# Patient Record
Sex: Female | Born: 1945
Health system: Southern US, Community
[De-identification: ages and names within clinical notes are randomized; demographics above are authoritative.]

## PROBLEM LIST (undated history)

## (undated) DIAGNOSIS — D229 Melanocytic nevi, unspecified: Secondary | ICD-10-CM

## (undated) DIAGNOSIS — C569 Malignant neoplasm of unspecified ovary: Secondary | ICD-10-CM

## (undated) DIAGNOSIS — C4492 Squamous cell carcinoma of skin, unspecified: Secondary | ICD-10-CM

## (undated) DIAGNOSIS — G589 Mononeuropathy, unspecified: Secondary | ICD-10-CM

## (undated) DIAGNOSIS — C4491 Basal cell carcinoma of skin, unspecified: Secondary | ICD-10-CM

## (undated) DIAGNOSIS — I509 Heart failure, unspecified: Secondary | ICD-10-CM

## (undated) DIAGNOSIS — R0600 Dyspnea, unspecified: Secondary | ICD-10-CM

## (undated) DIAGNOSIS — D040 Carcinoma in situ of skin of lip: Secondary | ICD-10-CM

## (undated) DIAGNOSIS — M8580 Other specified disorders of bone density and structure, unspecified site: Secondary | ICD-10-CM

## (undated) DIAGNOSIS — I2510 Atherosclerotic heart disease of native coronary artery without angina pectoris: Secondary | ICD-10-CM

## (undated) HISTORY — PX: OOPHORECTOMY: SHX86

## (undated) HISTORY — DX: Mononeuropathy, unspecified: G58.9

## (undated) HISTORY — DX: Malignant neoplasm of unspecified ovary: C56.9

## (undated) HISTORY — DX: Other specified disorders of bone density and structure, unspecified site: M85.80

## (undated) HISTORY — PX: OTHER SURGICAL HISTORY: SHX169

## (undated) HISTORY — DX: Squamous cell carcinoma of skin, unspecified: C44.92

## (undated) SURGERY — BRONCHOSCOPY, WITH FLUOROSCOPY
Anesthesia: Moderate Sedation

---
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
## 1988-07-23 DIAGNOSIS — C569 Malignant neoplasm of unspecified ovary: Secondary | ICD-10-CM

## 1988-07-23 HISTORY — PX: ABDOMINAL HYSTERECTOMY: SHX81

## 1988-07-23 HISTORY — DX: Malignant neoplasm of unspecified ovary: C56.9

## 1996-05-05 DIAGNOSIS — C4492 Squamous cell carcinoma of skin, unspecified: Secondary | ICD-10-CM

## 1996-05-05 DIAGNOSIS — C4491 Basal cell carcinoma of skin, unspecified: Secondary | ICD-10-CM

## 1996-05-05 HISTORY — DX: Basal cell carcinoma of skin, unspecified: C44.91

## 1996-05-05 HISTORY — DX: Squamous cell carcinoma of skin, unspecified: C44.92

## 1997-01-05 DIAGNOSIS — D229 Melanocytic nevi, unspecified: Secondary | ICD-10-CM

## 1997-01-05 HISTORY — DX: Melanocytic nevi, unspecified: D22.9

## 1999-09-11 ENCOUNTER — Other Ambulatory Visit: Admission: RE | Admit: 1999-09-11 | Discharge: 1999-09-11 | Payer: Self-pay | Admitting: Obstetrics and Gynecology

## 1999-10-12 ENCOUNTER — Encounter: Payer: Self-pay | Admitting: Obstetrics and Gynecology

## 1999-10-12 ENCOUNTER — Encounter: Admission: RE | Admit: 1999-10-12 | Discharge: 1999-10-12 | Payer: Self-pay | Admitting: Obstetrics and Gynecology

## 2000-07-09 ENCOUNTER — Encounter: Admission: RE | Admit: 2000-07-09 | Discharge: 2000-07-09 | Payer: Self-pay

## 2000-09-20 ENCOUNTER — Other Ambulatory Visit: Admission: RE | Admit: 2000-09-20 | Discharge: 2000-09-20 | Payer: Self-pay | Admitting: Obstetrics and Gynecology

## 2000-10-08 ENCOUNTER — Encounter: Admission: RE | Admit: 2000-10-08 | Discharge: 2000-10-08 | Payer: Self-pay | Admitting: Obstetrics and Gynecology

## 2000-10-08 ENCOUNTER — Encounter: Payer: Self-pay | Admitting: Obstetrics and Gynecology

## 2000-10-14 ENCOUNTER — Encounter: Admission: RE | Admit: 2000-10-14 | Discharge: 2000-10-14 | Payer: Self-pay | Admitting: Obstetrics and Gynecology

## 2000-10-14 ENCOUNTER — Encounter: Payer: Self-pay | Admitting: Obstetrics and Gynecology

## 2001-09-22 ENCOUNTER — Other Ambulatory Visit: Admission: RE | Admit: 2001-09-22 | Discharge: 2001-09-22 | Payer: Self-pay | Admitting: Obstetrics and Gynecology

## 2001-10-15 ENCOUNTER — Encounter: Payer: Self-pay | Admitting: Obstetrics and Gynecology

## 2001-10-15 ENCOUNTER — Encounter: Admission: RE | Admit: 2001-10-15 | Discharge: 2001-10-15 | Payer: Self-pay | Admitting: Obstetrics and Gynecology

## 2001-10-17 ENCOUNTER — Encounter: Admission: RE | Admit: 2001-10-17 | Discharge: 2001-10-17 | Payer: Self-pay | Admitting: Obstetrics and Gynecology

## 2001-10-17 ENCOUNTER — Encounter: Payer: Self-pay | Admitting: Obstetrics and Gynecology

## 2002-04-14 ENCOUNTER — Encounter: Admission: RE | Admit: 2002-04-14 | Discharge: 2002-04-14 | Payer: Self-pay | Admitting: Obstetrics and Gynecology

## 2002-04-14 ENCOUNTER — Encounter: Payer: Self-pay | Admitting: Obstetrics and Gynecology

## 2002-09-29 ENCOUNTER — Other Ambulatory Visit: Admission: RE | Admit: 2002-09-29 | Discharge: 2002-09-29 | Payer: Self-pay | Admitting: Obstetrics and Gynecology

## 2002-11-13 ENCOUNTER — Encounter: Payer: Self-pay | Admitting: Obstetrics and Gynecology

## 2002-11-13 ENCOUNTER — Encounter: Admission: RE | Admit: 2002-11-13 | Discharge: 2002-11-13 | Payer: Self-pay | Admitting: Obstetrics and Gynecology

## 2002-11-27 ENCOUNTER — Encounter: Admission: RE | Admit: 2002-11-27 | Discharge: 2002-11-27 | Payer: Self-pay | Admitting: Obstetrics and Gynecology

## 2002-11-27 ENCOUNTER — Encounter (INDEPENDENT_AMBULATORY_CARE_PROVIDER_SITE_OTHER): Payer: Self-pay

## 2002-11-27 ENCOUNTER — Encounter: Payer: Self-pay | Admitting: Obstetrics and Gynecology

## 2002-11-27 HISTORY — PX: BREAST BIOPSY: SHX20

## 2003-08-17 DIAGNOSIS — C4492 Squamous cell carcinoma of skin, unspecified: Secondary | ICD-10-CM

## 2003-08-17 HISTORY — DX: Squamous cell carcinoma of skin, unspecified: C44.92

## 2003-08-20 ENCOUNTER — Ambulatory Visit (HOSPITAL_COMMUNITY): Admission: RE | Admit: 2003-08-20 | Discharge: 2003-08-20 | Payer: Self-pay | Admitting: Gastroenterology

## 2003-09-29 ENCOUNTER — Other Ambulatory Visit: Admission: RE | Admit: 2003-09-29 | Discharge: 2003-09-29 | Payer: Self-pay | Admitting: Obstetrics and Gynecology

## 2003-11-18 ENCOUNTER — Encounter: Admission: RE | Admit: 2003-11-18 | Discharge: 2003-11-18 | Payer: Self-pay | Admitting: Obstetrics and Gynecology

## 2003-11-25 ENCOUNTER — Encounter: Admission: RE | Admit: 2003-11-25 | Discharge: 2003-11-25 | Payer: Self-pay | Admitting: Obstetrics and Gynecology

## 2003-12-06 ENCOUNTER — Encounter (HOSPITAL_COMMUNITY): Admission: RE | Admit: 2003-12-06 | Discharge: 2004-03-05 | Payer: Self-pay | Admitting: Obstetrics and Gynecology

## 2004-03-17 DIAGNOSIS — C4491 Basal cell carcinoma of skin, unspecified: Secondary | ICD-10-CM

## 2004-03-17 HISTORY — DX: Basal cell carcinoma of skin, unspecified: C44.91

## 2004-10-02 ENCOUNTER — Other Ambulatory Visit: Admission: RE | Admit: 2004-10-02 | Discharge: 2004-10-02 | Payer: Self-pay | Admitting: Obstetrics and Gynecology

## 2004-11-24 ENCOUNTER — Encounter: Admission: RE | Admit: 2004-11-24 | Discharge: 2004-11-24 | Payer: Self-pay | Admitting: Obstetrics and Gynecology

## 2005-10-08 ENCOUNTER — Other Ambulatory Visit: Admission: RE | Admit: 2005-10-08 | Discharge: 2005-10-08 | Payer: Self-pay | Admitting: Obstetrics and Gynecology

## 2005-11-08 ENCOUNTER — Other Ambulatory Visit: Admission: RE | Admit: 2005-11-08 | Discharge: 2005-11-08 | Payer: Self-pay | Admitting: Ophthalmology

## 2005-11-26 ENCOUNTER — Encounter: Admission: RE | Admit: 2005-11-26 | Discharge: 2005-11-26 | Payer: Self-pay | Admitting: Obstetrics and Gynecology

## 2005-11-28 ENCOUNTER — Encounter: Admission: RE | Admit: 2005-11-28 | Discharge: 2005-11-28 | Payer: Self-pay | Admitting: Obstetrics and Gynecology

## 2006-01-01 ENCOUNTER — Ambulatory Visit (HOSPITAL_COMMUNITY): Admission: RE | Admit: 2006-01-01 | Discharge: 2006-01-01 | Payer: Self-pay | Admitting: Obstetrics and Gynecology

## 2006-10-17 ENCOUNTER — Other Ambulatory Visit: Admission: RE | Admit: 2006-10-17 | Discharge: 2006-10-17 | Payer: Self-pay | Admitting: Obstetrics and Gynecology

## 2006-11-28 ENCOUNTER — Encounter: Admission: RE | Admit: 2006-11-28 | Discharge: 2006-11-28 | Payer: Self-pay | Admitting: Obstetrics and Gynecology

## 2007-10-21 ENCOUNTER — Other Ambulatory Visit: Admission: RE | Admit: 2007-10-21 | Discharge: 2007-10-21 | Payer: Self-pay | Admitting: Obstetrics and Gynecology

## 2007-12-08 ENCOUNTER — Encounter: Admission: RE | Admit: 2007-12-08 | Discharge: 2007-12-08 | Payer: Self-pay | Admitting: Obstetrics and Gynecology

## 2008-10-26 ENCOUNTER — Other Ambulatory Visit: Admission: RE | Admit: 2008-10-26 | Discharge: 2008-10-26 | Payer: Self-pay | Admitting: Obstetrics and Gynecology

## 2008-10-26 ENCOUNTER — Ambulatory Visit: Payer: Self-pay | Admitting: Obstetrics and Gynecology

## 2008-10-26 ENCOUNTER — Encounter: Payer: Self-pay | Admitting: Obstetrics and Gynecology

## 2008-11-30 ENCOUNTER — Encounter (INDEPENDENT_AMBULATORY_CARE_PROVIDER_SITE_OTHER): Payer: Self-pay | Admitting: Gastroenterology

## 2008-11-30 ENCOUNTER — Ambulatory Visit (HOSPITAL_COMMUNITY): Admission: RE | Admit: 2008-11-30 | Discharge: 2008-11-30 | Payer: Self-pay | Admitting: Gastroenterology

## 2008-12-08 ENCOUNTER — Encounter: Admission: RE | Admit: 2008-12-08 | Discharge: 2008-12-08 | Payer: Self-pay | Admitting: Obstetrics and Gynecology

## 2009-01-07 ENCOUNTER — Ambulatory Visit (HOSPITAL_COMMUNITY): Admission: RE | Admit: 2009-01-07 | Discharge: 2009-01-07 | Payer: Self-pay | Admitting: Obstetrics and Gynecology

## 2009-01-12 ENCOUNTER — Ambulatory Visit (HOSPITAL_COMMUNITY): Admission: RE | Admit: 2009-01-12 | Discharge: 2009-01-12 | Payer: Self-pay | Admitting: Obstetrics and Gynecology

## 2009-01-14 ENCOUNTER — Ambulatory Visit (HOSPITAL_COMMUNITY): Admission: RE | Admit: 2009-01-14 | Discharge: 2009-01-14 | Payer: Self-pay | Admitting: Obstetrics and Gynecology

## 2009-10-31 ENCOUNTER — Ambulatory Visit: Payer: Self-pay | Admitting: Obstetrics and Gynecology

## 2009-10-31 ENCOUNTER — Other Ambulatory Visit: Admission: RE | Admit: 2009-10-31 | Discharge: 2009-10-31 | Payer: Self-pay | Admitting: Obstetrics and Gynecology

## 2009-12-09 ENCOUNTER — Encounter: Admission: RE | Admit: 2009-12-09 | Discharge: 2009-12-09 | Payer: Self-pay | Admitting: Obstetrics and Gynecology

## 2010-07-10 ENCOUNTER — Encounter
Admission: RE | Admit: 2010-07-10 | Discharge: 2010-07-10 | Payer: Self-pay | Source: Home / Self Care | Attending: Internal Medicine | Admitting: Internal Medicine

## 2010-10-24 ENCOUNTER — Other Ambulatory Visit: Payer: Self-pay | Admitting: Obstetrics and Gynecology

## 2010-10-24 DIAGNOSIS — Z1231 Encounter for screening mammogram for malignant neoplasm of breast: Secondary | ICD-10-CM

## 2010-10-30 LAB — CREATININE, SERUM
Creatinine, Ser: 0.84 mg/dL (ref 0.4–1.2)
GFR calc Af Amer: >60 mL/min (ref >60)
GFR calc non Af Amer: >60 mL/min (ref >60)

## 2010-11-02 ENCOUNTER — Encounter (INDEPENDENT_AMBULATORY_CARE_PROVIDER_SITE_OTHER): Payer: 59 | Admitting: Obstetrics and Gynecology

## 2010-11-02 ENCOUNTER — Other Ambulatory Visit: Payer: Self-pay | Admitting: Obstetrics and Gynecology

## 2010-11-02 ENCOUNTER — Other Ambulatory Visit (HOSPITAL_COMMUNITY)
Admission: RE | Admit: 2010-11-02 | Discharge: 2010-11-02 | Disposition: A | Discharge status: Home / Self Care | Payer: 59 | Source: Ambulatory Visit | Attending: Obstetrics and Gynecology | Admitting: Obstetrics and Gynecology

## 2010-11-02 DIAGNOSIS — M8580 Other specified disorders of bone density and structure, unspecified site: Secondary | ICD-10-CM

## 2010-11-02 DIAGNOSIS — Z01419 Encounter for gynecological examination (general) (routine) without abnormal findings: Secondary | ICD-10-CM

## 2010-11-02 DIAGNOSIS — C569 Malignant neoplasm of unspecified ovary: Secondary | ICD-10-CM

## 2010-11-02 DIAGNOSIS — Z124 Encounter for screening for malignant neoplasm of cervix: Secondary | ICD-10-CM | POA: Insufficient documentation

## 2010-12-05 NOTE — Op Note (Signed)
Barbara Schwartz, Barbara Schwartz              ACCOUNT NO.:  [id number]   MEDICAL RECORD NO.:  [id number]          PATIENT TYPE:  AMB   LOCATION:  ENDO                         FACILITY:  [facility]   PHYSICIAN:  Petra Kuba, M.D.    DATE OF BIRTH:  Aug 26, 1945   DATE OF PROCEDURE:  11/30/2008  DATE OF DISCHARGE:                               OPERATIVE REPORT   PROCEDURE:  Colonoscopy with biopsy.   INDICATIONS:  Patient with ovarian cancer due for colonic screening.  Consent was signed after risks, benefits, methods, options thoroughly  discussed multiple times in the past.   MEDICINES USED:  Fentanyl 75 mcg, Versed 7.5 mg.   PROCEDURE:  Rectal inspection is pertinent for small external  hemorrhoids.  Digital exam was negative.  The video pediatric  colonoscope was inserted.  We fairly easily advanced around the colon to  the cecum.  This did require some abdominal pressure but no position  changes.  No abnormality was seen on insertion.  Cecum was identified by  the appendiceal orifice and ileocecal valve.  The scope was slowly  withdrawn.  The prep was adequate.  There was some liquid stool that  required washing and suctioning.  On slow withdrawal through the colon,  a tiny mid ascending and mid descending polyp was seen and was cold  biopsied x2 and put in the same container.  No other abnormalities were  seen as we slowly withdrew back to the rectum.  Once back in the rectum,  anorectal pull-through and retroflexion confirmed some small  hemorrhoids.  Scope was straightened and readvanced a short ways up the  left side of the colon.  Air was suctioned, scope removed.  The patient  tolerated the procedure well.  There was no obvious immediate  complication.   ENDOSCOPIC DIAGNOSES:  1. Internal-external hemorrhoids.  2. Two tiny descending and ascending polyps status post cold biopsy.  3. Otherwise within normal limits to the cecum.   PLANS:  Await pathology.  Followup p.r.n.  Probably  repeat colon  screening in 5 years.           ______________________________  Petra Kuba, M.D.     MEM/MEDQ  D:  11/30/2008  T:  11/30/2008  Job:  [id number]   cc:   Reuel Boom L. Eda Paschal, M.D.  Fax: [phone]   Deirdre Peer. Polite, M.D.

## 2010-12-08 NOTE — Op Note (Signed)
NAME:  Barbara Schwartz, Barbara Schwartz                        ACCOUNT NO.:  [id number]   MEDICAL RECORD NO.:  [id number]                   PATIENT TYPE:  AMB   LOCATION:  ENDO                                 FACILITY:  [facility]   PHYSICIAN:  Petra Kuba, M.D.                 DATE OF BIRTH:  13-May-1946   DATE OF PROCEDURE:  08/20/2003  DATE OF DISCHARGE:                                 OPERATIVE REPORT   PROCEDURE:  Colonoscopy.   INDICATIONS:  A patient with a history of ovarian cancer at a young age, as  well as a family history of both ovarian and breast cancer.  Due for colonic  screening.  Consent was signed after risks, benefits, methods and options  were thoroughly discussed in the office.   MEDICATIONS USED:  1. Demerol 40 mg .  2. Versed 5 mg.   DESCRIPTION OF PROCEDURE:  Rectal inspection is pertinent for small external  hemorrhoids.  Digital examination was negative.  Pediatric video adjustable  colonoscope was inserted and fairly easily advanced around the colon to the  cecum.  This did require some abdominal pressure with no positional changes.  No obvious abnormality was seen on insertion.  The cecum was identified by  the appendiceal orifice and the ileocecal valve.  The scope was inserted a  short way in the terminal ileum, which was normal.  Photo documentation was  obtained.  The scope was slowly withdrawn.   The prep was adequate.  There was some liquid stool that required washing  and suctioning on slow withdrawal through the colon.  No abnormalities were  noted, specifically no polyps, tumors, masses, diverticula or other  abnormalities.  Once back in the rectum, anorectal pullback in retroflexion  confirmed some small hemorrhoids.  The scope was straightened and readvanced  a short way up the left side of the colon.  Air was suctioned and scope  removed.  The patient tolerated the procedure well, there were no obvious  immediate complication.   ENDOSCOPIC DIAGNOSES:  1.  Small internal hemorrhoids.  2. Otherwise within normal limits to the cecum and terminal ileum.   PLAN:  Daily rectals and guaiacs per Dr. Lovell Sheehan.  Happy to see him back  p.r.n.; otherwise repeat colonic screening in five years.  Consideration of  rectal colonoscopy widely available at this junction.                                               Petra Kuba, M.D.    MEM/MEDQ  D:  08/20/2003  T:  08/20/2003  Job:  [id number]   cc:   Lilla Shook, M.D.  [address]  [place]  [place] [address]  Fax: [phone]   Sigmund I. Patsi Sears, M.D.  [address]  [place]  [place] [address]  Fax: [phone]

## 2010-12-09 ENCOUNTER — Inpatient Hospital Stay (INDEPENDENT_AMBULATORY_CARE_PROVIDER_SITE_OTHER)
Admission: RE | Admit: 2010-12-09 | Discharge: 2010-12-09 | Disposition: A | Discharge status: Home / Self Care | Payer: 59 | Source: Ambulatory Visit | Attending: Family Medicine | Admitting: Family Medicine

## 2010-12-09 DIAGNOSIS — S0510XA Contusion of eyeball and orbital tissues, unspecified eye, initial encounter: Secondary | ICD-10-CM

## 2010-12-11 ENCOUNTER — Ambulatory Visit
Admission: RE | Admit: 2010-12-11 | Discharge: 2010-12-11 | Disposition: A | Discharge status: Home / Self Care | Payer: 59 | Source: Ambulatory Visit | Attending: Obstetrics and Gynecology | Admitting: Obstetrics and Gynecology

## 2010-12-11 DIAGNOSIS — Z1231 Encounter for screening mammogram for malignant neoplasm of breast: Secondary | ICD-10-CM

## 2010-12-11 DIAGNOSIS — M8580 Other specified disorders of bone density and structure, unspecified site: Secondary | ICD-10-CM

## 2010-12-15 ENCOUNTER — Other Ambulatory Visit: Payer: Self-pay | Admitting: Obstetrics and Gynecology

## 2010-12-15 DIAGNOSIS — Z803 Family history of malignant neoplasm of breast: Secondary | ICD-10-CM

## 2010-12-20 ENCOUNTER — Ambulatory Visit (HOSPITAL_COMMUNITY)
Admission: RE | Admit: 2010-12-20 | Discharge: 2010-12-20 | Disposition: A | Discharge status: Home / Self Care | Payer: 59 | Source: Ambulatory Visit | Attending: Obstetrics and Gynecology | Admitting: Obstetrics and Gynecology

## 2010-12-20 DIAGNOSIS — Z8543 Personal history of malignant neoplasm of ovary: Secondary | ICD-10-CM | POA: Insufficient documentation

## 2010-12-20 DIAGNOSIS — Z803 Family history of malignant neoplasm of breast: Secondary | ICD-10-CM | POA: Insufficient documentation

## 2010-12-20 LAB — CREATININE, SERUM
Creatinine, Ser: 0.73 mg/dL (ref 0.4–1.2)
GFR calc Af Amer: >60 mL/min (ref >60)
GFR calc non Af Amer: >60 mL/min (ref >60)

## 2010-12-20 MED ORDER — GADOBENATE DIMEGLUMINE 529 MG/ML IV SOLN
12.0000 mL | Freq: Once | INTRAVENOUS | Status: AC | PRN
  Administered 2010-12-20: 12 mL via INTRAVENOUS

## 2011-04-19 ENCOUNTER — Telehealth: Payer: Self-pay | Admitting: *Deleted

## 2011-04-19 MED ORDER — ESTRADIOL ACETATE 0.05 MG/24HR VA RING: 1.0000 | VAGINAL_RING | VAGINAL | Status: DC

## 2011-04-19 NOTE — Telephone Encounter (Signed)
Patient has requested to go back on the lower dose of Femring.  C/O sore breast and painful nipples.  Can we sent new rx for the lower dose?

## 2011-04-19 NOTE — Telephone Encounter (Signed)
Yes we can. The lower doses Femring 0.05 mg, 1 every 90 days.

## 2011-04-19 NOTE — Telephone Encounter (Signed)
rx sent. LM on patient's voice mail.

## 2011-05-01 ENCOUNTER — Telehealth: Payer: Self-pay | Admitting: *Deleted

## 2011-05-01 NOTE — Telephone Encounter (Signed)
Patient lm asking about [place] taking care of some paperwork for her prescription to be processed.  Called patient lm on vm to have her bring paperwork by and give instructions on what needs to be done.

## 2011-05-03 ENCOUNTER — Telehealth: Payer: Self-pay | Admitting: *Deleted

## 2011-05-03 NOTE — Telephone Encounter (Signed)
LM pt dropped something off regarding an appeal for Femring with her insurance. I left message to c/b I have a few questions. KW

## 2011-05-04 NOTE — Telephone Encounter (Signed)
Talked to patient working on Prior Auth now.

## 2011-05-16 ENCOUNTER — Telehealth: Payer: Self-pay | Admitting: *Deleted

## 2011-05-16 NOTE — Telephone Encounter (Signed)
Called patient to let her know that we sent in paperwork to do a prior auth on Femring but the prescription was denied by her insurance company.  Patient said she would come in to talk to Dr. Eda Paschal if she needed to change her meds because of cost.

## 2011-05-21 ENCOUNTER — Telehealth: Payer: Self-pay | Admitting: *Deleted

## 2011-05-21 DIAGNOSIS — N390 Urinary tract infection, site not specified: Secondary | ICD-10-CM

## 2011-05-21 NOTE — Telephone Encounter (Signed)
Lm for patient with details.  Order in pc

## 2011-05-21 NOTE — Telephone Encounter (Signed)
Yes. I would also be willing to see her as well.

## 2011-05-21 NOTE — Telephone Encounter (Signed)
Lm for patient to call

## 2011-05-21 NOTE — Telephone Encounter (Signed)
Patient called c/o painful urination and wanted to know if she would stop by with a u/a?

## 2011-05-22 ENCOUNTER — Other Ambulatory Visit: Payer: Medicare Other | Admitting: *Deleted

## 2011-05-22 ENCOUNTER — Ambulatory Visit (INDEPENDENT_AMBULATORY_CARE_PROVIDER_SITE_OTHER): Payer: Medicare Other | Admitting: Obstetrics and Gynecology

## 2011-05-22 DIAGNOSIS — N390 Urinary tract infection, site not specified: Secondary | ICD-10-CM

## 2011-05-22 MED ORDER — NITROFURANTOIN MONOHYD MACRO 100 MG PO CAPS: 100.0000 mg | ORAL_CAPSULE | Freq: Two times a day (BID) | ORAL | Status: AC

## 2011-05-22 NOTE — Progress Notes (Signed)
Patient came in today with a 10 day history of dysuria and suprapubic pressure. She had a very abnormal urinalysis with too many to count red blood cells and white blood cells. She was treated with Macrobid twice a day with food for 7 days. She was also given a prescription for Pyridium 200 mg tablets number 10. She will return in one week for followup urinalysis.

## 2011-05-25 ENCOUNTER — Telehealth: Payer: Self-pay

## 2011-05-25 NOTE — Telephone Encounter (Signed)
Patient called to say that she had to pay for generic Macrobid out of her pocket as pharm said it required prior auth.  She said ins co said if we would call them they could still auth it and it could go toward her ded. She asked me to do this and I did.  They said we will hear via fax in 24-72 hours regarding approval or not.  I informed patient of this and told her if she had not heard from someone by mid-week next week she could call and check on it.

## 2011-05-28 ENCOUNTER — Telehealth: Payer: Self-pay | Admitting: *Deleted

## 2011-05-28 NOTE — Telephone Encounter (Signed)
Informed patient we received prior auth on Macrobid and that was approved till 06/24/11.  Patient said she was going to inform the pharmacy.

## 2011-05-29 ENCOUNTER — Ambulatory Visit (INDEPENDENT_AMBULATORY_CARE_PROVIDER_SITE_OTHER): Payer: Medicare Other | Admitting: Obstetrics and Gynecology

## 2011-05-29 DIAGNOSIS — N390 Urinary tract infection, site not specified: Secondary | ICD-10-CM

## 2011-10-23 ENCOUNTER — Other Ambulatory Visit: Payer: Self-pay | Admitting: Obstetrics and Gynecology

## 2011-10-23 DIAGNOSIS — Z1231 Encounter for screening mammogram for malignant neoplasm of breast: Secondary | ICD-10-CM

## 2011-10-30 ENCOUNTER — Encounter: Payer: Self-pay | Admitting: Gynecology

## 2011-10-30 DIAGNOSIS — C569 Malignant neoplasm of unspecified ovary: Secondary | ICD-10-CM | POA: Insufficient documentation

## 2011-10-30 DIAGNOSIS — M8580 Other specified disorders of bone density and structure, unspecified site: Secondary | ICD-10-CM | POA: Insufficient documentation

## 2011-11-07 ENCOUNTER — Encounter: Payer: PRIVATE HEALTH INSURANCE | Admitting: Obstetrics and Gynecology

## 2011-11-19 ENCOUNTER — Ambulatory Visit (INDEPENDENT_AMBULATORY_CARE_PROVIDER_SITE_OTHER): Payer: Medicare Other | Admitting: Obstetrics and Gynecology

## 2011-11-19 ENCOUNTER — Other Ambulatory Visit (HOSPITAL_COMMUNITY)
Admission: RE | Admit: 2011-11-19 | Discharge: 2011-11-19 | Disposition: A | Discharge status: Home / Self Care | Payer: Medicare Other | Source: Ambulatory Visit | Attending: Obstetrics and Gynecology | Admitting: Obstetrics and Gynecology

## 2011-11-19 ENCOUNTER — Encounter: Payer: Self-pay | Admitting: Obstetrics and Gynecology

## 2011-11-19 VITALS — BP 120/74 | Ht 65.0 in | Wt 123.0 lb

## 2011-11-19 DIAGNOSIS — C569 Malignant neoplasm of unspecified ovary: Secondary | ICD-10-CM

## 2011-11-19 DIAGNOSIS — N949 Unspecified condition associated with female genital organs and menstrual cycle: Secondary | ICD-10-CM | POA: Diagnosis not present

## 2011-11-19 DIAGNOSIS — R102 Pelvic and perineal pain unspecified side: Secondary | ICD-10-CM

## 2011-11-19 DIAGNOSIS — M8580 Other specified disorders of bone density and structure, unspecified site: Secondary | ICD-10-CM

## 2011-11-19 DIAGNOSIS — N952 Postmenopausal atrophic vaginitis: Secondary | ICD-10-CM | POA: Diagnosis not present

## 2011-11-19 DIAGNOSIS — M899 Disorder of bone, unspecified: Secondary | ICD-10-CM

## 2011-11-19 DIAGNOSIS — Z01419 Encounter for gynecological examination (general) (routine) without abnormal findings: Secondary | ICD-10-CM | POA: Insufficient documentation

## 2011-11-19 DIAGNOSIS — R350 Frequency of micturition: Secondary | ICD-10-CM

## 2011-11-19 MED ORDER — ESTRADIOL ACETATE 0.05 MG/24HR VA RING: 1.0000 | VAGINAL_RING | VAGINAL | Status: DC

## 2011-11-19 NOTE — Progress Notes (Signed)
Patient came to see me today for further followup. She continues to use an Estring for  vaginal dryness with excellent results. She is having no real hot flashes. She is having no vaginal bleeding. She is having lower abdominal discomfort. It is worse in the mornings and gets better as the day goes on. She has generalized aches and pains but no one has ever made it diagnosis of arthritis. She gets a feeling that this is what this is. She thinks it's more noticeable when she moves. She is having no nausea, vomiting, change in bowel habits. She sees Dr. Ewing Schlein and is up-to-date on colonoscopies. She has osteopenia on drug holiday. Her osteopenia is stable. She has had no fractures. She is now 23 years out from definitive surgery and chemotherapy for stage III ovarian cancer. She continues with yearly mammograms.  HEENT: Within normal limits. Kiom Gardner present. Neck: No masses. Supraclavicular lymph nodes: Not enlarged. Breasts: Examined in both sitting and lying position. Symmetrical without skin changes or masses. Abdomen: Soft no masses guarding or rebound. No hernias. Pelvic: External within normal limits. BUS within normal limits. Vaginal examination shows good estrogen effect, no cystocele enterocele or rectocele. Cervix and uterus absent. Adnexa within normal limits. I can reproduce patient's discomfort when I press on her pelvic bones as opposed to normal pelvic exam. Rectovaginal confirmatory. Extremities within normal limits.  Assessment: #1. Ovarian cancer and 2. Osteopenia #3. Pelvic discomfort-arthritis suspected #4. Atrophic vaginitis  Plan: Mammogram. Continue periodic bone densities. CA 125 drawn. Based on that we will probably do pelvic flatplate looking for osteoarthritis of pelvic bones. Continue estrogen vaginal ring.

## 2011-11-20 LAB — URINALYSIS W MICROSCOPIC + REFLEX CULTURE
Bacteria, UA: NONE SEEN
Bilirubin Urine: NEGATIVE
Casts: NONE SEEN
Crystals: NONE SEEN
Glucose, UA: NEGATIVE mg/dL
Hgb urine dipstick: NEGATIVE
Ketones, ur: NEGATIVE mg/dL
Leukocytes, UA: NEGATIVE
Nitrite: NEGATIVE
Protein, ur: NEGATIVE mg/dL
Specific Gravity, Urine: 1.023 (ref 1.005–1.030)
Urobilinogen, UA: 0.2 mg/dL (ref 0.0–1.0)
pH: 6.5 (ref 5.0–8.0)

## 2011-11-20 LAB — CA 125: CA 125: 10.1 U/mL (ref 0.0–30.2)

## 2011-12-03 DIAGNOSIS — D049 Carcinoma in situ of skin, unspecified: Secondary | ICD-10-CM | POA: Diagnosis not present

## 2011-12-03 DIAGNOSIS — L570 Actinic keratosis: Secondary | ICD-10-CM | POA: Diagnosis not present

## 2011-12-03 DIAGNOSIS — Z85828 Personal history of other malignant neoplasm of skin: Secondary | ICD-10-CM | POA: Diagnosis not present

## 2011-12-12 ENCOUNTER — Ambulatory Visit: Payer: PRIVATE HEALTH INSURANCE

## 2011-12-12 ENCOUNTER — Ambulatory Visit
Admission: RE | Admit: 2011-12-12 | Discharge: 2011-12-12 | Disposition: A | Discharge status: Home / Self Care | Payer: Medicare Other | Source: Ambulatory Visit | Attending: Obstetrics and Gynecology | Admitting: Obstetrics and Gynecology

## 2011-12-12 DIAGNOSIS — Z1231 Encounter for screening mammogram for malignant neoplasm of breast: Secondary | ICD-10-CM

## 2011-12-24 ENCOUNTER — Telehealth: Payer: Self-pay | Admitting: *Deleted

## 2011-12-24 NOTE — Telephone Encounter (Signed)
Pt left message on voicemail regarding breast MRI? Left for pt to call.

## 2011-12-25 ENCOUNTER — Telehealth: Payer: Self-pay | Admitting: *Deleted

## 2011-12-25 NOTE — Telephone Encounter (Signed)
Pt would like to know if you want MRI of breast due to high risk breast cancer history? She has done this done in the past, mammogram was done and reported normal for this year. Please advise

## 2011-12-25 NOTE — Telephone Encounter (Signed)
I just saw that she had an MRI last year. Please call Dr. Anselmo Pickler and ask him if she should do it  yearly or every other year.

## 2011-12-25 NOTE — Telephone Encounter (Signed)
I think it is a good idea. Normally people do it every other year. Does she remember when she last visit? If it's been 2 years she should do it. If she's not sure if you look in her paper chart you should find  the date of the last MRI.

## 2011-12-26 NOTE — Telephone Encounter (Signed)
Spoke with Dr. Jean Rosenthal regarding the below and she suggested by have this done yearly. Place order for MRI?

## 2011-12-26 NOTE — Telephone Encounter (Signed)
yes

## 2011-12-26 NOTE — Telephone Encounter (Signed)
MRI will be schedule, will let amy know.

## 2011-12-26 NOTE — Telephone Encounter (Signed)
Left message for doctor to call.

## 2011-12-31 ENCOUNTER — Telehealth: Payer: Self-pay | Admitting: *Deleted

## 2011-12-31 NOTE — Telephone Encounter (Signed)
Spoke with Olegario Messier at [facility].

## 2011-12-31 NOTE — Telephone Encounter (Signed)
Pt called to follow up regarding MRI breast, amy spoke with Olegario Messier and we are waiting for order to be faxed for G to sign.

## 2012-01-01 ENCOUNTER — Other Ambulatory Visit: Payer: Self-pay | Admitting: Obstetrics and Gynecology

## 2012-01-01 DIAGNOSIS — Z803 Family history of malignant neoplasm of breast: Secondary | ICD-10-CM

## 2012-01-10 ENCOUNTER — Ambulatory Visit
Admission: RE | Admit: 2012-01-10 | Discharge: 2012-01-10 | Disposition: A | Discharge status: Home / Self Care | Payer: Medicare Other | Source: Ambulatory Visit | Attending: Obstetrics and Gynecology | Admitting: Obstetrics and Gynecology

## 2012-01-10 DIAGNOSIS — Z803 Family history of malignant neoplasm of breast: Secondary | ICD-10-CM | POA: Diagnosis not present

## 2012-01-10 DIAGNOSIS — Z8543 Personal history of malignant neoplasm of ovary: Secondary | ICD-10-CM | POA: Diagnosis not present

## 2012-01-10 MED ORDER — GADOBENATE DIMEGLUMINE 529 MG/ML IV SOLN
11.0000 mL | Freq: Once | INTRAVENOUS | Status: AC | PRN
  Administered 2012-01-10: 11 mL via INTRAVENOUS

## 2012-07-07 DIAGNOSIS — Z0000 Encounter for general adult medical examination without abnormal findings: Secondary | ICD-10-CM | POA: Diagnosis not present

## 2012-07-07 DIAGNOSIS — M899 Disorder of bone, unspecified: Secondary | ICD-10-CM | POA: Diagnosis not present

## 2012-07-07 DIAGNOSIS — C569 Malignant neoplasm of unspecified ovary: Secondary | ICD-10-CM | POA: Diagnosis not present

## 2012-07-07 DIAGNOSIS — E785 Hyperlipidemia, unspecified: Secondary | ICD-10-CM | POA: Diagnosis not present

## 2012-07-07 DIAGNOSIS — M949 Disorder of cartilage, unspecified: Secondary | ICD-10-CM | POA: Diagnosis not present

## 2012-07-09 DIAGNOSIS — E875 Hyperkalemia: Secondary | ICD-10-CM | POA: Diagnosis not present

## 2012-09-05 DIAGNOSIS — H02059 Trichiasis without entropian unspecified eye, unspecified eyelid: Secondary | ICD-10-CM | POA: Diagnosis not present

## 2012-10-21 ENCOUNTER — Other Ambulatory Visit: Payer: Self-pay

## 2012-10-21 DIAGNOSIS — Z1231 Encounter for screening mammogram for malignant neoplasm of breast: Secondary | ICD-10-CM

## 2012-11-19 ENCOUNTER — Encounter: Payer: Self-pay | Admitting: Gynecology

## 2012-11-19 ENCOUNTER — Ambulatory Visit (INDEPENDENT_AMBULATORY_CARE_PROVIDER_SITE_OTHER): Payer: Medicare Other | Admitting: Gynecology

## 2012-11-19 ENCOUNTER — Other Ambulatory Visit (HOSPITAL_COMMUNITY)
Admission: RE | Admit: 2012-11-19 | Discharge: 2012-11-19 | Disposition: A | Discharge status: Home / Self Care | Payer: Medicare Other | Source: Ambulatory Visit | Attending: Gynecology | Admitting: Gynecology

## 2012-11-19 VITALS — BP 110/74 | Ht 65.0 in | Wt 122.0 lb

## 2012-11-19 DIAGNOSIS — M899 Disorder of bone, unspecified: Secondary | ICD-10-CM | POA: Diagnosis not present

## 2012-11-19 DIAGNOSIS — N952 Postmenopausal atrophic vaginitis: Secondary | ICD-10-CM

## 2012-11-19 DIAGNOSIS — Z79890 Hormone replacement therapy (postmenopausal): Secondary | ICD-10-CM

## 2012-11-19 DIAGNOSIS — Z124 Encounter for screening for malignant neoplasm of cervix: Secondary | ICD-10-CM | POA: Insufficient documentation

## 2012-11-19 DIAGNOSIS — C569 Malignant neoplasm of unspecified ovary: Secondary | ICD-10-CM | POA: Diagnosis not present

## 2012-11-19 DIAGNOSIS — M949 Disorder of cartilage, unspecified: Secondary | ICD-10-CM | POA: Diagnosis not present

## 2012-11-19 DIAGNOSIS — M8580 Other specified disorders of bone density and structure, unspecified site: Secondary | ICD-10-CM

## 2012-11-19 MED ORDER — ESTRADIOL ACETATE 0.05 MG/24HR VA RING: 1.0000 | VAGINAL_RING | VAGINAL | Status: DC

## 2012-11-19 NOTE — Patient Instructions (Signed)
Follow up in one year for annual exam 

## 2012-11-19 NOTE — Progress Notes (Signed)
Barbara Schwartz [birth year] [id number]        67 y.o.  G2P1011 for followup exam.  Several issues noted below. Former patient of Dr. Eda Paschal  Past medical history,surgical history, medications, allergies, family history and social history were all reviewed and documented in the EPIC chart. ROS:  Was performed and pertinent positives and negatives are included in the history.  Exam: Kim assistant Filed Vitals:   11/19/12 1408  BP: 110/74  Height: 5\' 5"  (1.651 m)  Weight: 122 lb (55.339 kg)   General appearance  Normal Skin grossly normal Head/Neck normal with no cervical or supraclavicular adenopathy thyroid normal Lungs  clear Cardiac RR, without RMG Abdominal  soft, nontender, without masses, organomegaly or hernia Breasts  examined lying and sitting without masses, retractions, discharge or axillary adenopathy. Pelvic  Ext/BUS/vagina  normal with atrophic changes  Adnexa  Without masses or tenderness    Anus and perineum  normal   Rectovaginal  normal sphincter tone without palpated masses or tenderness.    Assessment/Plan:  67 y.o. G94P1011 female for followup exam.   1. Ovarian cancer stage III 1990 status post TAH/BSO. Has done well with and NED.  Has been doing annual CA 125's and this was ordered today. 2. Atrophic vaginitis. Using Femring with good results primarily for bladder symptoms. Not having menopausal symptoms such as hot flashes night sweats. Risks of estrogen to include stroke heart attack DVT breast cancer reviewed. Refilled Femring. 3. Osteopenia. DEXA 11/2010 T score -1.9. Patient had been on Neva for 5 years and discontinued then. Recommend repeat bone density in another year or 2. Increase calcium vitamin D. 4. Mammography 11/2011. Coming due for mammography now. Also does MRI given history of premenopausal breast cancer in her mother, maternal grandmother and maternal great aunt and her personal history of ovarian cancer. Has been counseled in the past for  genetic testing and she declines. I reviewed the issues that if she was positive the benefits of prophylactic mastectomy as well as the benefits to her children to be screened. Patient continues to decline. I do think it's reasonable to do MRI given her strong history for a screening and she will arrange for this. SBE monthly review. 5. Pap smear 2013. Pap done today. Does have history of a brain cancer but unsure that this would necessitate continuing Pap smear status post hysterectomy. Options to stop screaming versus continuing reviewed. At this point we'll plan on continuing screening. 6. Colonoscopy 2010. Repeated there recommended interval. 7. Health maintenance. No other blood work done as this is all done through her primary physician's office. Followup one year, sooner as needed.    Dara Lords MD, 2:48 PM 11/19/2012

## 2012-11-20 LAB — URINALYSIS W MICROSCOPIC + REFLEX CULTURE
Bacteria, UA: NONE SEEN
Bilirubin Urine: NEGATIVE
Casts: NONE SEEN
Crystals: NONE SEEN
Glucose, UA: NEGATIVE mg/dL
Hgb urine dipstick: NEGATIVE
Leukocytes, UA: NEGATIVE
Nitrite: NEGATIVE
Protein, ur: NEGATIVE mg/dL
Specific Gravity, Urine: 1.022 (ref 1.005–1.030)
Urobilinogen, UA: 0.2 mg/dL (ref 0.0–1.0)
pH: 5.5 (ref 5.0–8.0)

## 2012-11-20 LAB — CA 125: CA 125: 7.1 U/mL (ref 0.0–30.2)

## 2012-12-03 ENCOUNTER — Other Ambulatory Visit: Payer: Self-pay | Admitting: Dermatology

## 2012-12-03 DIAGNOSIS — D485 Neoplasm of uncertain behavior of skin: Secondary | ICD-10-CM | POA: Diagnosis not present

## 2012-12-03 DIAGNOSIS — D0470 Carcinoma in situ of skin of unspecified lower limb, including hip: Secondary | ICD-10-CM | POA: Diagnosis not present

## 2012-12-03 DIAGNOSIS — C44519 Basal cell carcinoma of skin of other part of trunk: Secondary | ICD-10-CM | POA: Diagnosis not present

## 2012-12-03 DIAGNOSIS — L821 Other seborrheic keratosis: Secondary | ICD-10-CM | POA: Diagnosis not present

## 2012-12-03 DIAGNOSIS — D0430 Carcinoma in situ of skin of unspecified part of face: Secondary | ICD-10-CM | POA: Diagnosis not present

## 2012-12-03 DIAGNOSIS — D0439 Carcinoma in situ of skin of other parts of face: Secondary | ICD-10-CM | POA: Diagnosis not present

## 2012-12-03 DIAGNOSIS — L570 Actinic keratosis: Secondary | ICD-10-CM | POA: Diagnosis not present

## 2012-12-12 ENCOUNTER — Ambulatory Visit
Admission: RE | Admit: 2012-12-12 | Discharge: 2012-12-12 | Disposition: A | Discharge status: Home / Self Care | Payer: Medicare Other | Source: Ambulatory Visit

## 2012-12-12 DIAGNOSIS — Z1231 Encounter for screening mammogram for malignant neoplasm of breast: Secondary | ICD-10-CM | POA: Diagnosis not present

## 2012-12-30 ENCOUNTER — Telehealth: Payer: Self-pay | Admitting: *Deleted

## 2012-12-30 DIAGNOSIS — Z803 Family history of malignant neoplasm of breast: Secondary | ICD-10-CM

## 2012-12-30 DIAGNOSIS — Z8543 Personal history of malignant neoplasm of ovary: Secondary | ICD-10-CM

## 2012-12-30 NOTE — Telephone Encounter (Signed)
Pt called requesting order for mri of breast due to family history of breast cancer, and pt has had ovarian cancer. Order placed. Spoke with Cordelia Pen at [facility] and they will contact pt to scheduled MRI. Pt informed with this a well.

## 2013-01-07 ENCOUNTER — Ambulatory Visit
Admission: RE | Admit: 2013-01-07 | Discharge: 2013-01-07 | Disposition: A | Discharge status: Home / Self Care | Payer: Medicare Other | Source: Ambulatory Visit | Attending: Gynecology | Admitting: Gynecology

## 2013-01-07 DIAGNOSIS — Z8543 Personal history of malignant neoplasm of ovary: Secondary | ICD-10-CM

## 2013-01-07 DIAGNOSIS — Z803 Family history of malignant neoplasm of breast: Secondary | ICD-10-CM

## 2013-01-07 DIAGNOSIS — R928 Other abnormal and inconclusive findings on diagnostic imaging of breast: Secondary | ICD-10-CM | POA: Diagnosis not present

## 2013-01-07 MED ORDER — GADOBENATE DIMEGLUMINE 529 MG/ML IV SOLN
11.0000 mL | Freq: Once | INTRAVENOUS | Status: AC | PRN
  Administered 2013-01-07: 11 mL via INTRAVENOUS

## 2013-01-19 ENCOUNTER — Encounter: Payer: Self-pay | Admitting: Gynecology

## 2013-01-19 ENCOUNTER — Telehealth: Payer: Self-pay | Admitting: *Deleted

## 2013-01-19 NOTE — Telephone Encounter (Signed)
PT CALLED REQUESTING  RECENT RESULTS FROM MRI OF BREAST. PLEASE ADVISE

## 2013-01-19 NOTE — Telephone Encounter (Signed)
Tell patient that the MRI was negative

## 2013-01-19 NOTE — Telephone Encounter (Signed)
PT INFORMED WITH THE BELOW 

## 2013-01-21 DIAGNOSIS — C44519 Basal cell carcinoma of skin of other part of trunk: Secondary | ICD-10-CM | POA: Diagnosis not present

## 2013-01-21 DIAGNOSIS — D0430 Carcinoma in situ of skin of unspecified part of face: Secondary | ICD-10-CM | POA: Diagnosis not present

## 2013-01-21 DIAGNOSIS — D0439 Carcinoma in situ of skin of other parts of face: Secondary | ICD-10-CM | POA: Diagnosis not present

## 2013-01-21 DIAGNOSIS — D0470 Carcinoma in situ of skin of unspecified lower limb, including hip: Secondary | ICD-10-CM | POA: Diagnosis not present

## 2013-02-03 DIAGNOSIS — H903 Sensorineural hearing loss, bilateral: Secondary | ICD-10-CM | POA: Diagnosis not present

## 2013-02-25 ENCOUNTER — Other Ambulatory Visit: Payer: Self-pay

## 2013-05-18 ENCOUNTER — Telehealth: Payer: Self-pay | Admitting: *Deleted

## 2013-05-18 NOTE — Telephone Encounter (Signed)
Several different options. Probably be better discussing at office visit

## 2013-05-18 NOTE — Telephone Encounter (Signed)
Left on voicemail to make OV. 

## 2013-05-18 NOTE — Telephone Encounter (Signed)
Pt femring has increase and is too expensive, she asked what other options does she have? OV? Please advise

## 2013-05-25 DIAGNOSIS — L821 Other seborrheic keratosis: Secondary | ICD-10-CM | POA: Diagnosis not present

## 2013-05-25 DIAGNOSIS — D239 Other benign neoplasm of skin, unspecified: Secondary | ICD-10-CM | POA: Diagnosis not present

## 2013-05-25 DIAGNOSIS — L570 Actinic keratosis: Secondary | ICD-10-CM | POA: Diagnosis not present

## 2013-05-28 ENCOUNTER — Other Ambulatory Visit: Payer: Self-pay

## 2013-06-11 DIAGNOSIS — Z23 Encounter for immunization: Secondary | ICD-10-CM | POA: Diagnosis not present

## 2013-06-16 DIAGNOSIS — L259 Unspecified contact dermatitis, unspecified cause: Secondary | ICD-10-CM | POA: Diagnosis not present

## 2013-06-16 DIAGNOSIS — L089 Local infection of the skin and subcutaneous tissue, unspecified: Secondary | ICD-10-CM | POA: Diagnosis not present

## 2013-07-08 ENCOUNTER — Ambulatory Visit
Admission: RE | Admit: 2013-07-08 | Discharge: 2013-07-08 | Disposition: A | Discharge status: Home / Self Care | Payer: Medicare Other | Source: Ambulatory Visit | Attending: Internal Medicine | Admitting: Internal Medicine

## 2013-07-08 ENCOUNTER — Other Ambulatory Visit: Payer: Self-pay | Admitting: Internal Medicine

## 2013-07-08 DIAGNOSIS — M899 Disorder of bone, unspecified: Secondary | ICD-10-CM | POA: Diagnosis not present

## 2013-07-08 DIAGNOSIS — N949 Unspecified condition associated with female genital organs and menstrual cycle: Secondary | ICD-10-CM

## 2013-07-08 DIAGNOSIS — M545 Low back pain, unspecified: Secondary | ICD-10-CM

## 2013-07-08 DIAGNOSIS — M25559 Pain in unspecified hip: Secondary | ICD-10-CM | POA: Diagnosis not present

## 2013-07-08 DIAGNOSIS — M549 Dorsalgia, unspecified: Secondary | ICD-10-CM | POA: Diagnosis not present

## 2013-07-20 ENCOUNTER — Other Ambulatory Visit: Payer: Self-pay | Admitting: Internal Medicine

## 2013-07-20 DIAGNOSIS — M545 Low back pain, unspecified: Secondary | ICD-10-CM | POA: Diagnosis not present

## 2013-07-20 DIAGNOSIS — N949 Unspecified condition associated with female genital organs and menstrual cycle: Secondary | ICD-10-CM | POA: Diagnosis not present

## 2013-07-20 DIAGNOSIS — M899 Disorder of bone, unspecified: Secondary | ICD-10-CM | POA: Diagnosis not present

## 2013-07-23 DIAGNOSIS — M8580 Other specified disorders of bone density and structure, unspecified site: Secondary | ICD-10-CM

## 2013-07-23 HISTORY — DX: Other specified disorders of bone density and structure, unspecified site: M85.80

## 2013-07-25 ENCOUNTER — Ambulatory Visit
Admission: RE | Admit: 2013-07-25 | Discharge: 2013-07-25 | Disposition: A | Discharge status: Home / Self Care | Payer: Medicare Other | Source: Ambulatory Visit | Attending: Internal Medicine | Admitting: Internal Medicine

## 2013-07-25 DIAGNOSIS — M545 Low back pain, unspecified: Secondary | ICD-10-CM

## 2013-07-25 DIAGNOSIS — M169 Osteoarthritis of hip, unspecified: Secondary | ICD-10-CM | POA: Diagnosis not present

## 2013-07-25 DIAGNOSIS — M76899 Other specified enthesopathies of unspecified lower limb, excluding foot: Secondary | ICD-10-CM | POA: Diagnosis not present

## 2013-07-25 DIAGNOSIS — M47817 Spondylosis without myelopathy or radiculopathy, lumbosacral region: Secondary | ICD-10-CM | POA: Diagnosis not present

## 2013-07-25 DIAGNOSIS — M5137 Other intervertebral disc degeneration, lumbosacral region: Secondary | ICD-10-CM | POA: Diagnosis not present

## 2013-07-25 DIAGNOSIS — M1610 Unilateral primary osteoarthritis, unspecified hip: Secondary | ICD-10-CM | POA: Diagnosis not present

## 2013-07-30 DIAGNOSIS — M899 Disorder of bone, unspecified: Secondary | ICD-10-CM | POA: Diagnosis not present

## 2013-07-30 DIAGNOSIS — D126 Benign neoplasm of colon, unspecified: Secondary | ICD-10-CM | POA: Diagnosis not present

## 2013-07-30 DIAGNOSIS — M549 Dorsalgia, unspecified: Secondary | ICD-10-CM | POA: Diagnosis not present

## 2013-07-30 DIAGNOSIS — E785 Hyperlipidemia, unspecified: Secondary | ICD-10-CM | POA: Diagnosis not present

## 2013-09-07 DIAGNOSIS — M949 Disorder of cartilage, unspecified: Secondary | ICD-10-CM | POA: Diagnosis not present

## 2013-09-07 DIAGNOSIS — M899 Disorder of bone, unspecified: Secondary | ICD-10-CM | POA: Diagnosis not present

## 2013-10-26 DIAGNOSIS — Z09 Encounter for follow-up examination after completed treatment for conditions other than malignant neoplasm: Secondary | ICD-10-CM | POA: Diagnosis not present

## 2013-10-26 DIAGNOSIS — Z86010 Personal history of colonic polyps: Secondary | ICD-10-CM | POA: Diagnosis not present

## 2013-11-03 ENCOUNTER — Other Ambulatory Visit: Payer: Self-pay

## 2013-11-03 DIAGNOSIS — Z1231 Encounter for screening mammogram for malignant neoplasm of breast: Secondary | ICD-10-CM

## 2013-11-18 ENCOUNTER — Encounter: Payer: Self-pay | Admitting: Gynecology

## 2013-11-20 ENCOUNTER — Ambulatory Visit (INDEPENDENT_AMBULATORY_CARE_PROVIDER_SITE_OTHER): Payer: Medicare Other | Admitting: Gynecology

## 2013-11-20 ENCOUNTER — Other Ambulatory Visit (HOSPITAL_COMMUNITY)
Admission: RE | Admit: 2013-11-20 | Discharge: 2013-11-20 | Disposition: A | Discharge status: Home / Self Care | Payer: Medicare Other | Source: Ambulatory Visit | Attending: Gynecology | Admitting: Gynecology

## 2013-11-20 ENCOUNTER — Encounter: Payer: Self-pay | Admitting: Gynecology

## 2013-11-20 VITALS — BP 122/70 | Ht 65.0 in | Wt 121.0 lb

## 2013-11-20 DIAGNOSIS — Z9189 Other specified personal risk factors, not elsewhere classified: Secondary | ICD-10-CM

## 2013-11-20 DIAGNOSIS — Z124 Encounter for screening for malignant neoplasm of cervix: Secondary | ICD-10-CM | POA: Diagnosis not present

## 2013-11-20 DIAGNOSIS — M899 Disorder of bone, unspecified: Secondary | ICD-10-CM

## 2013-11-20 DIAGNOSIS — C569 Malignant neoplasm of unspecified ovary: Secondary | ICD-10-CM

## 2013-11-20 DIAGNOSIS — M949 Disorder of cartilage, unspecified: Secondary | ICD-10-CM

## 2013-11-20 DIAGNOSIS — Z1272 Encounter for screening for malignant neoplasm of vagina: Secondary | ICD-10-CM

## 2013-11-20 DIAGNOSIS — N952 Postmenopausal atrophic vaginitis: Secondary | ICD-10-CM

## 2013-11-20 DIAGNOSIS — M8580 Other specified disorders of bone density and structure, unspecified site: Secondary | ICD-10-CM

## 2013-11-20 MED ORDER — ESTRADIOL 10 MCG VA TABS: 1.0000 | ORAL_TABLET | VAGINAL | Status: DC

## 2013-11-20 NOTE — Progress Notes (Signed)
Barbara Schwartz 1946/05/21 [id number]        68 y.o.  G2P1011 for followup exam.  Several issues noted below.  Past medical history,surgical history, problem list, medications, allergies, family history and social history were all reviewed and documented as reviewed in the EPIC chart.  ROS:  12 system ROS performed with pertinent positives and negatives included in the history, assessment and plan.  Included Systems: General, HEENT, Neck, Cardiovascular, Pulmonary, Gastrointestinal, Genitourinary, Musculoskeletal, Dermatologic, Endocrine, Hematological, Neurologic, Psychiatric Additional significant findings : None   Exam: Kim assistant Filed Vitals:   11/20/13 1401  BP: 122/70  Height: 5\' 5"  (1.651 m)  Weight: 121 lb (54.885 kg)   General appearance:  Normal affect, orientation and appearance. Skin: Grossly normal HEENT: Without gross lesions.  No cervical or supraclavicular adenopathy. Thyroid normal.  Lungs:  Clear without wheezing, rales or rhonchi Cardiac: RR, without RMG Abdominal:  Soft, nontender, without masses, guarding, rebound, organomegaly or hernia Breasts:  Examined lying and sitting without masses, retractions, discharge or axillary adenopathy. Pelvic:  Ext/BUS/vagina with generalized atrophic changes. Pap of cuff done  Adnexa  Without masses or tenderness    Anus and perineum  Normal   Rectovaginal  Normal sphincter tone without palpated masses or tenderness.    Assessment/Plan:  68 y.o. G76P1011 female for followup exam.   1. Postmenopausal/atrophic genital changes. Patient continues with membrane for bladder/vaginal support. We have discussed the risks to include thrombosis such as stroke heart attack DVT and breast cancer. She's complaining of the cost of the membrane and I suggested a trial of Vagifem 10 mcg twice weekly. Patient wants to go ahead and try this I prescribed this x1 year. Patient will followup if she has any issues. 2. Ovarian cancer, stage III  1990 status post TAH/BSO. Exam NED does annual CA 125's and this was ordered today. 3. Osteopenia. DEXA 2012 with T score -1.9. Patient had repeat DEXA January 2015 at her other physician's office. She was told that it was okay. She had been on Boniva for 5 years and is on a drug-free holiday now. I asked her to get a copy sent to me. Increased calcium vitamin D reviewed. 4. Pap smear 2014. Pap smear done again today given her history of ovarian cancer. 5. Mammography 11/2012. Has mammogram scheduled this month. Is also doing MRIs due to her strong family history of premenopausal breast cancer in her mother, maternal grandmother and maternal great-aunt in her personal history of ovarian cancer. She had been counseled for genetic testing and declined. I reviewed this with her as well as Dr. Cherylann Banas in the past and she is not interested. She will call when she is due for her MRI for [place] to schedule it. SBE monthly reviewed. 6. Colonoscopy 2015. Repeat at their recommended interval. 7. Health maintenance. No routine blood work done as this is all done through her primary physician's office. Followup in one year, sooner if issues with Vagifem.   Note: This document was prepared with digital dictation and possible smart phrase technology. Any transcriptional errors that result from this process are unintentional.   [name], 2:37 PM 11/20/2013

## 2013-11-20 NOTE — Patient Instructions (Signed)
Try the Vagifem intravaginal tablets that we discussed twice weekly. Call me in followup if you any questions or issues. Call when due for your MRI. Followup in one year for annual exam.

## 2013-11-20 NOTE — Addendum Note (Signed)
Addended by: Nelva Nay on: 11/20/2013 02:49 PM   Modules accepted: Orders

## 2013-11-21 LAB — URINALYSIS W MICROSCOPIC + REFLEX CULTURE
Bacteria, UA: NONE SEEN
Bilirubin Urine: NEGATIVE
Casts: NONE SEEN
Crystals: NONE SEEN
Glucose, UA: NEGATIVE mg/dL
Hgb urine dipstick: NEGATIVE
Leukocytes, UA: NEGATIVE
Nitrite: NEGATIVE
Protein, ur: NEGATIVE mg/dL
Specific Gravity, Urine: 1.023 (ref 1.005–1.030)
Urobilinogen, UA: 0.2 mg/dL (ref 0.0–1.0)
pH: 6 (ref 5.0–8.0)

## 2013-11-21 LAB — CA 125: CA 125: 9.2 U/mL (ref 0.0–30.2)

## 2013-11-23 DIAGNOSIS — L570 Actinic keratosis: Secondary | ICD-10-CM | POA: Diagnosis not present

## 2013-11-23 DIAGNOSIS — L299 Pruritus, unspecified: Secondary | ICD-10-CM | POA: Diagnosis not present

## 2013-11-25 ENCOUNTER — Telehealth: Payer: Self-pay | Admitting: *Deleted

## 2013-11-25 MED ORDER — ESTRADIOL ACETATE 0.05 MG/24HR VA RING: 1.0000 | VAGINAL_RING | VAGINAL | Status: DC

## 2013-11-25 NOTE — Telephone Encounter (Signed)
Pt was seen on 11/20/13 rx for vagifem 10 mcg is too expensive and would like to have the femring Rx again. Please advise

## 2013-11-25 NOTE — Telephone Encounter (Signed)
rx sent, pt informed.  

## 2013-11-25 NOTE — Telephone Encounter (Signed)
Okay to prescribe Femring x1 year

## 2013-12-10 ENCOUNTER — Encounter: Payer: Self-pay | Admitting: Gynecology

## 2013-12-15 ENCOUNTER — Ambulatory Visit
Admission: RE | Admit: 2013-12-15 | Discharge: 2013-12-15 | Disposition: A | Discharge status: Home / Self Care | Payer: Medicare Other | Source: Ambulatory Visit

## 2013-12-15 DIAGNOSIS — Z1231 Encounter for screening mammogram for malignant neoplasm of breast: Secondary | ICD-10-CM

## 2013-12-22 ENCOUNTER — Telehealth: Payer: Self-pay | Admitting: *Deleted

## 2013-12-22 DIAGNOSIS — Z803 Family history of malignant neoplasm of breast: Secondary | ICD-10-CM

## 2013-12-22 DIAGNOSIS — Z8543 Personal history of malignant neoplasm of ovary: Secondary | ICD-10-CM

## 2013-12-22 NOTE — Telephone Encounter (Signed)
Pt has had mammogram screening now will need yearly MRI of breast, per note on 11/20/13 okay to place order.

## 2013-12-23 NOTE — Telephone Encounter (Signed)
Spoke with Judeen Hammans at [facility] and they will contact pt to schedule. I called pt to let her know that [facility] will call her to schedule. Order placed.

## 2013-12-23 NOTE — Telephone Encounter (Signed)
Appointment 12/31/13 @ 1:45pm

## 2013-12-31 ENCOUNTER — Ambulatory Visit
Admission: RE | Admit: 2013-12-31 | Discharge: 2013-12-31 | Disposition: A | Discharge status: Home / Self Care | Payer: Medicare Other | Source: Ambulatory Visit | Attending: Gynecology | Admitting: Gynecology

## 2013-12-31 DIAGNOSIS — R928 Other abnormal and inconclusive findings on diagnostic imaging of breast: Secondary | ICD-10-CM | POA: Diagnosis not present

## 2013-12-31 DIAGNOSIS — Z803 Family history of malignant neoplasm of breast: Secondary | ICD-10-CM

## 2013-12-31 DIAGNOSIS — Z8543 Personal history of malignant neoplasm of ovary: Secondary | ICD-10-CM

## 2013-12-31 MED ORDER — GADOBENATE DIMEGLUMINE 529 MG/ML IV SOLN
5.0000 mL | Freq: Once | INTRAVENOUS | Status: AC | PRN
  Administered 2013-12-31: 5 mL via INTRAVENOUS

## 2014-04-15 DIAGNOSIS — Z23 Encounter for immunization: Secondary | ICD-10-CM | POA: Diagnosis not present

## 2014-05-24 ENCOUNTER — Encounter: Payer: Self-pay | Admitting: Gynecology

## 2014-06-29 ENCOUNTER — Other Ambulatory Visit: Payer: Self-pay | Admitting: Dermatology

## 2014-06-29 DIAGNOSIS — L570 Actinic keratosis: Secondary | ICD-10-CM | POA: Diagnosis not present

## 2014-06-29 DIAGNOSIS — D0439 Carcinoma in situ of skin of other parts of face: Secondary | ICD-10-CM | POA: Diagnosis not present

## 2014-06-29 DIAGNOSIS — D0430 Carcinoma in situ of skin of unspecified part of face: Secondary | ICD-10-CM | POA: Diagnosis not present

## 2014-07-09 DIAGNOSIS — Z23 Encounter for immunization: Secondary | ICD-10-CM | POA: Diagnosis not present

## 2014-07-09 DIAGNOSIS — C569 Malignant neoplasm of unspecified ovary: Secondary | ICD-10-CM | POA: Diagnosis not present

## 2014-07-09 DIAGNOSIS — Z0000 Encounter for general adult medical examination without abnormal findings: Secondary | ICD-10-CM | POA: Diagnosis not present

## 2014-07-09 DIAGNOSIS — H9190 Unspecified hearing loss, unspecified ear: Secondary | ICD-10-CM | POA: Diagnosis not present

## 2014-07-09 DIAGNOSIS — E785 Hyperlipidemia, unspecified: Secondary | ICD-10-CM | POA: Diagnosis not present

## 2014-07-09 DIAGNOSIS — Z1389 Encounter for screening for other disorder: Secondary | ICD-10-CM | POA: Diagnosis not present

## 2014-08-19 DIAGNOSIS — D0439 Carcinoma in situ of skin of other parts of face: Secondary | ICD-10-CM | POA: Diagnosis not present

## 2014-08-19 DIAGNOSIS — C44319 Basal cell carcinoma of skin of other parts of face: Secondary | ICD-10-CM | POA: Diagnosis not present

## 2014-08-19 DIAGNOSIS — D485 Neoplasm of uncertain behavior of skin: Secondary | ICD-10-CM | POA: Diagnosis not present

## 2014-10-04 DIAGNOSIS — L905 Scar conditions and fibrosis of skin: Secondary | ICD-10-CM | POA: Diagnosis not present

## 2014-10-04 DIAGNOSIS — C4441 Basal cell carcinoma of skin of scalp and neck: Secondary | ICD-10-CM | POA: Diagnosis not present

## 2014-10-06 DIAGNOSIS — H2513 Age-related nuclear cataract, bilateral: Secondary | ICD-10-CM | POA: Diagnosis not present

## 2014-11-04 ENCOUNTER — Other Ambulatory Visit: Payer: Self-pay

## 2014-11-04 DIAGNOSIS — Z1231 Encounter for screening mammogram for malignant neoplasm of breast: Secondary | ICD-10-CM

## 2014-11-22 ENCOUNTER — Encounter: Payer: Self-pay | Admitting: Gynecology

## 2014-11-22 ENCOUNTER — Other Ambulatory Visit (HOSPITAL_COMMUNITY)
Admission: RE | Admit: 2014-11-22 | Discharge: 2014-11-22 | Disposition: A | Discharge status: Home / Self Care | Payer: Medicare Other | Source: Ambulatory Visit | Attending: Gynecology | Admitting: Gynecology

## 2014-11-22 ENCOUNTER — Ambulatory Visit (INDEPENDENT_AMBULATORY_CARE_PROVIDER_SITE_OTHER): Payer: Medicare Other | Admitting: Gynecology

## 2014-11-22 VITALS — BP 120/78 | Ht 65.0 in | Wt 118.0 lb

## 2014-11-22 DIAGNOSIS — M8580 Other specified disorders of bone density and structure, unspecified site: Secondary | ICD-10-CM | POA: Diagnosis not present

## 2014-11-22 DIAGNOSIS — Z01419 Encounter for gynecological examination (general) (routine) without abnormal findings: Secondary | ICD-10-CM

## 2014-11-22 DIAGNOSIS — Z1272 Encounter for screening for malignant neoplasm of vagina: Secondary | ICD-10-CM | POA: Diagnosis not present

## 2014-11-22 DIAGNOSIS — N952 Postmenopausal atrophic vaginitis: Secondary | ICD-10-CM | POA: Diagnosis not present

## 2014-11-22 DIAGNOSIS — C569 Malignant neoplasm of unspecified ovary: Secondary | ICD-10-CM | POA: Diagnosis not present

## 2014-11-22 DIAGNOSIS — Z124 Encounter for screening for malignant neoplasm of cervix: Secondary | ICD-10-CM | POA: Insufficient documentation

## 2014-11-22 MED ORDER — ESTRADIOL 2 MG VA RING: 2.0000 mg | VAGINAL_RING | VAGINAL | Status: DC

## 2014-11-22 NOTE — Patient Instructions (Signed)
You may obtain a copy of any labs that were done today by logging onto MyChart as outlined in the instructions provided with your AVS (after visit summary). The office will not call with normal lab results but certainly if there are any significant abnormalities then we will contact you.   Health Maintenance, Female A healthy lifestyle and preventative care can promote health and wellness.  Maintain regular health, dental, and eye exams.  Eat a healthy diet. Foods like vegetables, fruits, whole grains, low-fat dairy products, and lean protein foods contain the nutrients you need without too many calories. Decrease your intake of foods high in solid fats, added sugars, and salt. Get information about a proper diet from your caregiver, if necessary.  Regular physical exercise is one of the most important things you can do for your health. Most adults should get at least 150 minutes of moderate-intensity exercise (any activity that increases your heart rate and causes you to sweat) each week. In addition, most adults need muscle-strengthening exercises on 2 or more days a week.   Maintain a healthy weight. The body mass index (BMI) is a screening tool to identify possible weight problems. It provides an estimate of body fat based on height and weight. Your caregiver can help determine your BMI, and can help you achieve or maintain a healthy weight. For adults 20 years and older:  A BMI below 18.5 is considered underweight.  A BMI of 18.5 to 24.9 is normal.  A BMI of 25 to 29.9 is considered overweight.  A BMI of 30 and above is considered obese.  Maintain normal blood lipids and cholesterol by exercising and minimizing your intake of saturated fat. Eat a balanced diet with plenty of fruits and vegetables. Blood tests for lipids and cholesterol should begin at age 73 and be repeated every 5 years. If your lipid or cholesterol levels are high, you are over 50, or you are a high risk for heart  disease, you may need your cholesterol levels checked more frequently.Ongoing high lipid and cholesterol levels should be treated with medicines if diet and exercise are not effective.  If you smoke, find out from your caregiver how to quit. If you do not use tobacco, do not start.  Lung cancer screening is recommended for adults aged 28 80 years who are at high risk for developing lung cancer because of a history of smoking. Yearly low-dose computed tomography (CT) is recommended for people who have at least a 30-pack-year history of smoking and are a current smoker or have quit within the past 15 years. A pack year of smoking is smoking an average of 1 pack of cigarettes a day for 1 year (for example: 1 pack a day for 30 years or 2 packs a day for 15 years). Yearly screening should continue until the smoker has stopped smoking for at least 15 years. Yearly screening should also be stopped for people who develop a health problem that would prevent them from having lung cancer treatment.  If you are pregnant, do not drink alcohol. If you are breastfeeding, be very cautious about drinking alcohol. If you are not pregnant and choose to drink alcohol, do not exceed 1 drink per day. One drink is considered to be 12 ounces (355 mL) of beer, 5 ounces (148 mL) of wine, or 1.5 ounces (44 mL) of liquor.  Avoid use of street drugs. Do not share needles with anyone. Ask for help if you need support or instructions about stopping  the use of drugs.  High blood pressure causes heart disease and increases the risk of stroke. Blood pressure should be checked at least every 1 to 2 years. Ongoing high blood pressure should be treated with medicines, if weight loss and exercise are not effective.  If you are 59 to 71 years old, ask your caregiver if you should take aspirin to prevent strokes.  Diabetes screening involves taking a blood sample to check your fasting blood sugar level. This should be done once every 3  years, after age 91, if you are within normal weight and without risk factors for diabetes. Testing should be considered at a younger age or be carried out more frequently if you are overweight and have at least 1 risk factor for diabetes.  Breast cancer screening is essential preventative care for women. You should practice "breast self-awareness." This means understanding the normal appearance and feel of your breasts and may include breast self-examination. Any changes detected, no matter how small, should be reported to a caregiver. Women in their 66s and 30s should have a clinical breast exam (CBE) by a caregiver as part of a regular health exam every 1 to 3 years. After age 101, women should have a CBE every year. Starting at age 100, women should consider having a mammogram (breast X-ray) every year. Women who have a family history of breast cancer should talk to their caregiver about genetic screening. Women at a high risk of breast cancer should talk to their caregiver about having an MRI and a mammogram every year.  Breast cancer gene (BRCA)-related cancer risk assessment is recommended for women who have family members with BRCA-related cancers. BRCA-related cancers include breast, ovarian, tubal, and peritoneal cancers. Having family members with these cancers may be associated with an increased risk for harmful changes (mutations) in the breast cancer genes BRCA1 and BRCA2. Results of the assessment will determine the need for genetic counseling and BRCA1 and BRCA2 testing.  The Pap test is a screening test for cervical cancer. Women should have a Pap test starting at age 57. Between ages 25 and 35, Pap tests should be repeated every 2 years. Beginning at age 37, you should have a Pap test every 3 years as long as the past 3 Pap tests have been normal. If you had a hysterectomy for a problem that was not cancer or a condition that could lead to cancer, then you no longer need Pap tests. If you are  between ages 50 and 76, and you have had normal Pap tests going back 10 years, you no longer need Pap tests. If you have had past treatment for cervical cancer or a condition that could lead to cancer, you need Pap tests and screening for cancer for at least 20 years after your treatment. If Pap tests have been discontinued, risk factors (such as a new sexual partner) need to be reassessed to determine if screening should be resumed. Some women have medical problems that increase the chance of getting cervical cancer. In these cases, your caregiver may recommend more frequent screening and Pap tests.  The human papillomavirus (HPV) test is an additional test that may be used for cervical cancer screening. The HPV test looks for the virus that can cause the cell changes on the cervix. The cells collected during the Pap test can be tested for HPV. The HPV test could be used to screen women aged 44 years and older, and should be used in women of any age  who have unclear Pap test results. After the age of 28, women should have HPV testing at the same frequency as a Pap test.  Colorectal cancer can be detected and often prevented. Most routine colorectal cancer screening begins at the age of 57 and continues through age 30. However, your caregiver may recommend screening at an earlier age if you have risk factors for colon cancer. On a yearly basis, your caregiver may provide home test kits to check for hidden blood in the stool. Use of a small camera at the end of a tube, to directly examine the colon (sigmoidoscopy or colonoscopy), can detect the earliest forms of colorectal cancer. Talk to your caregiver about this at age 78, when routine screening begins. Direct examination of the colon should be repeated every 5 to 10 years through age 28, unless early forms of pre-cancerous polyps or small growths are found.  Hepatitis C blood testing is recommended for all people born from [birth year] through 1967 and any  individual with known risks for hepatitis C.  Practice safe sex. Use condoms and avoid high-risk sexual practices to reduce the spread of sexually transmitted infections (STIs). Sexually active women aged 14 and younger should be checked for Chlamydia, which is a common sexually transmitted infection. Older women with new or multiple partners should also be tested for Chlamydia. Testing for other STIs is recommended if you are sexually active and at increased risk.  Osteoporosis is a disease in which the bones lose minerals and strength with aging. This can result in serious bone fractures. The risk of osteoporosis can be identified using a bone density scan. Women ages 2 and over and women at risk for fractures or osteoporosis should discuss screening with their caregivers. Ask your caregiver whether you should be taking a calcium supplement or vitamin D to reduce the rate of osteoporosis.  Menopause can be associated with physical symptoms and risks. Hormone replacement therapy is available to decrease symptoms and risks. You should talk to your caregiver about whether hormone replacement therapy is right for you.  Use sunscreen. Apply sunscreen liberally and repeatedly throughout the day. You should seek shade when your shadow is shorter than you. Protect yourself by wearing long sleeves, pants, a wide-brimmed hat, and sunglasses year round, whenever you are outdoors.  Notify your caregiver of new moles or changes in moles, especially if there is a change in shape or color. Also notify your caregiver if a mole is larger than the size of a pencil eraser.  Stay current with your immunizations. Document Released: 01/22/2011 Document Revised: 11/03/2012 Document Reviewed: 01/22/2011 [facility] Patient Information 2014 [place].

## 2014-11-22 NOTE — Progress Notes (Signed)
Barbara Schwartz 1945/09/14 [id number]        68 y.o.  G2P1011 for breast and pelvic exam.  Several issues noted below.  Past medical history,surgical history, problem list, medications, allergies, family history and social history were all reviewed and documented as reviewed in the EPIC chart.  ROS:  Performed with pertinent positives and negatives included in the history, assessment and plan.   Additional significant findings :  none   Exam: Kim Counsellor Vitals:   11/22/14 1123  BP: 120/78  Height: 5\' 5"  (1.651 m)  Weight: 118 lb (53.524 kg)   General appearance:  Normal affect, orientation and appearance. Skin: Grossly normal HEENT: Without gross lesions.  No cervical or supraclavicular adenopathy. Thyroid normal.  Lungs:  Clear without wheezing, rales or rhonchi Cardiac: RR, without RMG Abdominal:  Soft, nontender, without masses, guarding, rebound, organomegaly or hernia Breasts:  Examined lying and sitting without masses, retractions, discharge or axillary adenopathy. Pelvic:  Ext/BUS/vagina with atrophic changes. Pap smear of cuff  Adnexa  Without masses or tenderness    Anus and perineum  Normal   Rectovaginal  Normal sphincter tone without palpated masses or tenderness.    Assessment/Plan:  69 y.o. G90P1011 female for breast and pelvic exam.   1. Postmenopausal/atrophic genital changes.  Patient using Femring 0.05 for atrophic vaginal changes. Also frequent urinary tract infections which seem to have resolved since using this. We talked about trying Vagifem last year but it was also very expensive and she decided to continue with the Femring. On review of her chart it appears that she had previously been on Estring and then was switched to [facility] which she called ask for a refill. I reviewed the difference between the 2 as far as absorption. She is not having issues with hot flashes or night sweats and is using a primarily for vaginal support. She does want to  continue this. The issues of absorption with risks of stroke heart attack DVT breast cancer discussed. We'll go ahead with Estring and she'll call if she has any issues with this. 2. Ovarian cancer, stage III 1990 status post TAH/BSO. Exam NED. Check CA-125 today. 3. Osteopenia.  DEXA 2015 T score -1.4. Stable from prior DEXA. Had used Boniva 2006 through 2011. They calculated a FRAX recognizing she had been on Boniva which was 14%/2.5%. Plan repeat DEXA at two-year interval. Increase calcium and vitamin D reviewed. 4. Mammography scheduled this coming month. Also does MRIs due to strong family history of premenopausal breast cancer in mother, maternal grandmother and maternal great aunt. She also has her personal history of breast cancer. She's been offered and discussed genetic testing multiple times and declines. SBE monthly reviewed. 5. Colonoscopy 2015. Repeat at their recommended interval. 6. Pap smear 2015. Pap smear done today. 7. Health maintenance. No routine blood work done as patient reports this done at her primary physician's office. Follow up 1 year, sooner as needed.     [name], 11:58 AM 11/22/2014

## 2014-11-22 NOTE — Addendum Note (Signed)
Addended by: Nelva Nay on: 11/22/2014 12:18 PM   Modules accepted: Orders

## 2014-11-23 LAB — URINALYSIS W MICROSCOPIC + REFLEX CULTURE

## 2014-11-23 LAB — CA 125: CA 125: 11 U/mL (ref <35)

## 2014-11-24 LAB — CYTOLOGY - PAP

## 2014-12-21 ENCOUNTER — Ambulatory Visit
Admission: RE | Admit: 2014-12-21 | Discharge: 2014-12-21 | Disposition: A | Discharge status: Home / Self Care | Payer: Medicare Other | Source: Ambulatory Visit

## 2014-12-21 DIAGNOSIS — Z1231 Encounter for screening mammogram for malignant neoplasm of breast: Secondary | ICD-10-CM | POA: Diagnosis not present

## 2014-12-31 ENCOUNTER — Telehealth: Payer: Self-pay | Admitting: *Deleted

## 2014-12-31 ENCOUNTER — Telehealth: Payer: Self-pay

## 2014-12-31 DIAGNOSIS — Z803 Family history of malignant neoplasm of breast: Secondary | ICD-10-CM

## 2014-12-31 NOTE — Telephone Encounter (Signed)
Patient was not home. Husband informed to let patient know that I got her call and Dr. Loetta Rough was fine with ordering. We will be back in touch with her hopefully next week regarding scheduling.

## 2014-12-31 NOTE — Telephone Encounter (Signed)
Okay to schedule MRI due to strong family history of premenopausal breast cancer

## 2014-12-31 NOTE — Telephone Encounter (Signed)
Asked if you would order breast MRI for her. She said she had her regular screening 12/21/14 and you should get report. She said the breast MRI has been a regular part of her care for the last 3 years.

## 2014-12-31 NOTE — Telephone Encounter (Signed)
-----   Message from Ramond Craver, [place] sent at 12/31/2014  9:46 AM EDT ----- Regarding: mri Per Dr. Loetta Rough  "Okay to schedule MRI due to strong family history of premenopausal breast cancer"

## 2014-12-31 NOTE — Telephone Encounter (Signed)
Order placed at [facility] will have pt call to schedule as they need to speak with her regarding this.

## 2015-01-03 DIAGNOSIS — R05 Cough: Secondary | ICD-10-CM | POA: Diagnosis not present

## 2015-01-03 NOTE — Telephone Encounter (Signed)
Appointment 01/13/15 @ 4:45pm at [facility].

## 2015-01-04 ENCOUNTER — Other Ambulatory Visit: Payer: Self-pay | Admitting: Dermatology

## 2015-01-04 DIAGNOSIS — L570 Actinic keratosis: Secondary | ICD-10-CM | POA: Diagnosis not present

## 2015-01-04 DIAGNOSIS — D0460 Carcinoma in situ of skin of unspecified upper limb, including shoulder: Secondary | ICD-10-CM | POA: Diagnosis not present

## 2015-01-04 DIAGNOSIS — D0462 Carcinoma in situ of skin of left upper limb, including shoulder: Secondary | ICD-10-CM | POA: Diagnosis not present

## 2015-01-13 ENCOUNTER — Ambulatory Visit
Admission: RE | Admit: 2015-01-13 | Discharge: 2015-01-13 | Disposition: A | Discharge status: Home / Self Care | Payer: Medicare Other | Source: Ambulatory Visit | Attending: Gynecology | Admitting: Gynecology

## 2015-01-13 DIAGNOSIS — Z803 Family history of malignant neoplasm of breast: Secondary | ICD-10-CM | POA: Diagnosis not present

## 2015-01-13 DIAGNOSIS — Z8543 Personal history of malignant neoplasm of ovary: Secondary | ICD-10-CM | POA: Diagnosis not present

## 2015-01-13 MED ORDER — GADOBENATE DIMEGLUMINE 529 MG/ML IV SOLN
11.0000 mL | Freq: Once | INTRAVENOUS | Status: AC | PRN
  Administered 2015-01-13: 11 mL via INTRAVENOUS

## 2015-01-14 ENCOUNTER — Telehealth: Payer: Self-pay | Admitting: *Deleted

## 2015-01-14 NOTE — Telephone Encounter (Signed)
[facility] called and would like you to review MRI breast and see radiologist recommendation. Please advise

## 2015-01-17 ENCOUNTER — Other Ambulatory Visit: Payer: Self-pay

## 2015-01-18 NOTE — Telephone Encounter (Signed)
I left a message on the phone yesterday for her to call me. Call her tomorrow and get me out of a room and I'll talk to her.

## 2015-01-18 NOTE — Telephone Encounter (Signed)
Dr.Fontaine I just wanted to make sure you saw the recommendation from the radiologist before I close this encounter.

## 2015-01-19 NOTE — Telephone Encounter (Signed)
I left another message for pt to call.

## 2015-01-20 ENCOUNTER — Telehealth: Payer: Self-pay | Admitting: Gynecology

## 2015-01-20 NOTE — Telephone Encounter (Signed)
Dr.Fontaine spoke with patient.

## 2015-01-20 NOTE — Telephone Encounter (Signed)
Had left messages for patient over the last several days for her to call me in reference to her MRI report. She returns my call today with her husband on speaker phone as they are out of town but received a message. I reviewed with her that the MRI is normal as far as the breasts are concerned but there is a consolidation is questionable adenopathy in the right lung for which they recommended a CT scan. They actually had contacted Dr. Felipa Eth who is their primary in reference to a chronic cough she is having and he is in the process of arranging a CT scan already. He asked them to have the MRI report sent to his office and we will go ahead and do that and that he is can follow up as far as the CT scan and any further referrals or treatment is needed.

## 2015-01-21 ENCOUNTER — Other Ambulatory Visit: Payer: Self-pay | Admitting: Internal Medicine

## 2015-01-21 DIAGNOSIS — R918 Other nonspecific abnormal finding of lung field: Secondary | ICD-10-CM

## 2015-01-25 DIAGNOSIS — IMO0002 Reserved for concepts with insufficient information to code with codable children: Secondary | ICD-10-CM | POA: Insufficient documentation

## 2015-01-25 DIAGNOSIS — R918 Other nonspecific abnormal finding of lung field: Secondary | ICD-10-CM | POA: Diagnosis not present

## 2015-01-26 ENCOUNTER — Other Ambulatory Visit: Payer: Medicare Other

## 2015-01-26 ENCOUNTER — Ambulatory Visit
Admission: RE | Admit: 2015-01-26 | Discharge: 2015-01-26 | Disposition: A | Discharge status: Home / Self Care | Payer: Medicare Other | Source: Ambulatory Visit | Attending: Internal Medicine | Admitting: Internal Medicine

## 2015-01-26 DIAGNOSIS — R918 Other nonspecific abnormal finding of lung field: Secondary | ICD-10-CM | POA: Diagnosis not present

## 2015-01-26 MED ORDER — IOPAMIDOL (ISOVUE-300) INJECTION 61%
75.0000 mL | Freq: Once | INTRAVENOUS | Status: AC | PRN
  Administered 2015-01-26: 75 mL via INTRAVENOUS

## 2015-01-27 ENCOUNTER — Other Ambulatory Visit: Payer: Medicare Other

## 2015-01-28 ENCOUNTER — Encounter: Payer: Self-pay | Admitting: Pulmonary Disease

## 2015-01-28 ENCOUNTER — Ambulatory Visit (INDEPENDENT_AMBULATORY_CARE_PROVIDER_SITE_OTHER): Payer: Medicare Other | Admitting: Pulmonary Disease

## 2015-01-28 VITALS — BP 124/66 | HR 73 | Ht 65.0 in | Wt 118.0 lb

## 2015-01-28 DIAGNOSIS — J479 Bronchiectasis, uncomplicated: Secondary | ICD-10-CM | POA: Insufficient documentation

## 2015-01-28 DIAGNOSIS — R938 Abnormal findings on diagnostic imaging of other specified body structures: Secondary | ICD-10-CM | POA: Diagnosis not present

## 2015-01-28 DIAGNOSIS — R911 Solitary pulmonary nodule: Secondary | ICD-10-CM | POA: Diagnosis not present

## 2015-01-28 DIAGNOSIS — R059 Cough, unspecified: Secondary | ICD-10-CM | POA: Insufficient documentation

## 2015-01-28 DIAGNOSIS — R9389 Abnormal findings on diagnostic imaging of other specified body structures: Secondary | ICD-10-CM

## 2015-01-28 DIAGNOSIS — R05 Cough: Secondary | ICD-10-CM

## 2015-01-28 NOTE — Addendum Note (Signed)
Addended by: Len Blalock on: 01/28/2015 10:22 AM   Modules accepted: Orders

## 2015-01-28 NOTE — Progress Notes (Signed)
Subjective:    Patient ID: Barbara Schwartz, female    DOB: 1946-04-19, 69 y.o.   MRN: [id number]  HPI Chief Complaint  Patient presents with  . Advice Only    Referred by Dr. Delfina Redwood for chronic lung infection.  pt c/o fatigue, cough X1 month.     Barbara Schwartz is really frustrated form a chronic cough.  The cough stared around [place] day weekend.  It is assocated with a tickle in her throat.  Dry cough.  She had some headache and thought that it was allergy related.  She took Flonase and Claritin which didn't help.  She was seen by Dr. Delfina Redwood and was prescribed a cough syrup.  She left town around this time and came back with worsening symptoms.  By this point she had fatigue, cough.   Dyspnea is worseing, fatigue iws worsening.  No fever or chills at this point. However early on in the illness she had night sweats which were drenching. Since the early onset of the symptoms she says her cough has changed somewhat and is now more of a dry hacking cough. The cough is definitely worse with talking. Initially the cough was worse on lying flat but that is not the case anymore. She is now able to rest at night without cough. She denies heartburn, change in the character of the cough around meals. She does not have sinus symptoms. She smoked as a teenager for about 3 years. She has lost 4 pounds in the last year which she attributes to increased exercise. She is an avid exerciser and participates in tennis, spin class, and regular exercise. For the last several weeks she has been unable to participate in this activity due to worsening fatigue and shortness of breath.  Past Medical History  Diagnosis Date  . Osteopenia 2015    T score -1. 4 FRAX 14%/2.5% noting prior Boniva use 2006 through 2011  . Ovarian cancer 1990    Stage 3  . Squamous cell skin cancer      Family History  Problem Relation Age of Onset  . Breast cancer Mother     38's  . Ovarian cancer Mother   . Heart disease Father   .  Breast cancer Maternal Grandmother     Age 55's     History   Social History  . Marital Status: Married    Spouse Name: N/A  . Number of Children: N/A  . Years of Education: N/A   Occupational History  . Not on file.   Social History Main Topics  . Smoking status: Former Smoker -- 0.20 packs/day for 8 years    Types: Cigarettes    Quit date: 07/24/1967  . Smokeless tobacco: Never Used  . Alcohol Use: 1.8 oz/week    3 Standard drinks or equivalent per week  . Drug Use: No  . Sexual Activity: Yes    Birth Control/ Protection: Surgical     Comment: 1st intercourse 69 yo-Fewer than 5 partners   Other Topics Concern  . Not on file   Social History Narrative     No Known Allergies   Outpatient Prescriptions Prior to Visit  Medication Sig Dispense Refill  . aspirin 81 MG tablet Take 81 mg by mouth daily.    . Calcium Carbonate-Vitamin D (CALCIUM + D PO) Take by mouth.    . estradiol (ESTRING) 2 MG vaginal ring Place 2 mg vaginally every 3 (three) months. follow package directions 1 each 4  .  Estradiol Acetate (FEMRING) 0.05 MG/24HR RING Place 1 each vaginally every 3 (three) months. (Patient not taking: Reported on 01/28/2015) 0.9 each 4   No facility-administered medications prior to visit.       Review of Systems  Constitutional: Positive for fatigue. Negative for fever and unexpected weight change.  HENT: Negative for congestion, dental problem, ear pain, nosebleeds, postnasal drip, rhinorrhea, sinus pressure, sneezing, sore throat and trouble swallowing.   Eyes: Negative for redness and itching.  Respiratory: Positive for cough. Negative for chest tightness, shortness of breath and wheezing.   Cardiovascular: Negative for palpitations and leg swelling.  Gastrointestinal: Negative for nausea and vomiting.  Genitourinary: Negative for dysuria.  Musculoskeletal: Negative for joint swelling.  Skin: Negative for rash.  Neurological: Negative for headaches.    Hematological: Does not bruise/bleed easily.  Psychiatric/Behavioral: Negative for dysphoric mood. The patient is not nervous/anxious.        Objective:   Physical Exam Filed Vitals:   01/28/15 0930  BP: 124/66  Pulse: 73  Height: 5\' 5"  (1.651 m)  Weight: 118 lb (53.524 kg)  SpO2: 98%   RA  Gen: well appearing, no acute distress HENT: NCAT, OP clear, neck supple without masses Eyes: PERRL, EOMi Lymph: no cervical lymphadenopathy PULM: CTA B CV: RRR, no mgr, no JVD GI: BS+, soft, nontender, no hsm Derm: no rash or skin breakdown MSK: normal bulk and tone Neuro: A&Ox4, CN II-XII intact, strength 5/5 in all 4 extremities Psyche: normal mood and affect  CT images from this week personally reviewed, see below.       Assessment & Plan:  Abnormal CT scan, chest I have personally reviewed the images from the CT scan of her chest from this week which showed patchy consolidation and bilateral distribution in the bases of her lungs. Considering the fact that she is a nonsmoker, and that these symptoms came on abruptly several weeks ago and were associated with night sweats I feel strongly that this is likely an infectious or inflammatory process. The most likely scenario would be that she now has community-acquired pneumonia though an atypical infection is also possible. There is a remote possibility that this could be malignancy and so we need to follow this closely. If she does not begin to have resolution of symptoms and chest CT abnormalities then she will need to have a biopsy.  Plan: Take moxifloxacin for 10 days as prescribed by primary care physician Repeat chest x-ray in 2 weeks and follow-up with [place] in clinic, if the chest x-ray is worse than we need to make arrangements for a biopsy sooner rather than later If the chest x-ray in 2 weeks is unchanged in her symptoms have improved then we will have her get a CT chest 6 weeks from now If she is still having symptoms 2 weeks  from now I would like for her to have a prednisone taper for 2 weeks Follow-up in 6 weeks with a repeat CT chest  Cough Her cough is undoubtedly related to the process in her lungs as I have described above. However, she also has laryngeal irritation contributing to the severity of the cough.  Plan: Voice rest was encouraged Use the Tussionex prescribed by primary care physician    Current outpatient prescriptions:  .  aspirin 81 MG tablet, Take 81 mg by mouth daily., Disp: , Rfl:  .  Calcium Carbonate-Vitamin D (CALCIUM + D PO), Take by mouth., Disp: , Rfl:  .  estradiol (ESTRING) 2 MG vaginal  ring, Place 2 mg vaginally every 3 (three) months. follow package directions, Disp: 1 each, Rfl: 4

## 2015-01-28 NOTE — Assessment & Plan Note (Signed)
I have personally reviewed the images from the CT scan of her chest from this week which showed patchy consolidation and bilateral distribution in the bases of her lungs. Considering the fact that she is a nonsmoker, and that these symptoms came on abruptly several weeks ago and were associated with night sweats I feel strongly that this is likely an infectious or inflammatory process. The most likely scenario would be that she now has community-acquired pneumonia though an atypical infection is also possible. There is a remote possibility that this could be malignancy and so we need to follow this closely. If she does not begin to have resolution of symptoms and chest CT abnormalities then she will need to have a biopsy.  Plan: Take moxifloxacin for 10 days as prescribed by primary care physician Repeat chest x-ray in 2 weeks and follow-up with [place] in clinic, if the chest x-ray is worse than we need to make arrangements for a biopsy sooner rather than later If the chest x-ray in 2 weeks is unchanged in her symptoms have improved then we will have her get a CT chest 6 weeks from now If she is still having symptoms 2 weeks from now I would like for her to have a prednisone taper for 2 weeks Follow-up in 6 weeks with a repeat CT chest

## 2015-01-28 NOTE — Assessment & Plan Note (Signed)
Her cough is undoubtedly related to the process in her lungs as I have described above. However, she also has laryngeal irritation contributing to the severity of the cough.  Plan: Voice rest was encouraged Use the Tussionex prescribed by primary care physician

## 2015-01-28 NOTE — Patient Instructions (Signed)
Take the Tussionex prescribed by her primary care physician to help suppress the cough Take the antibiotics prescribed by her primary care physician  We will see you back in 2 weeks with a chest x-ray  We will schedule a repeat CT chest for 6 weeks from now and see you at that point  You need to try to suppress your cough to allow your larynx (voice box) to heal.  For three days don't talk, laugh, sing, or clear your throat. Do everything you can to suppress the cough during this time. Use hard candies (sugarless Jolly Ranchers) or non-mint or non-menthol containing cough drops during this time to soothe your throat.  Use a cough suppressant (Delsym or what I have prescribed you) around the clock during this time.  After three days, gradually increase the use of your voice and back off on the cough suppressants.

## 2015-02-03 ENCOUNTER — Other Ambulatory Visit (INDEPENDENT_AMBULATORY_CARE_PROVIDER_SITE_OTHER): Payer: Medicare Other

## 2015-02-03 ENCOUNTER — Telehealth: Payer: Self-pay | Admitting: Pulmonary Disease

## 2015-02-03 DIAGNOSIS — R05 Cough: Secondary | ICD-10-CM

## 2015-02-03 DIAGNOSIS — R059 Cough, unspecified: Secondary | ICD-10-CM

## 2015-02-03 LAB — CBC WITH DIFFERENTIAL/PLATELET
Basophils Absolute: 0 10*3/uL (ref 0.0–0.1)
Basophils Relative: 0.4 % (ref 0.0–3.0)
Eosinophils Absolute: 0.2 10*3/uL (ref 0.0–0.7)
Eosinophils Relative: 1.9 % (ref 0.0–5.0)
HCT: 32.7 % — ABNORMAL LOW (ref 36.0–46.0)
Hemoglobin: 10.7 g/dL — ABNORMAL LOW (ref 12.0–15.0)
Lymphocytes Relative: 19.8 % (ref 12.0–46.0)
Lymphs Abs: 1.8 10*3/uL (ref 0.7–4.0)
MCHC: 32.7 g/dL (ref 30.0–36.0)
MCV: 82.3 fl (ref 78.0–100.0)
Monocytes Absolute: 1 10*3/uL (ref 0.1–1.0)
Monocytes Relative: 10.6 % (ref 3.0–12.0)
Neutro Abs: 6.1 10*3/uL (ref 1.4–7.7)
Neutrophils Relative %: 67.3 % (ref 43.0–77.0)
Platelets: 375 10*3/uL (ref 150.0–400.0)
RBC: 3.97 Mil/uL (ref 3.87–5.11)
RDW: 14 % (ref 11.5–15.5)
WBC: 9.1 10*3/uL (ref 4.0–10.5)

## 2015-02-03 MED ORDER — MOXIFLOXACIN HCL 400 MG PO TABS: 400.0000 mg | ORAL_TABLET | Freq: Every day | ORAL | Status: DC

## 2015-02-03 NOTE — Telephone Encounter (Signed)
Spoke with spouse and is aware of results. Nothing further needed

## 2015-02-03 NOTE — Telephone Encounter (Signed)
Spoke with pt's husband, Dr. Lennie Muckle. States that pt is not any better since seeing BQ. Her PCP started her on an antibiotic and she is almost done with it. Still reports dry coughing and wheezing. Denies SOB, chest tightness or fever. Feels in his medical opinion she needs a CBC done.  VS - please advise in BQ's absence. Thanks.

## 2015-02-03 NOTE — Telephone Encounter (Signed)
CBC    Component Value Date/Time   WBC 9.1 02/03/2015 1535   RBC 3.97 02/03/2015 1535   HGB 10.7* 02/03/2015 1535   HCT 32.7* 02/03/2015 1535   PLT 375.0 02/03/2015 1535   MCV 82.3 02/03/2015 1535   MCHC 32.7 02/03/2015 1535   RDW 14.0 02/03/2015 1535   LYMPHSABS 1.8 02/03/2015 1535   MONOABS 1.0 02/03/2015 1535   EOSABS 0.2 02/03/2015 1535   BASOSABS 0.0 02/03/2015 1535    Please inform her that her CBC shows mild anemia >> hemoglobin 10.7.  White cell count and differential are normal.

## 2015-02-03 NOTE — Telephone Encounter (Signed)
Okay to set up for CBC with differential.  Can extend her moxifloxacin prescription by 5 more days.  Send script for moxifloxacin 400 mg, one pill daily, dispense 5 pills with no refills.

## 2015-02-03 NOTE — Telephone Encounter (Signed)
Called spoke with spouse. Aware of recs. rx sent in and CBC ordered. Nothing further needed

## 2015-02-11 ENCOUNTER — Ambulatory Visit (INDEPENDENT_AMBULATORY_CARE_PROVIDER_SITE_OTHER)
Admission: RE | Admit: 2015-02-11 | Discharge: 2015-02-11 | Disposition: A | Discharge status: Home / Self Care | Payer: Medicare Other | Source: Ambulatory Visit | Attending: Pulmonary Disease | Admitting: Pulmonary Disease

## 2015-02-11 ENCOUNTER — Telehealth: Payer: Self-pay | Admitting: Pulmonary Disease

## 2015-02-11 DIAGNOSIS — R911 Solitary pulmonary nodule: Secondary | ICD-10-CM | POA: Diagnosis not present

## 2015-02-11 DIAGNOSIS — R05 Cough: Secondary | ICD-10-CM | POA: Diagnosis not present

## 2015-02-11 NOTE — Telephone Encounter (Signed)
Please let her know that it was not worse than the image seen on her last CT chest. In fact I thought it looked a little better.  I would like to stick with our original plan of having a repeat CT chest when she comes to see me next.

## 2015-02-11 NOTE — Telephone Encounter (Signed)
Per Dr. Lake Bells, patient does not need more antibiotics, he wants her to see TP next week to determine if she needs anymore antibiotics.  Patient scheduled to see TP on Monday morning. Nothing further needed.

## 2015-02-11 NOTE — Telephone Encounter (Signed)
Patient wants to know if she needs to continue the antibiotic.  She is completely out of her antibiotics and will need refill.  BQ - please advise.

## 2015-02-11 NOTE — Telephone Encounter (Signed)
Pt had CXR done 02/11/15 and is requesting results. Please advise Dr. Lake Bells thanks

## 2015-02-14 ENCOUNTER — Encounter: Payer: Self-pay | Admitting: Adult Health

## 2015-02-14 ENCOUNTER — Ambulatory Visit (INDEPENDENT_AMBULATORY_CARE_PROVIDER_SITE_OTHER): Payer: Medicare Other | Admitting: Adult Health

## 2015-02-14 VITALS — BP 120/70 | HR 52 | Temp 97.5°F | Ht 65.0 in | Wt 120.0 lb

## 2015-02-14 DIAGNOSIS — R938 Abnormal findings on diagnostic imaging of other specified body structures: Secondary | ICD-10-CM

## 2015-02-14 DIAGNOSIS — T50905A Adverse effect of unspecified drugs, medicaments and biological substances, initial encounter: Secondary | ICD-10-CM | POA: Insufficient documentation

## 2015-02-14 DIAGNOSIS — R05 Cough: Secondary | ICD-10-CM

## 2015-02-14 DIAGNOSIS — R059 Cough, unspecified: Secondary | ICD-10-CM

## 2015-02-14 DIAGNOSIS — T887XXA Unspecified adverse effect of drug or medicament, initial encounter: Secondary | ICD-10-CM

## 2015-02-14 DIAGNOSIS — R9389 Abnormal findings on diagnostic imaging of other specified body structures: Secondary | ICD-10-CM

## 2015-02-14 MED ORDER — HYDROCODONE-HOMATROPINE 5-1.5 MG/5ML PO SYRP: 5.0000 mL | ORAL_SOLUTION | Freq: Every evening | ORAL | Status: DC | PRN

## 2015-02-14 NOTE — Progress Notes (Signed)
   Subjective:    Patient ID: Barbara Schwartz, female    DOB: Mar 06, 1946, 69 y.o.   MRN: [id number]  HPI 69 yo female with chronic cough since 11/2014 and lung nodule on CT   02/14/2015 Follow up : Cough and Abnormal CT  Pt returns for 2 weeks follow up .  Seen last office for pulmonary consult for new onset cough that began  After Memorial day this year. Has not been able to stop coughing . Has associated sob as well.  CT chest 01/26/15 showed nodular areas in peripheral lingula, RLL and RML consolidation.  She was started 15 days of Avelox 7/8 which she has finished .  Follow up CXR on 7/22 did hot show any significant change with bilateral pulmonary infiltrates .  She denies discolored mucus, fever, n/v/d, over reflux or drainage.  Used delsym some initially without much help.  Since last office visit she has seen slight improvement in cough but still coughs throughout day , worse with talking . She coughed throughout exam .  Gets winded easily. Is very active with biking , exercise, hiking but since May not able to do activity due to dyspnea.  Is making her self do cycle and hot yoga. Coughs during episodes and gets dizzy.  Good appetite, no weight loss.  Has a planned biking  trip to [place] on 03/08/15 .    Review of Systems Constitutional:   No  weight loss, night sweats,  Fevers, chills, fatigue, or  lassitude.  HEENT:   No headaches,  Difficulty swallowing,  Tooth/dental problems, or  Sore throat,                No sneezing, itching, ear ache, nasal congestion, post nasal drip,   CV:  No chest pain,  Orthopnea, PND, swelling in lower extremities, anasarca, dizziness, palpitations, syncope.   GI  No heartburn, indigestion, abdominal pain, nausea, vomiting, diarrhea, change in bowel habits, loss of appetite, bloody stools.   Resp:   No chest wall deformity  Skin: no rash or lesions.  GU: no dysuria, change in color of urine, no urgency or frequency.  No flank pain, no hematuria    MS:  No joint pain or swelling.  No decreased range of motion.  No back pain.  Psych:  No change in mood or affect. No depression or anxiety.  No memory loss.         Objective:   Physical Exam GEN: A/Ox3; pleasant , NAD, thin elderly female   HEENT:  [place]/AT,  EACs-clear, TMs-wnl, NOSE-clear, THROAT-clear, no lesions, no postnasal drip or exudate noted.   NECK:  Supple w/ fair ROM; no JVD; normal carotid impulses w/o bruits; no thyromegaly or nodules palpated; no lymphadenopathy.  RESP  Coarse BS, w/o, wheezes/ rales/ or rhonchi.Dry cough during exam  GI:   Soft & nt; nml bowel sounds; no organomegaly or masses detected.  Musco: Warm bil, no deformities or joint swelling noted.   Neuro: alert, no focal deficits noted.    Skin: Warm, no lesions or rashes         Assessment & Plan:

## 2015-02-14 NOTE — Progress Notes (Signed)
Case discussed with Tammy Parrett.  Plan bronch tomorrow

## 2015-02-14 NOTE — Patient Instructions (Addendum)
Delsym 2 tsp Twice daily  For cough  Hydromet 1/2 tsp At bedtime  As needed  Severe cough ,  May make you sleepy   Zyrtec 10mg  At bedtime  For drainage  prilosec 20mg  daily before meal for reflux cough  We are setting you up for a Bronchoscopy in am with .Dr. Lake Bells . Activity as tolerated.  Please contact office for sooner follow up if symptoms do not improve or worsen or seek emergency care

## 2015-02-14 NOTE — Assessment & Plan Note (Signed)
Nodularity on CT chest with associated cough  No change with 15 days of Avelox  Case discussed with Dr. Lake Bells and pt will have FOB scheduled for tomorrow.  Pt and husband aware.

## 2015-02-14 NOTE — Assessment & Plan Note (Signed)
Pt w/ adverse reaction to Xopenex with tremulousness, weakness and hyperventilation  HR reg ~74 with return to 64 after rest . Lungs clear with improved cough . No wheezing  No stridor or oral swelling. No rash.  She improved with rest in office and supportive care  Advised to follow up tomorrow as planned  Please contact office for sooner follow up if symptoms do not improve or worsen or seek emergency care

## 2015-02-14 NOTE — Assessment & Plan Note (Addendum)
Chronic cough ? Etiology with underlying nodularity on CT chest  ? Triggers of drainage /reflux -will try preventive regimen  Pt was given xopenex neb to see if would help ?RAD component.  Unfortunately pt cough improved but she became very nervous, anxious, tremulous and tearful.  O2 sats were 100% on RA, O2 2 L placed for comofr , HR regular at 75.  Supportive care . Husband at bedside.  Dr. Halford Chessman  With exam . Dr. Lake Bells aware .  She was lied down on exam table , supportive care provided  Improved over time with resolution of symptoms.  HR rechecked at 64.  Pt was taken home by husband

## 2015-02-15 ENCOUNTER — Telehealth: Payer: Self-pay | Admitting: Pulmonary Disease

## 2015-02-15 ENCOUNTER — Inpatient Hospital Stay (HOSPITAL_COMMUNITY)
Admission: RE | Admit: 2015-02-15 | Discharge: 2015-02-15 | Disposition: A | Discharge status: Home / Self Care | Payer: Medicare Other | Source: Ambulatory Visit

## 2015-02-15 ENCOUNTER — Ambulatory Visit (HOSPITAL_COMMUNITY)
Admission: RE | Admit: 2015-02-15 | Discharge: 2015-02-15 | Disposition: A | Discharge status: Home / Self Care | Payer: Medicare Other | Source: Ambulatory Visit | Attending: Pulmonary Disease | Admitting: Pulmonary Disease

## 2015-02-15 ENCOUNTER — Encounter (HOSPITAL_COMMUNITY)
Admission: RE | Disposition: A | Discharge status: Home / Self Care | Payer: Self-pay | Source: Ambulatory Visit | Attending: Pulmonary Disease

## 2015-02-15 DIAGNOSIS — Z85828 Personal history of other malignant neoplasm of skin: Secondary | ICD-10-CM | POA: Insufficient documentation

## 2015-02-15 DIAGNOSIS — R9389 Abnormal findings on diagnostic imaging of other specified body structures: Secondary | ICD-10-CM

## 2015-02-15 DIAGNOSIS — T17890A Other foreign object in other parts of respiratory tract causing asphyxiation, initial encounter: Secondary | ICD-10-CM | POA: Diagnosis not present

## 2015-02-15 DIAGNOSIS — J189 Pneumonia, unspecified organism: Secondary | ICD-10-CM | POA: Diagnosis not present

## 2015-02-15 DIAGNOSIS — Z8543 Personal history of malignant neoplasm of ovary: Secondary | ICD-10-CM | POA: Diagnosis not present

## 2015-02-15 DIAGNOSIS — R05 Cough: Secondary | ICD-10-CM | POA: Diagnosis not present

## 2015-02-15 DIAGNOSIS — Z87891 Personal history of nicotine dependence: Secondary | ICD-10-CM | POA: Diagnosis not present

## 2015-02-15 DIAGNOSIS — R938 Abnormal findings on diagnostic imaging of other specified body structures: Secondary | ICD-10-CM | POA: Diagnosis not present

## 2015-02-15 DIAGNOSIS — R059 Cough, unspecified: Secondary | ICD-10-CM

## 2015-02-15 HISTORY — PX: VIDEO BRONCHOSCOPY: SHX5072

## 2015-02-15 LAB — BODY FLUID CELL COUNT WITH DIFFERENTIAL
Eos, Fluid: 1 %
Lymphs, Fluid: 19 %
Monocyte-Macrophage-Serous Fluid: 28 % — ABNORMAL LOW (ref 50–90)
Neutrophil Count, Fluid: 52 % — ABNORMAL HIGH (ref 0–25)
Total Nucleated Cell Count, Fluid: 105 cu mm (ref 0–1000)

## 2015-02-15 SURGERY — BRONCHOSCOPY, WITH FLUOROSCOPY
Anesthesia: Moderate Sedation | Laterality: Bilateral

## 2015-02-15 MED ORDER — PHENYLEPHRINE HCL 0.25 % NA SOLN
NASAL | Status: DC | PRN
  Administered 2015-02-15: 2 via NASAL

## 2015-02-15 MED ORDER — LIDOCAINE HCL 2 % EX GEL
CUTANEOUS | Status: DC | PRN
  Administered 2015-02-15: 1

## 2015-02-15 MED ORDER — MIDAZOLAM HCL 10 MG/2ML IJ SOLN
INTRAMUSCULAR | Status: DC | PRN
  Administered 2015-02-15: 2 mg via INTRAVENOUS
  Administered 2015-02-15: 1 mg via INTRAVENOUS

## 2015-02-15 MED ORDER — FENTANYL CITRATE (PF) 100 MCG/2ML IJ SOLN
INTRAMUSCULAR | Status: AC
  Filled 2015-02-15: qty 4

## 2015-02-15 MED ORDER — LIDOCAINE HCL (PF) 1 % IJ SOLN
INTRAMUSCULAR | Status: DC | PRN
  Administered 2015-02-15: 6 mL

## 2015-02-15 MED ORDER — FENTANYL CITRATE (PF) 100 MCG/2ML IJ SOLN
INTRAMUSCULAR | Status: DC | PRN
  Administered 2015-02-15: 25 ug via INTRAVENOUS
  Administered 2015-02-15: 50 ug via INTRAVENOUS

## 2015-02-15 MED ORDER — SODIUM CHLORIDE 0.9 % IV SOLN
INTRAVENOUS | Status: DC
  Administered 2015-02-15: 10:00:00 via INTRAVENOUS

## 2015-02-15 MED ORDER — MIDAZOLAM HCL 5 MG/ML IJ SOLN
INTRAMUSCULAR | Status: AC
  Filled 2015-02-15: qty 2

## 2015-02-15 MED ORDER — LIDOCAINE HCL 2 % EX GEL: 1.0000 "application " | Freq: Once | CUTANEOUS | Status: DC

## 2015-02-15 MED ORDER — PHENYLEPHRINE HCL 0.25 % NA SOLN: 1.0000 | Freq: Four times a day (QID) | NASAL | Status: DC | PRN

## 2015-02-15 MED ORDER — BUTAMBEN-TETRACAINE-BENZOCAINE 2-2-14 % EX AERO: 1.0000 | INHALATION_SPRAY | Freq: Once | CUTANEOUS | Status: DC

## 2015-02-15 NOTE — Discharge Instructions (Signed)
Flexible Bronchoscopy, Care After These instructions give you information on caring for yourself after your procedure. Your doctor may also give you more specific instructions. Call your doctor if you have any problems or questions after your procedure. HOME CARE  Do not eat or drink anything for 2 hours after your procedure. If you try to eat or drink before the medicine wears off, food or drink could go into your lungs. You could also burn yourself.  After 2 hours have passed and when you can cough and gag normally, you may eat soft food and drink liquids slowly.  The day after the test, you may eat your normal diet.  You may do your normal activities.  Keep all doctor visits. GET HELP RIGHT AWAY IF:  You get more and more short of breath.  You get light-headed.  You feel like you are going to pass out (faint).  You have chest pain.  You have new problems that worry you.  You cough up more than a little blood.  You cough up more blood than before. MAKE SURE YOU:  Understand these instructions.  Will watch your condition.  Will get help right away if you are not doing well or get worse. Document Released: 05/06/2009 Document Revised: 07/14/2013 Document Reviewed: 03/13/2013 [facility] Patient Information 2015 [place], [place]. This information is not intended to replace advice given to you by your health care provider. Make sure you discuss any questions you have with your health care provider. Flexible Bronchoscopy, Care After These instructions give you information on caring for yourself after your procedure. Your doctor may also give you more specific instructions. Call your doctor if you have any problems or questions after your procedure. HOME CARE  Do not eat or drink anything for 2 hours after your procedure. If you try to eat or drink before the medicine wears off, food or drink could go into your lungs. You could also burn yourself.  After 2 hours have passed and  when you can cough and gag normally, you may eat soft food and drink liquids slowly.  The day after the test, you may eat your normal diet.  You may do your normal activities.  Keep all doctor visits. GET HELP RIGHT AWAY IF:  You get more and more short of breath.  You get light-headed.  You feel like you are going to pass out (faint).  You have chest pain.  You have new problems that worry you.  You cough up more than a little blood.  You cough up more blood than before. MAKE SURE YOU:  Understand these instructions.  Continue taking the cough syrup prescribed in clinic yesterday  Will watch your condition.  Will get help right away if you are not doing well or get worse. Document Released: 05/06/2009 Document Revised: 07/14/2013 Document Reviewed: 03/13/2013 [facility] Patient Information 2015 [place], [place]. This information is not intended to replace advice given to you by your health care provider. Make sure you discuss any questions you have with your health care provider.  Do not eat or drink anything until after 2:00 pm  On 02/15/2015

## 2015-02-15 NOTE — Progress Notes (Signed)
Video Bronchoscopy performed  Intervention bronchial washing  Patient tolerated well.  Will continue to monitor.

## 2015-02-15 NOTE — Op Note (Signed)
PCCM Bronchoscopy Procedure Note  The patient was informed of the risks (including but not limited to bleeding, infection, respiratory failure, lung injury, tooth/oral injury) and benefits of the procedure and gave consent, see chart.  Indication: Cough, abnormal CXR  Post procedure diagnosis: mild mucus plugging segmental airways lingula, RML, RLL  Location:  Bronchoscopy Suite  Condition pre procedure: Stable  Medications for procedure: Versed 3mg  IV, Fentanyl 10mcg IV  Procedure description: The bronchoscope was introduced through the mouth and passed to the bilateral lungs to the level of the subsegmental bronchi throughout the tracheobronchial tree.  Airway exam revealed normal appearing and functioning larynx. The trachea was normal and the carina was sharp.  The airways of the bilateral tracheobronchial tree were normal in appearance and free of inflammation or airway lesion.  However there was thick grey mucus in the right middle lobe, right lower lobe and the lingula which was partially occluding segmental airways.    Procedures performed:  1) BAL right middle lobe  Specimens sent:  BAL> cytology, cell count, bacterial culture, fungal culture, AFB culture  Condition post procedure: Stable  EBL: none  Complications: None  Roselie Awkward, MD [place] PCCM Pager: [phone] Cell: [phone] After 3pm or if no response, call [phone]

## 2015-02-15 NOTE — Telephone Encounter (Signed)
lmtcb x1 

## 2015-02-15 NOTE — Progress Notes (Signed)
Critical value of bacteria present in BAL received from lab.  Dr. Lake Bells notified.

## 2015-02-15 NOTE — H&P (Signed)
  [facility] PCCM  HPI: Ms. Behan has been suffering with cough and an abnormal CXR for the last several weeks despite antibiotics and cough suppressive therapy.  She has persistent infiltrates on her CXR.  Past Medical History  Diagnosis Date  . Osteopenia 2015    T score -1. 4 FRAX 14%/2.5% noting prior Boniva use 2006 through 2011  . Ovarian cancer 1990    Stage 3  . Squamous cell skin cancer      Family History  Problem Relation Age of Onset  . Breast cancer Mother     50's  . Ovarian cancer Mother   . Heart disease Father   . Breast cancer Maternal Grandmother     Age 14's     History   Social History  . Marital Status: Married    Spouse Name: N/A  . Number of Children: N/A  . Years of Education: N/A   Occupational History  . Not on file.   Social History Main Topics  . Smoking status: Former Smoker -- 0.20 packs/day for 8 years    Types: Cigarettes    Quit date: 07/24/1967  . Smokeless tobacco: Never Used  . Alcohol Use: 1.8 oz/week    3 Standard drinks or equivalent per week  . Drug Use: No  . Sexual Activity: Yes    Birth Control/ Protection: Surgical     Comment: 1st intercourse 69 yo-Fewer than 5 partners   Other Topics Concern  . Not on file   Social History Narrative     Allergies  Allergen Reactions  . Albuterol Palpitations  . Xopenex [Levalbuterol Hcl] Palpitations    Pt with extreme nervousness , shakes and weakness after breathing treatment.      @encmedstart @  Filed Vitals:   02/15/15 1030 02/15/15 1035 02/15/15 1040 02/15/15 1045  BP: 146/56 150/79 136/55 140/57  Pulse: 52 53 51 46  Temp:      Resp: 17 16 13 22   Height:      Weight:      SpO2: 98% 99% 99% 100%   Gen: well appearing HENT: OP clear, TM's clear, neck supple PULM: CTA B, normal percussion CV: RRR, no mgr, trace edema GI: BS+, soft, nontender Derm: no cyanosis or rash Psyche: normal mood and affect   CBC    Component Value Date/Time   WBC 9.1 02/03/2015 1535    RBC 3.97 02/03/2015 1535   HGB 10.7* 02/03/2015 1535   HCT 32.7* 02/03/2015 1535   PLT 375.0 02/03/2015 1535   MCV 82.3 02/03/2015 1535   MCHC 32.7 02/03/2015 1535   RDW 14.0 02/03/2015 1535   LYMPHSABS 1.8 02/03/2015 1535   MONOABS 1.0 02/03/2015 1535   EOSABS 0.2 02/03/2015 1535   BASOSABS 0.0 02/03/2015 1535      CT images reviwed > RML and LLL opacification, scattered nodule  Impression/Plan; Persistent infiltrates on CT chest, not clear if this is atypical infection, organizing pneumonia, or less likely a malignancy. Bronchoscopy today, will take transbronchial biopsies if cough controlled  Roselie Awkward, MD [place] PCCM Pager: [phone] Cell: [phone] After 3pm or if no response, call [phone]

## 2015-02-16 ENCOUNTER — Encounter (HOSPITAL_COMMUNITY): Payer: Self-pay | Admitting: Pulmonary Disease

## 2015-02-16 NOTE — Telephone Encounter (Signed)
Will not call pt back per the pt's husband's request.

## 2015-02-18 ENCOUNTER — Telehealth: Payer: Self-pay

## 2015-02-18 LAB — CULTURE, RESPIRATORY W GRAM STAIN: Special Requests: NORMAL

## 2015-02-18 NOTE — Telephone Encounter (Signed)
-----   Message from Juanito Doom, MD sent at 02/17/2015  5:59 PM EDT ----- A, Please let her know that there were no cancer cells on her bronchoscopy but we will await the results of the culture. Thanks B

## 2015-02-18 NOTE — Telephone Encounter (Signed)
Notes Recorded by Juanito Doom, MD on 02/11/2015 at 5:05 PM A, Please let her know that her CXR was not any worse which was good. We will stick with her plan to get another CT chest around the time of the next visit. Thanks B -------------------  lmtcb X1 to make pt aware of cxr and bronch results.

## 2015-02-21 ENCOUNTER — Ambulatory Visit: Payer: Medicare Other | Admitting: Internal Medicine

## 2015-02-21 NOTE — Telephone Encounter (Signed)
Called and spoke to pt's husband, Dr. Lennie Muckle. Informed him of the results and recs per BQ. Dr. Lennie Muckle had concerns of the amount of radiation exposure the pt has had recently. Dr. Lennie Muckle is requesting BQ's opinion on the amount of exposure.

## 2015-02-21 NOTE — Telephone Encounter (Signed)
pts husband Dr. Lennie Muckle returned call [phone]

## 2015-02-22 NOTE — Telephone Encounter (Signed)
Called and spoke to pt's husband. Informed him of the recs per BQ. Pt's husband stated they are going outside of the country on 8.15.16 and is requesting an appt. Appt made with BQ on 8.10.16. Pt's husband verbalized understanding and denied any further questions or concerns at this time.

## 2015-02-22 NOTE — Telephone Encounter (Signed)
OK, we can hold off on the CT until I see and examine her next

## 2015-02-28 ENCOUNTER — Ambulatory Visit (INDEPENDENT_AMBULATORY_CARE_PROVIDER_SITE_OTHER): Payer: Medicare Other | Admitting: Internal Medicine

## 2015-02-28 VITALS — BP 138/69 | HR 50 | Temp 97.5°F | Ht 65.0 in | Wt 114.5 lb

## 2015-02-28 DIAGNOSIS — R05 Cough: Secondary | ICD-10-CM | POA: Diagnosis not present

## 2015-02-28 DIAGNOSIS — R059 Cough, unspecified: Secondary | ICD-10-CM

## 2015-02-28 NOTE — Progress Notes (Signed)
[facility] for Infectious Disease  Reason for Consult: Chronic cough Referring Physician: Dr. Maudry Mayhew  Patient Active Problem List   Diagnosis Date Noted  . Cough 01/28/2015    Priority: High  . Abnormal CT scan, chest 01/28/2015    Priority: High  . Adverse effects of medication 02/14/2015  . Ovarian cancer   . Osteopenia     Patient's Medications  New Prescriptions   No medications on file  Previous Medications   ASPIRIN 81 MG TABLET    Take 81 mg by mouth daily.   CALCIUM CARBONATE-VITAMIN D (CALCIUM + D PO)    Take by mouth.  Modified Medications   No medications on file  Discontinued Medications   ESTRADIOL (ESTRING) 2 MG VAGINAL RING    Place 2 mg vaginally every 3 (three) months. follow package directions   HYDROCODONE-HOMATROPINE (HYDROMET) 5-1.5 MG/5ML SYRUP    Take 5 mLs by mouth at bedtime as needed for cough.    Recommendations: 1. Continue observation off of antibiotics 2. Cough suppression 3. Activity as tolerated 4. Await final results from bronchoscopy cultures 5. She will call me upon her return from [place] in about one month   Assessment: I do not have any way of knowing for certain what has caused her illness but I suspect that she had some form of pneumonia beginning in late May and now has a slowly improving persistent cough. Much of her illness sounds like acute pertussis with slow recovery. However, I would not be terribly surprised if she grew nontuberculous mycobacteria from her bronchoscopy specimen. At this point, since she is improving, I would simply continue to try to suppress her cough and wait and see if she has a complete recovery while waiting on final culture results. She is in agreement with that plan. She and her husband plan to travel to [place] next week. I plan to do some walking and cycling. She states that she is looking forward to this and feels that she can simply adjust her activity level to how she is feeling.    HPI: Barbara Schwartz is a 69 y.o. female who is referred to me for chronic, persistent cough. She has a remote history of ovarian cancer that was cured over 20 years ago. She quit smoking cigarettes in 1969. In general she has been extremely healthy and very active. She and her husband hike and cycle on a regular basis. In late May over [place] Day weekend she had the sudden onset of a tickle in the back of her throat that quickly became a severe, hacking dry cough. Her cough was never productive. She did not feel like she was having fever and had no chills. However, she did have drenching night sweats for the first 7-10 days of her illness. She was short of breath with activity and could not do her usual cycling and yoga for most of June. Wispy bilateral infiltrates were seen on chest x-ray and a follow-up chest CT scan. She was given an empiric course of moxifloxacin on 01/28/2015. She did not note any improvement while on antibiotics and her chest x-ray did not improve. She underwent bronchoscopy on 02/15/2015. Dr. Roselie Awkward noted "thick gray mucus" in the right middle lobe, right lower lobe and lingula. Cytology did not reveal any malignant cells. Routine Gram, AFB and fungal stains were negative. Routine culture grew only normal flora. AFB and fungal cultures are negative to date.   She has  tried a variety of treatments to help suppress her persistent cough including hydrocodone, Delsym, Prilosec and Zyrtec. The hydrocodone made her excessively sleepy and did not seem to help very much so she stopped taking it. She has not noted any significant relief with the other treatments. Both she and her husband have noted that she has improved slightly over the past month. She still has dry cough but it is a little better. She is still short of breath with vigorous exertion but has been able to return to spin classes and yoga although she does not push herself as much as she normally would. She has had some  diminished appetite and has lost about 4-5 pounds during this illness.  She had her husband have traveled extensively. She is not aware of any exposure to anybody with tuberculosis. She used to work as a Training and development officer at [employer] and always had negative TB skin tests. She does recall being around a young girl with a severe respiratory illness about one week before onset of her illness in May.  Review of Systems: Constitutional: positive for anorexia, fatigue, night sweats and weight loss, negative for chills and fevers Eyes: negative Ears, nose, mouth, throat, and face: negative Respiratory: positive for cough, dyspnea on exertion and pleurisy/chest pain, negative for sputum and wheezing Cardiovascular: negative Gastrointestinal: negative Genitourinary:negative    Past Medical History  Diagnosis Date  . Osteopenia 2015    T score -1. 4 FRAX 14%/2.5% noting prior Boniva use 2006 through 2011  . Ovarian cancer 1990    Stage 3  . Squamous cell skin cancer     History  Substance Use Topics  . Smoking status: Former Smoker -- 0.20 packs/day for 8 years    Types: Cigarettes    Quit date: 07/24/1967  . Smokeless tobacco: Never Used  . Alcohol Use: 1.8 oz/week    3 Standard drinks or equivalent per week    Family History  Problem Relation Age of Onset  . Breast cancer Mother     72's  . Ovarian cancer Mother   . Heart disease Father   . Breast cancer Maternal Grandmother     Age 85's   Allergies  Allergen Reactions  . Albuterol Palpitations  . Xopenex [Levalbuterol Hcl] Palpitations    Pt with extreme nervousness , shakes and weakness after breathing treatment.     OBJECTIVE: Filed Vitals:   02/28/15 1116  BP: 138/69  Pulse: 50  Temp: 97.5 F (36.4 C)  TempSrc: Oral  Height: 5\' 5"  (1.651 m)  Weight: 114 lb 8 oz (51.937 kg)   Body mass index is 19.05 kg/(m^2).   General: She is in no distress other than having frequent coughing spells during the  exam Skin: No rash Lymph nodes: No palpable adenopathy Eyes: Normal external exam Oral: No oropharyngeal lesions Lungs: Clear Cor: Regular S1 and S2 with no murmurs Abdomen: Soft and nontender with no palpable masses Extremities: Normal  Microbiology: No results found for this or any previous visit (from the past 240 hour(s)).  Michel Bickers, MD [facility] [phone] pager   [phone] cell 02/28/2015, 12:20 PM

## 2015-03-02 ENCOUNTER — Ambulatory Visit (INDEPENDENT_AMBULATORY_CARE_PROVIDER_SITE_OTHER): Payer: Medicare Other | Admitting: Pulmonary Disease

## 2015-03-02 ENCOUNTER — Encounter: Payer: Self-pay | Admitting: Pulmonary Disease

## 2015-03-02 VITALS — BP 114/62 | HR 50 | Ht 65.0 in | Wt 116.0 lb

## 2015-03-02 DIAGNOSIS — R05 Cough: Secondary | ICD-10-CM | POA: Diagnosis not present

## 2015-03-02 DIAGNOSIS — R938 Abnormal findings on diagnostic imaging of other specified body structures: Secondary | ICD-10-CM | POA: Diagnosis not present

## 2015-03-02 DIAGNOSIS — R9389 Abnormal findings on diagnostic imaging of other specified body structures: Secondary | ICD-10-CM

## 2015-03-02 DIAGNOSIS — R059 Cough, unspecified: Secondary | ICD-10-CM

## 2015-03-02 MED ORDER — TRAMADOL HCL 50 MG PO TABS: 50.0000 mg | ORAL_TABLET | Freq: Four times a day (QID) | ORAL | Status: DC | PRN

## 2015-03-02 NOTE — Progress Notes (Signed)
Subjective:    Patient ID: Barbara Schwartz, female    DOB: Mar 06, 1946, 69 y.o.   MRN: [id number]   Synopsis: This is a lady who smoked very briefly in college and has a distant history of ovarian cancer who came to our clinic in 2016 for evaluation of cough with an abnormal chest x-ray. The chest CT demonstrated consolidative findings in a patchy distribution primarily in the right middle lobe as well as some in the lingula. A bronchoscopy was performed in July 2016 which was unremarkable.  HPI Chief Complaint  Patient presents with  . Follow-up    post bronch.  pt having no complaints at this time.     Barbara Schwartz feels like she is doing well. She has a raspy throat and a dry cough worse with talking. She has completed all the medications we gave her the last time. She has no longer taken the hycodan.   No weight loss, no fever, no chills, appetite.   Still has some dyspnea, some fatigue.  Has started spin class back, not tennis yet.  Doing hot yoga.   Past Medical History  Diagnosis Date  . Osteopenia 2015    T score -1. 4 FRAX 14%/2.5% noting prior Boniva use 2006 through 2011  . Ovarian cancer 1990    Stage 3  . Squamous cell skin cancer       Review of Systems     Objective:   Physical Exam Filed Vitals:   03/02/15 1049  BP: 114/62  Pulse: 50  Height: 5\' 5"  (1.651 m)  Weight: 116 lb (52.617 kg)  SpO2: 100%   RA  Gen: well appearing HENT: OP clear, TM's clear, neck supple PULM: CTA B, normal percussion CV: RRR, no mgr, trace edema GI: BS+, soft, nontender Derm: no cyanosis or rash Psyche: normal mood and affect        Assessment & Plan:  Cough In general she is feeling much better. I believe that this started with pneumonia syndrome and it is finally starting to get better. At this point she still has a lingering cough which is due to laryngeal irritation. She has taken and acid therapy and that did not make a difference. She does not have sinus  symptoms.  Plan: Tramadol as needed for cough Voice rest to allow inflamed larynx to heal   Abnormal CT scan, chest She had pneumonia as seen on her CT chest. A bronchoscopy this far has been unrevealing. The most likely scenario here is that this was a bacterial pneumonia which was recognized somewhat late. Because she has been slowly feeling better with less dyspnea and slowly increasing energy and improving cough I believe that this is most likely bacterial pneumonia. However, should she have recurrence of symptoms and that would make [place] consider some of the more rare causes of her current pneumonia including organizing pneumonia. I do not think she has had an atypical infection but we will wait on the cultures from the bronchoscopy.  Plan: Plan on repeat CT chest in late October, though I have advised her to let me know should she have worsening dyspnea, mucus production, fevers or chest pain. If she has any other symptoms we will see her back sooner.     Current outpatient prescriptions:  .  aspirin 81 MG tablet, Take 81 mg by mouth daily., Disp: , Rfl:  .  Calcium Carbonate-Vitamin D (CALCIUM + D PO), Take by mouth., Disp: , Rfl:  .  estradiol (ESTRING) 2  MG vaginal ring, Place 2 mg vaginally every 3 (three) months. follow package directions, Disp: , Rfl:  .  traMADol (ULTRAM) 50 MG tablet, Take 1 tablet (50 mg total) by mouth every 6 (six) hours as needed (cough)., Disp: 60 tablet, Rfl: 1

## 2015-03-02 NOTE — Assessment & Plan Note (Signed)
She had pneumonia as seen on her CT chest. A bronchoscopy this far has been unrevealing. The most likely scenario here is that this was a bacterial pneumonia which was recognized somewhat late. Because she has been slowly feeling better with less dyspnea and slowly increasing energy and improving cough I believe that this is most likely bacterial pneumonia. However, should she have recurrence of symptoms and that would make [place] consider some of the more rare causes of her current pneumonia including organizing pneumonia. I do not think she has had an atypical infection but we will wait on the cultures from the bronchoscopy.  Plan: Plan on repeat CT chest in late October, though I have advised her to let me know should she have worsening dyspnea, mucus production, fevers or chest pain. If she has any other symptoms we will see her back sooner.

## 2015-03-02 NOTE — Patient Instructions (Signed)
Take the tramadol as needed for cough  You need to try to suppress your cough to allow your larynx (voice box) to heal.  For three days don't talk, laugh, sing, or clear your throat. Do everything you can to suppress the cough during this time. Use hard candies (sugarless Jolly Ranchers) or non-mint or non-menthol containing cough drops during this time to soothe your throat.  Use a cough suppressant (Delsym or what I have prescribed you) around the clock during this time.  After three days, gradually increase the use of your voice and back off on the cough suppressants.  We will schedule a CT scan for October and see you after that

## 2015-03-02 NOTE — Assessment & Plan Note (Signed)
In general she is feeling much better. I believe that this started with pneumonia syndrome and it is finally starting to get better. At this point she still has a lingering cough which is due to laryngeal irritation. She has taken and acid therapy and that did not make a difference. She does not have sinus symptoms.  Plan: Tramadol as needed for cough Voice rest to allow inflamed larynx to heal

## 2015-03-03 ENCOUNTER — Inpatient Hospital Stay: Admission: RE | Admit: 2015-03-03 | Payer: Medicare Other | Source: Ambulatory Visit

## 2015-03-14 LAB — FUNGUS CULTURE W SMEAR
Fungal Smear: NONE SEEN
Special Requests: NORMAL

## 2015-03-29 ENCOUNTER — Ambulatory Visit: Payer: Medicare Other | Admitting: Internal Medicine

## 2015-03-30 LAB — AFB CULTURE WITH SMEAR (NOT AT ARMC)
Acid Fast Smear: NONE SEEN
Special Requests: NORMAL

## 2015-04-16 DIAGNOSIS — Z23 Encounter for immunization: Secondary | ICD-10-CM | POA: Diagnosis not present

## 2015-04-26 ENCOUNTER — Inpatient Hospital Stay: Admission: RE | Admit: 2015-04-26 | Payer: Medicare Other | Source: Ambulatory Visit

## 2015-04-27 ENCOUNTER — Ambulatory Visit (INDEPENDENT_AMBULATORY_CARE_PROVIDER_SITE_OTHER)
Admission: RE | Admit: 2015-04-27 | Discharge: 2015-04-27 | Disposition: A | Discharge status: Home / Self Care | Payer: Medicare Other | Source: Ambulatory Visit | Attending: Pulmonary Disease | Admitting: Pulmonary Disease

## 2015-04-27 DIAGNOSIS — R911 Solitary pulmonary nodule: Secondary | ICD-10-CM

## 2015-04-27 DIAGNOSIS — R0602 Shortness of breath: Secondary | ICD-10-CM | POA: Diagnosis not present

## 2015-04-27 DIAGNOSIS — R9389 Abnormal findings on diagnostic imaging of other specified body structures: Secondary | ICD-10-CM

## 2015-05-02 ENCOUNTER — Ambulatory Visit (INDEPENDENT_AMBULATORY_CARE_PROVIDER_SITE_OTHER): Payer: Medicare Other | Admitting: Pulmonary Disease

## 2015-05-02 ENCOUNTER — Encounter: Payer: Self-pay | Admitting: Pulmonary Disease

## 2015-05-02 VITALS — BP 110/62 | HR 54 | Ht 65.0 in | Wt 114.0 lb

## 2015-05-02 DIAGNOSIS — J479 Bronchiectasis, uncomplicated: Secondary | ICD-10-CM | POA: Diagnosis not present

## 2015-05-02 DIAGNOSIS — R911 Solitary pulmonary nodule: Secondary | ICD-10-CM

## 2015-05-02 NOTE — Patient Instructions (Signed)
If you get a cold with a respiratory infection (mucus production) been you should be treated with antibiotics, I recommend a Z-Pak Feel free to call us for this prescription if necessary We will arrange a CT scan of your chest one year from now and see you after that

## 2015-05-02 NOTE — Progress Notes (Signed)
   Subjective:    Patient ID: Barbara Schwartz, female    DOB: 01/06/1946, 69 y.o.   MRN: [id number]   Synopsis: This is a lady who smoked very briefly in college and has a distant history of ovarian cancer who came to our clinic in 2016 for evaluation of cough with an abnormal chest x-ray. The chest CT demonstrated consolidative findings in a patchy distribution primarily in the right middle lobe as well as some in the lingula. A bronchoscopy was performed in July 2016 which was unremarkable.  HPI Chief Complaint  Patient presents with  . Follow-up    review CT chest.  pt has some concerns regarding CT.     She has been doing well. She has a very mild cough which is much better than before. She does not produce mucus. She still has some shortness of breath which is a bit frustrating for her but in general she is better than when I saw her earlier in the year. Her husband notes that her weight has been up and down and she has lost about 4 pounds in the last month. She also still has some hoarseness.  Past Medical History  Diagnosis Date  . Osteopenia 2015    T score -1. 4 FRAX 14%/2.5% noting prior Boniva use 2006 through 2011  . Ovarian cancer ([place]) 1990    Stage 3  . Squamous cell skin cancer       Review of Systems     Objective:   Physical Exam Filed Vitals:   05/02/15 1335  BP: 110/62  Pulse: 54  Height: 5\' 5"  (1.651 m)  Weight: 114 lb (51.71 kg)  SpO2: 100%   RA  Gen: well appearing HENT: OP clear, TM's clear, neck supple PULM: CTA B, normal percussion CV: RRR, no mgr, trace edema GI: BS+, soft, nontender Derm: no cyanosis or rash Psyche: normal mood and affect        Assessment & Plan:  Bronchiectasis without acute exacerbation ([place]) I have personally reviewed the images from her CT chest from last week. Fortunately the pneumonia seen earlier in the year has cleared up but now there is fairly clear-cut but mild bronchiectasis in the lingula and the right  middle lobe. I explained to her that the significance of this is not clear and it is likely related to a prior respiratory infection. The findings are mild at best. Moving forward, this just means that should she have a respiratory infection with mucus production she should be treated with antibiotics sooner rather than later. I do not feel that she needs further workup at this time.  Fortunately, all cultures from her bronchoscopy earlier this year were negative.  Plan: Check with primary care physician to ensure all immunizations are up-to-date Return to clinic in 1 year     Current outpatient prescriptions:  .  aspirin 81 MG tablet, Take 81 mg by mouth daily., Disp: , Rfl:  .  Calcium Carbonate-Vitamin D (CALCIUM + D PO), Take by mouth., Disp: , Rfl:  .  estradiol (ESTRING) 2 MG vaginal ring, Place 2 mg vaginally every 3 (three) months. follow package directions, Disp: , Rfl:

## 2015-05-02 NOTE — Assessment & Plan Note (Signed)
I have personally reviewed the images from her CT chest from last week. Fortunately the pneumonia seen earlier in the year has cleared up but now there is fairly clear-cut but mild bronchiectasis in the lingula and the right middle lobe. I explained to her that the significance of this is not clear and it is likely related to a prior respiratory infection. The findings are mild at best. Moving forward, this just means that should she have a respiratory infection with mucus production she should be treated with antibiotics sooner rather than later. I do not feel that she needs further workup at this time.  Fortunately, all cultures from her bronchoscopy earlier this year were negative.  Plan: Check with primary care physician to ensure all immunizations are up-to-date Return to clinic in 1 year

## 2015-05-26 ENCOUNTER — Telehealth: Payer: Self-pay | Admitting: Pulmonary Disease

## 2015-05-26 NOTE — Telephone Encounter (Signed)
Spoke w/ spouse. He reports they are taking a trip to [place] ([place] and just needs form signed by BQ approving for her to go. He is dropping this off this afternoon. Will will await form

## 2015-05-27 NOTE — Telephone Encounter (Signed)
I have no forms at this time for this patient.  Thanks!

## 2015-05-27 NOTE — Telephone Encounter (Signed)
Dr. Richarda Osmond stated that he brought the forms in yesterday and gave them to the front desk at 4:30pm.   Spoke with Vallarie Mare and she said that he gave her a self addressed stamped envelope to mail the form back to him, she said that Dr. Lake Bells signed the paper, she made a copy for the chart to be scanned and the original was mailed to patient in [place] provided.   Dr. Lennie Muckle stated that he wanted to pick up the form today.   I went to the mailroom and the envelope was still there, so I took it from the [facility] and put in folder for Dr. Lennie Muckle to pick up today. Dr. Lennie Muckle notified that form has been left at front desk for him to pick up.   He was very grateful.  Nothing further needed. Closing encounter

## 2015-05-27 NOTE — Telephone Encounter (Signed)
Did not see form in BQ look at. Barbara Schwartz did you receive this yesterday?

## 2015-06-18 DIAGNOSIS — S20219A Contusion of unspecified front wall of thorax, initial encounter: Secondary | ICD-10-CM | POA: Diagnosis not present

## 2015-06-18 DIAGNOSIS — W1830XA Fall on same level, unspecified, initial encounter: Secondary | ICD-10-CM | POA: Diagnosis not present

## 2015-08-03 DIAGNOSIS — C569 Malignant neoplasm of unspecified ovary: Secondary | ICD-10-CM | POA: Diagnosis not present

## 2015-08-03 DIAGNOSIS — E785 Hyperlipidemia, unspecified: Secondary | ICD-10-CM | POA: Diagnosis not present

## 2015-08-03 DIAGNOSIS — Z1389 Encounter for screening for other disorder: Secondary | ICD-10-CM | POA: Diagnosis not present

## 2015-08-03 DIAGNOSIS — M859 Disorder of bone density and structure, unspecified: Secondary | ICD-10-CM | POA: Diagnosis not present

## 2015-08-03 DIAGNOSIS — J479 Bronchiectasis, uncomplicated: Secondary | ICD-10-CM | POA: Diagnosis not present

## 2015-08-03 DIAGNOSIS — Z0000 Encounter for general adult medical examination without abnormal findings: Secondary | ICD-10-CM | POA: Diagnosis not present

## 2015-08-04 DIAGNOSIS — Z0000 Encounter for general adult medical examination without abnormal findings: Secondary | ICD-10-CM | POA: Diagnosis not present

## 2015-08-04 DIAGNOSIS — R6889 Other general symptoms and signs: Secondary | ICD-10-CM | POA: Diagnosis not present

## 2015-08-04 DIAGNOSIS — E785 Hyperlipidemia, unspecified: Secondary | ICD-10-CM | POA: Diagnosis not present

## 2015-08-26 ENCOUNTER — Other Ambulatory Visit: Payer: Self-pay | Admitting: Dermatology

## 2015-08-26 DIAGNOSIS — D040 Carcinoma in situ of skin of lip: Secondary | ICD-10-CM

## 2015-08-26 DIAGNOSIS — D0472 Carcinoma in situ of skin of left lower limb, including hip: Secondary | ICD-10-CM | POA: Diagnosis not present

## 2015-08-26 HISTORY — DX: Carcinoma in situ of skin of lip: D04.0

## 2015-11-01 DIAGNOSIS — H2513 Age-related nuclear cataract, bilateral: Secondary | ICD-10-CM | POA: Diagnosis not present

## 2015-11-03 ENCOUNTER — Other Ambulatory Visit: Payer: Self-pay | Admitting: Dermatology

## 2015-11-03 DIAGNOSIS — D0472 Carcinoma in situ of skin of left lower limb, including hip: Secondary | ICD-10-CM | POA: Diagnosis not present

## 2015-11-03 DIAGNOSIS — L570 Actinic keratosis: Secondary | ICD-10-CM | POA: Diagnosis not present

## 2015-11-03 DIAGNOSIS — D485 Neoplasm of uncertain behavior of skin: Secondary | ICD-10-CM | POA: Diagnosis not present

## 2015-11-14 ENCOUNTER — Other Ambulatory Visit: Payer: Self-pay

## 2015-11-14 DIAGNOSIS — Z1231 Encounter for screening mammogram for malignant neoplasm of breast: Secondary | ICD-10-CM

## 2015-11-24 DIAGNOSIS — L570 Actinic keratosis: Secondary | ICD-10-CM | POA: Diagnosis not present

## 2015-11-24 DIAGNOSIS — L089 Local infection of the skin and subcutaneous tissue, unspecified: Secondary | ICD-10-CM | POA: Diagnosis not present

## 2015-11-29 ENCOUNTER — Ambulatory Visit (INDEPENDENT_AMBULATORY_CARE_PROVIDER_SITE_OTHER): Payer: Medicare Other | Admitting: Gynecology

## 2015-11-29 ENCOUNTER — Encounter: Payer: Self-pay | Admitting: Gynecology

## 2015-11-29 VITALS — BP 118/74 | Ht 64.5 in | Wt 115.0 lb

## 2015-11-29 DIAGNOSIS — N952 Postmenopausal atrophic vaginitis: Secondary | ICD-10-CM

## 2015-11-29 DIAGNOSIS — Z01419 Encounter for gynecological examination (general) (routine) without abnormal findings: Secondary | ICD-10-CM | POA: Diagnosis not present

## 2015-11-29 DIAGNOSIS — M8580 Other specified disorders of bone density and structure, unspecified site: Secondary | ICD-10-CM

## 2015-11-29 DIAGNOSIS — C569 Malignant neoplasm of unspecified ovary: Secondary | ICD-10-CM | POA: Diagnosis not present

## 2015-11-29 DIAGNOSIS — Z1272 Encounter for screening for malignant neoplasm of vagina: Secondary | ICD-10-CM

## 2015-11-29 MED ORDER — ESTRADIOL 2 MG VA RING: 2.0000 mg | VAGINAL_RING | VAGINAL | Status: DC

## 2015-11-29 NOTE — Patient Instructions (Signed)

## 2015-11-29 NOTE — Progress Notes (Signed)
    Barbara Schwartz 1946/01/19 [id number]        69 y.o.  G2P1011  for breast and pelvic exam. Several issues noted below.  Past medical history,surgical history, problem list, medications, allergies, family history and social history were all reviewed and documented as reviewed in the EPIC chart.  ROS:  Performed with pertinent positives and negatives included in the history, assessment and plan.   Additional significant findings :  none   Exam: Caryn Bee assistant Filed Vitals:   11/29/15 1359  BP: 118/74  Height: 5' 4.5" (1.638 m)  Weight: 115 lb (52.164 kg)   General appearance:  Normal affect, orientation and appearance. Skin: Grossly normal HEENT: Without gross lesions.  No cervical or supraclavicular adenopathy. Thyroid normal.  Lungs:  Clear without wheezing, rales or rhonchi Cardiac: RR, without RMG Abdominal:  Soft, nontender, without masses, guarding, rebound, organomegaly or hernia Breasts:  Examined lying and sitting without masses, retractions, discharge or axillary adenopathy. Pelvic:  Ext/BUS/vagina with atrophic changes  Adnexa without masses or tenderness    Anus and perineum normal   Rectovaginal normal sphincter tone without palpated masses or tenderness.    Assessment/Plan:  70 y.o. G22P1011 female for breast and pelvic exam.   1. History of stage III ovarian cancer 1990 status post TAH/BSO. Exam an NED. Check CA-125 today. Follow up in one year for reexamination. 2. Postmenopausal/atrophic genital changes. Patient on Estring 2 mg doing well once to continue. Refill 1 year provided. 3. Osteopenia. DEXA 2015 T score -1.4 stable from prior DEXA. History of Boniva 2006 through 2011. Discussed repeating now or waiting another year and patient prefers just to wait. Increased calcium vitamin D reviewed. 4. Mammography scheduled next week. Also does annual MRI due to history of premenopausal breast cancer in mother, maternal grandmother and maternal great aunt.  She also has a personal history of ovarian cancer. Has always declined genetic testing which has been offered on multiple occasions.  Of note review of her encounter last year it said personal history of breast cancer but that was error and meant to be ovarian cancer. 5. Colonoscopy 2015. Repeat at their recommended interval. 6. Pap smear 2015. Pap smear done today. 7. Health maintenance. No routine lab work done as patient does this at her primary physician's office. Follow up 1 year, sooner as needed.   [name], 2:35 PM 11/29/2015

## 2015-11-29 NOTE — Addendum Note (Signed)
Addended by: Nelva Nay on: 11/29/2015 02:47 PM   Modules accepted: Orders

## 2015-11-30 DIAGNOSIS — H903 Sensorineural hearing loss, bilateral: Secondary | ICD-10-CM | POA: Diagnosis not present

## 2015-11-30 LAB — CA 125: CA 125: 11 U/mL (ref <35)

## 2015-12-01 LAB — PAP IG W/ RFLX HPV ASCU

## 2015-12-20 DIAGNOSIS — Z029 Encounter for administrative examinations, unspecified: Secondary | ICD-10-CM | POA: Diagnosis not present

## 2015-12-23 ENCOUNTER — Ambulatory Visit
Admission: RE | Admit: 2015-12-23 | Discharge: 2015-12-23 | Disposition: A | Discharge status: Home / Self Care | Payer: Medicare Other | Source: Ambulatory Visit

## 2015-12-23 DIAGNOSIS — Z1231 Encounter for screening mammogram for malignant neoplasm of breast: Secondary | ICD-10-CM

## 2016-01-09 ENCOUNTER — Telehealth: Payer: Self-pay | Admitting: *Deleted

## 2016-01-09 DIAGNOSIS — Z8543 Personal history of malignant neoplasm of ovary: Secondary | ICD-10-CM

## 2016-01-09 DIAGNOSIS — Z803 Family history of malignant neoplasm of breast: Secondary | ICD-10-CM

## 2016-01-09 NOTE — Telephone Encounter (Signed)
Pt called to request order for annual MRI of breast, per note on 11/29/15 "Also does annual MRI due to history of premenopausal breast cancer in mother, maternal grandmother and maternal great aunt. She also has a personal history of ovarian cancer" per this note, order placed. I will have pt call [facility] to schedule as they prefer to speak with patient to get history.   I gave pt the # to call, will wait to see when pt is schedule to document scheduled MRI.

## 2016-01-13 ENCOUNTER — Ambulatory Visit
Admission: RE | Admit: 2016-01-13 | Discharge: 2016-01-13 | Disposition: A | Discharge status: Home / Self Care | Payer: Medicare Other | Source: Ambulatory Visit | Attending: Gynecology | Admitting: Gynecology

## 2016-01-13 DIAGNOSIS — Z803 Family history of malignant neoplasm of breast: Secondary | ICD-10-CM

## 2016-01-13 DIAGNOSIS — Z8543 Personal history of malignant neoplasm of ovary: Secondary | ICD-10-CM

## 2016-01-13 MED ORDER — GADOBENATE DIMEGLUMINE 529 MG/ML IV SOLN
10.0000 mL | Freq: Once | INTRAVENOUS | Status: AC | PRN
  Administered 2016-01-13: 10 mL via INTRAVENOUS

## 2016-01-13 NOTE — Telephone Encounter (Signed)
Appointment on 01/13/16 @ 3:55pm at [facility].

## 2016-03-09 ENCOUNTER — Telehealth: Payer: Self-pay | Admitting: Pulmonary Disease

## 2016-03-09 DIAGNOSIS — R911 Solitary pulmonary nodule: Secondary | ICD-10-CM

## 2016-03-09 NOTE — Telephone Encounter (Signed)
lmtcb X1 for Barbara Schwartz at [facility]

## 2016-03-12 NOTE — Telephone Encounter (Signed)
Attempted to call [facility]. No answer, no option to leave a message. Will try back.

## 2016-03-12 NOTE — Telephone Encounter (Signed)
Per 05/02/15 OV:    Return in about 1 year (around 05/01/2016).  If you get a cold with a respiratory infection (mucus production) been you should be treated with antibiotics, I recommend a Z-Pak Feel free to call us for this prescription if necessary We will arrange a CT scan of your chest one year from now and see you after that  --  Spoke with Lattie Haw from [facility]. They are needing an updated order for pt CT to be done. I have placed this order. Nothing further needed

## 2016-03-13 ENCOUNTER — Inpatient Hospital Stay: Admission: RE | Admit: 2016-03-13 | Payer: Medicare Other | Source: Ambulatory Visit

## 2016-04-10 ENCOUNTER — Ambulatory Visit (INDEPENDENT_AMBULATORY_CARE_PROVIDER_SITE_OTHER)
Admission: RE | Admit: 2016-04-10 | Discharge: 2016-04-10 | Disposition: A | Discharge status: Home / Self Care | Payer: Medicare Other | Source: Ambulatory Visit | Attending: Pulmonary Disease | Admitting: Pulmonary Disease

## 2016-04-10 DIAGNOSIS — R911 Solitary pulmonary nodule: Secondary | ICD-10-CM

## 2016-04-17 ENCOUNTER — Telehealth: Payer: Self-pay | Admitting: Pulmonary Disease

## 2016-04-17 NOTE — Progress Notes (Signed)
LMTCB

## 2016-04-17 NOTE — Telephone Encounter (Signed)
Juanito Doom, MD  Len Blalock, CMA        A,  Please let her know this was OK, no further imaging needed.  Thanks  B    Patient aware of results. Nothing further needed.

## 2016-05-01 ENCOUNTER — Encounter: Payer: Self-pay | Admitting: Pulmonary Disease

## 2016-05-01 ENCOUNTER — Ambulatory Visit (INDEPENDENT_AMBULATORY_CARE_PROVIDER_SITE_OTHER): Payer: Medicare Other | Admitting: Pulmonary Disease

## 2016-05-01 ENCOUNTER — Other Ambulatory Visit (INDEPENDENT_AMBULATORY_CARE_PROVIDER_SITE_OTHER): Payer: Medicare Other

## 2016-05-01 VITALS — BP 124/66 | HR 51 | Ht 65.0 in | Wt 113.2 lb

## 2016-05-01 DIAGNOSIS — R911 Solitary pulmonary nodule: Secondary | ICD-10-CM

## 2016-05-01 DIAGNOSIS — R079 Chest pain, unspecified: Secondary | ICD-10-CM

## 2016-05-01 DIAGNOSIS — R0600 Dyspnea, unspecified: Secondary | ICD-10-CM | POA: Diagnosis not present

## 2016-05-01 DIAGNOSIS — Z23 Encounter for immunization: Secondary | ICD-10-CM | POA: Diagnosis not present

## 2016-05-01 LAB — CBC WITH DIFFERENTIAL/PLATELET
Basophils Absolute: 0 10*3/uL (ref 0.0–0.1)
Basophils Relative: 0.3 % (ref 0.0–3.0)
Eosinophils Absolute: 0.1 10*3/uL (ref 0.0–0.7)
Eosinophils Relative: 0.7 % (ref 0.0–5.0)
HCT: 36 % (ref 36.0–46.0)
Hemoglobin: 11.9 g/dL — ABNORMAL LOW (ref 12.0–15.0)
Lymphocytes Relative: 25.4 % (ref 12.0–46.0)
Lymphs Abs: 2.1 10*3/uL (ref 0.7–4.0)
MCHC: 33 g/dL (ref 30.0–36.0)
MCV: 83.2 fl (ref 78.0–100.0)
Monocytes Absolute: 0.7 10*3/uL (ref 0.1–1.0)
Monocytes Relative: 8.5 % (ref 3.0–12.0)
Neutro Abs: 5.4 10*3/uL (ref 1.4–7.7)
Neutrophils Relative %: 65.1 % (ref 43.0–77.0)
Platelets: 227 10*3/uL (ref 150.0–400.0)
RBC: 4.32 Mil/uL (ref 3.87–5.11)
RDW: 14 % (ref 11.5–15.5)
WBC: 8.3 10*3/uL (ref 4.0–10.5)

## 2016-05-01 NOTE — Assessment & Plan Note (Signed)
She has minimal disease seen on her CT scan which is unlikely to be in excellent condition for her dyspnea but does put her at increased risk for respiratory infections.  Plan: Hand hygiene encouraged Flu shot

## 2016-05-01 NOTE — Assessment & Plan Note (Signed)
Stable on imaging, no further imaging needed.

## 2016-05-01 NOTE — Progress Notes (Signed)
Subjective:    Patient ID: Barbara Schwartz, female    DOB: 12/10/1945, 70 y.o.   MRN: [id number]   Synopsis: This is a lady who smoked very briefly in college and has a distant history of ovarian cancer who came to our clinic in 2016 for evaluation of cough with an abnormal chest x-ray. The chest CT demonstrated consolidative findings in a patchy distribution primarily in the right middle lobe as well as some in the lingula. A bronchoscopy was performed in July 2016 which was unremarkable.  HPI Chief Complaint  Patient presents with  . Follow-up    review CT chest.  Pt c/o worsening SOB, some chest tightness with exertion.     Arla has been experiencing more shortness of breath in the last year.  Sometimes it occurs when she is active.  She will feel tightness in her chest then some dyspnea after climbing stairs, playing tennis.  Her numbers are down in terms of distance on the bike with exertion.  She will sometimes feel a pain in her chest when exerting herself which she describes as a tightness.  Her father had cardiac disease with heart failure.  The pain doesn't radiate.   Past Medical History:  Diagnosis Date  . Osteopenia 2015   T score -1. 4 FRAX 14%/2.5% noting prior Boniva use 2006 through 2011  . Ovarian cancer ([place]) 1990   Stage 3  . Squamous cell skin cancer       Review of Systems  Constitutional: Positive for fatigue. Negative for chills and fever.  HENT: Negative for postnasal drip, rhinorrhea and sinus pressure.   Respiratory: Positive for shortness of breath. Negative for cough and wheezing.   Cardiovascular: Positive for chest pain. Negative for palpitations and leg swelling.       Objective:   Physical Exam Vitals:   05/01/16 1328  BP: 124/66  Pulse: (!) 51  SpO2: 98%  Weight: 113 lb 3.2 oz (51.3 kg)  Height: 5\' 5"  (1.651 m)   RA  Gen: well appearing HENT: OP clear, TM's clear, neck supple PULM: CTA B, normal percussion CV: RRR, no mgr, trace  edema GI: BS+, soft, nontender Derm: no cyanosis or rash Psyche: normal mood and affect   October 2017 CT chest images personally reviewed showing bronchiectasis in the right middle lobe and lingula, stable left lower lobe nodule 3 mm  Hemoglobin from July 2017 was 10.7 g/dL     Assessment & Plan:  Bronchiectasis without acute exacerbation ([facility]) She has minimal disease seen on her CT scan which is unlikely to be in excellent condition for her dyspnea but does put her at increased risk for respiratory infections.  Plan: Hand hygiene encouraged Flu shot  Solitary pulmonary nodule Stable on imaging, no further imaging needed.  Dyspnea She has been expressing dyspnea in the last year which is limiting her exercise routine to some degree. I explained to her that the differential diagnosis is broad and includes cardiac disease, pulmonary disease, hematologic disease among others. In her particular case I do fear the possibility of anemia as she has some pallor on exam and history of anemia. Further, she has a significant cardiac history and she has been expressing some chest pains with think that needs to be evaluated as well.  Plan: Pulmonary function testing Exercise stress test CBC Follow-up 2-3 weeks after these results    Current Outpatient Prescriptions:  .  aspirin 81 MG tablet, Take 81 mg by mouth daily., Disp: ,  Rfl:  .  Calcium Carbonate-Vitamin D (CALCIUM + D PO), Take by mouth., Disp: , Rfl:  .  estradiol (ESTRING) 2 MG vaginal ring, Place 2 mg vaginally every 3 (three) months. follow package directions, Disp: 1 each, Rfl: 4

## 2016-05-01 NOTE — Patient Instructions (Signed)
We will arrange for a stress test, pulmonary function test and then see you back in couple weeks We will call you with the results of the bloodwork.

## 2016-05-01 NOTE — Assessment & Plan Note (Signed)
She has been expressing dyspnea in the last year which is limiting her exercise routine to some degree. I explained to her that the differential diagnosis is broad and includes cardiac disease, pulmonary disease, hematologic disease among others. In her particular case I do fear the possibility of anemia as she has some pallor on exam and history of anemia. Further, she has a significant cardiac history and she has been expressing some chest pains with think that needs to be evaluated as well.  Plan: Pulmonary function testing Exercise stress test CBC Follow-up 2-3 weeks after these results

## 2016-05-03 ENCOUNTER — Telehealth (HOSPITAL_COMMUNITY): Payer: Self-pay | Admitting: *Deleted

## 2016-05-03 ENCOUNTER — Telehealth: Payer: Self-pay | Admitting: Pulmonary Disease

## 2016-05-03 NOTE — Telephone Encounter (Signed)
A, Please let the patient know this showed mild anemia, but actually better than a year ago. Thanks, B  Called and spoke with pts husband and he stated that the pt is aware of these results from [place]. Nothing further is needed.

## 2016-05-03 NOTE — Telephone Encounter (Signed)
Patient given detailed instructions per Myocardial Perfusion Study Information Sheet for the test on 05/09/16. Patient notified to arrive 15 minutes early and that it is imperative to arrive on time for appointment to keep from having the test rescheduled.  If you need to cancel or reschedule your appointment, please call the office within 24 hours of your appointment. Failure to do so may result in a cancellation of your appointment, and a $50 no show fee. Patient verbalized understanding. Kirstie Peri

## 2016-05-09 ENCOUNTER — Ambulatory Visit (HOSPITAL_COMMUNITY): Payer: Medicare Other | Attending: Cardiology

## 2016-05-09 DIAGNOSIS — R0602 Shortness of breath: Secondary | ICD-10-CM | POA: Insufficient documentation

## 2016-05-09 DIAGNOSIS — Z8249 Family history of ischemic heart disease and other diseases of the circulatory system: Secondary | ICD-10-CM | POA: Insufficient documentation

## 2016-05-09 DIAGNOSIS — R079 Chest pain, unspecified: Secondary | ICD-10-CM | POA: Insufficient documentation

## 2016-05-09 LAB — MYOCARDIAL PERFUSION IMAGING
Estimated workload: 12.5 METS
Exercise duration (min): 10 min
Exercise duration (sec): 30 s
LV dias vol: 68 mL (ref 46–106)
LV sys vol: 18 mL
MPHR: 148 {beats}/min
Peak HR: 144 {beats}/min
Percent HR: 97 %
RATE: 0.25
RPE: 19
Rest HR: 50 {beats}/min
SDS: 2
SRS: 5
SSS: 7
TID: 0.96

## 2016-05-09 MED ORDER — TECHNETIUM TC 99M TETROFOSMIN IV KIT
32.9000 | PACK | Freq: Once | INTRAVENOUS | Status: AC | PRN
  Administered 2016-05-09: 32.9 via INTRAVENOUS
  Filled 2016-05-09: qty 33

## 2016-05-09 MED ORDER — TECHNETIUM TC 99M TETROFOSMIN IV KIT
10.4000 | PACK | Freq: Once | INTRAVENOUS | Status: AC | PRN
  Administered 2016-05-09: 10.4 via INTRAVENOUS
  Filled 2016-05-09: qty 11

## 2016-05-10 ENCOUNTER — Telehealth: Payer: Self-pay | Admitting: Pulmonary Disease

## 2016-05-10 NOTE — Telephone Encounter (Signed)
Notes Recorded by Juanito Doom, MD on 05/09/2016 at 11:58 PM EDT A, Overall this test looked normal but her EKG did change slightly with exercise. I would like for her to see cardiology to discuss this further. Thanks B  Pt aware of results and BQ recommendations.  Pt voiced understanding and states she is scheduled for PFT on 05-15-16 and would like to wait on the cardiology referral until after her PFT. Nothing further needed.

## 2016-05-15 ENCOUNTER — Encounter: Payer: Self-pay | Admitting: Adult Health

## 2016-05-15 ENCOUNTER — Ambulatory Visit (INDEPENDENT_AMBULATORY_CARE_PROVIDER_SITE_OTHER): Payer: Medicare Other | Admitting: Adult Health

## 2016-05-15 ENCOUNTER — Ambulatory Visit (HOSPITAL_COMMUNITY)
Admission: RE | Admit: 2016-05-15 | Discharge: 2016-05-15 | Disposition: A | Discharge status: Home / Self Care | Payer: Medicare Other | Source: Ambulatory Visit | Attending: Pulmonary Disease | Admitting: Pulmonary Disease

## 2016-05-15 VITALS — BP 100/60 | HR 60 | Temp 97.7°F | Ht 65.0 in | Wt 117.2 lb

## 2016-05-15 DIAGNOSIS — R942 Abnormal results of pulmonary function studies: Secondary | ICD-10-CM | POA: Insufficient documentation

## 2016-05-15 DIAGNOSIS — R0600 Dyspnea, unspecified: Secondary | ICD-10-CM | POA: Insufficient documentation

## 2016-05-15 DIAGNOSIS — J479 Bronchiectasis, uncomplicated: Secondary | ICD-10-CM | POA: Diagnosis not present

## 2016-05-15 LAB — PULMONARY FUNCTION TEST
DL/VA % pred: 71 %
DL/VA: 3.5 ml/min/mmHg/L
DLCO unc % pred: 62 %
DLCO unc: 16.02 ml/min/mmHg
FEF 25-75 Pre: 3 L/sec
FEF2575-%Pred-Pre: 161 %
FEV1-%Pred-Pre: 116 %
FEV1-Pre: 2.67 L
FEV1FVC-%Pred-Pre: 110 %
FEV6-%Pred-Pre: 110 %
FEV6-Pre: 3.22 L
FEV6FVC-%Pred-Pre: 104 %
FVC-%Pred-Pre: 105 %
FVC-Pre: 3.22 L
Pre FEV1/FVC ratio: 83 %
Pre FEV6/FVC Ratio: 100 %
RV % pred: 135 %
RV: 3.08 L
TLC % pred: 116 %
TLC: 6.06 L

## 2016-05-15 NOTE — Progress Notes (Signed)
Subjective:    Patient ID: Barbara Schwartz, female    DOB: 12/03/1945, 70 y.o.   MRN: [id number]  HPI 70 yo with remote smoking (college) history seen for pulmonary consult 2016 for dyspnea and abnormal CT chest .  Hx of Ovarian cancer   TEST  FOB 01/2015 neg cytology  October 2017 CT chest images personally reviewed showing bronchiectasis in the right middle lobe and lingula, stable left lower lobe nodule 3 mm  05/15/2016 Follow up : Dyspnea  Pt returns for a 2 week follow up for dyspnea. She had experienced more DOE over last year.  She is very active with tennis and exercise. Over last year she gets chest tightness and dyspnea when she exercises. She says she pushes thru this and symptom improve or resolve .  She was set up for a PFT today that showed normal lung function with FEV1 116%, ratio 83 , FVC 105% , decreased DLCO 62% . O2 sats on arrival to office today 100%.  CT chest done on 04/11/16 showed stable 3 mm LLL, RML and lingular scarring /bronchiectasis .  Stress test ok except for ekg change . EF 73%, ST segment depression in III, II  And aVF leads, returned to baseline after 5-9 min or recovery . Felt to be a low risk study. W/ normal perfusion images.  CBC showed mild anemia w/ hbg at 11.9 , improved from 1 yr ago.  She denies palpitations, orthopnea, edema , syncope, jaw pain, or n/v/d.  Pt has family hx of CAD with CHF .    Past Medical History:  Diagnosis Date  . Osteopenia 2015   T score -1. 4 FRAX 14%/2.5% noting prior Boniva use 2006 through 2011  . Ovarian cancer ([place]) 1990   Stage 3  . Squamous cell skin cancer    Current Outpatient Prescriptions on File Prior to Visit  Medication Sig Dispense Refill  . aspirin 81 MG tablet Take 81 mg by mouth daily.    . Calcium Carbonate-Vitamin D (CALCIUM + D PO) Take by mouth.    . estradiol (ESTRING) 2 MG vaginal ring Place 2 mg vaginally every 3 (three) months. follow package directions 1 each 4   No current  facility-administered medications on file prior to visit.      Review of Systems Constitutional:   No  weight loss, night sweats,  Fevers, chills, fatigue, or  lassitude.  HEENT:   No headaches,  Difficulty swallowing,  Tooth/dental problems, or  Sore throat,                No sneezing, itching, ear ache, nasal congestion, post nasal drip,   CV:  No chest pain,  Orthopnea, PND, swelling in lower extremities, anasarca, dizziness, palpitations, syncope.   GI  No heartburn, indigestion, abdominal pain, nausea, vomiting, diarrhea, change in bowel habits, loss of appetite, bloody stools.   Resp:    No excess mucus, no productive cough,  No non-productive cough,  No coughing up of blood.  No change in color of mucus.  No wheezing.  No chest wall deformity  Skin: no rash or lesions.  GU: no dysuria, change in color of urine, no urgency or frequency.  No flank pain, no hematuria   MS:  No joint pain or swelling.  No decreased range of motion.  No back pain.  Psych:  No change in mood or affect. No depression or anxiety.  No memory loss.  Objective:   Physical Exam Vitals:   05/15/16 1539  BP: 100/60  Pulse: (!) 48  Temp: 97.7 F (36.5 C)  TempSrc: Oral  SpO2: 100%  Weight: 117 lb 3.2 oz (53.2 kg)  Height: 5\' 5"  (1.651 m)   GEN: A/Ox3; pleasant , NAD, well nourished    HEENT:  [place]/AT,  EACs-clear, TMs-wnl, NOSE-clear, THROAT-clear, no lesions, no postnasal drip or exudate noted.   NECK:  Supple w/ fair ROM; no JVD; normal carotid impulses w/o bruits; no thyromegaly or nodules palpated; no lymphadenopathy.    RESP  Clear  P & A; w/o, wheezes/ rales/ or rhonchi. no accessory muscle use, no dullness to percussion  CARD:  RRR, no m/r/g  , no peripheral edema, pulses intact, no cyanosis or clubbing.  GI:   Soft & nt; nml bowel sounds; no organomegaly or masses detected.   Musco: Warm bil, no deformities or joint swelling noted.   Neuro: alert, no focal deficits noted.     Skin: Warm, no lesions or rashes  Barbara Inabinet NP-C  [facility]  05/15/2016        Assessment & Plan:

## 2016-05-15 NOTE — Patient Instructions (Signed)
Refer to cardiology .  Continue on current regimen  Follow up with Dr. Lake Bells in 1 year and As needed

## 2016-05-17 NOTE — Progress Notes (Signed)
Chart reviewed, agree with this plan of care. 

## 2016-05-17 NOTE — Assessment & Plan Note (Addendum)
Appears stable without flare . CT chest is stable  PFT shows preserved lung function with no restriction/obstruction .  There is a Diffusing defect . O2 sats at 100% on RA .  Cont to follow

## 2016-05-17 NOTE — Assessment & Plan Note (Signed)
DOE with exrecise ? Etiology  Workup has been unrevealing with stable CT chest , labs, PFT except for diffusing defect  Stress myoview felt low risk except for EKG changes. Would like for her to see cardiology for evaluation  Advised if symptoms persist or worsen and card eval is neg will need further evaluation    Plan  Patient Instructions  Refer to cardiology .  Continue on current regimen  Follow up with Dr. Lake Bells in 1 year and As needed

## 2016-06-08 ENCOUNTER — Encounter: Payer: Self-pay | Admitting: Cardiology

## 2016-06-11 ENCOUNTER — Ambulatory Visit (INDEPENDENT_AMBULATORY_CARE_PROVIDER_SITE_OTHER): Payer: Medicare Other | Admitting: Cardiology

## 2016-06-11 ENCOUNTER — Encounter: Payer: Self-pay | Admitting: Cardiology

## 2016-06-11 VITALS — BP 128/64 | HR 47 | Ht 65.0 in | Wt 114.8 lb

## 2016-06-11 DIAGNOSIS — I2510 Atherosclerotic heart disease of native coronary artery without angina pectoris: Secondary | ICD-10-CM | POA: Diagnosis not present

## 2016-06-11 DIAGNOSIS — R0609 Other forms of dyspnea: Secondary | ICD-10-CM | POA: Diagnosis not present

## 2016-06-11 DIAGNOSIS — R0600 Dyspnea, unspecified: Secondary | ICD-10-CM

## 2016-06-11 DIAGNOSIS — I2584 Coronary atherosclerosis due to calcified coronary lesion: Secondary | ICD-10-CM

## 2016-06-11 DIAGNOSIS — I700 Atherosclerosis of aorta: Secondary | ICD-10-CM

## 2016-06-11 NOTE — Progress Notes (Signed)
Cardiology Office Note    Date:  06/11/2016   ID:  Barbara Schwartz, DOB 1946-05-17, MRN [id number]  PCP:  Kelton Pillar, MD  Cardiologist:   Candee Furbish, MD     History of Present Illness:  Barbara Schwartz is a 70 y.o. female here for evaluation of dyspnea on exertion. She has been seen by pulmonary medicine, Leatha Gilding, NP. Workup has been unrevealing with stable CT of the chest, labs, PFTs except for diffusion defect. Stress Myoview was felt to be low risk. She did have low level ST segment depression, horizontal to upsloping that quickly resolved. Likely false positive in the setting of normal perfusion. She describes shortness of breath, occasional chest tightness sensation with exercise, heavy walking, playing tennis. She has not had any syncope, orthopnea, PND. Her ejection fraction was normal on stress test. She does not describe any significant lower extremity edema other than minor left ankle edema, has injured this in the past.  She had prior mild bronchiectasis in the right middle lobe.      Past Medical History:  Diagnosis Date  . Osteopenia 2015   T score -1. 4 FRAX 14%/2.5% noting prior Boniva use 2006 through 2011  . Ovarian cancer ([place]) 1990   Stage 3  . Squamous cell skin cancer     Past Surgical History:  Procedure Laterality Date  . ABDOMINAL HYSTERECTOMY  1990   TAH BSO  . Bowel obstruction    . OOPHORECTOMY     BSO  . Squamous cell and Basal cell excision    . VIDEO BRONCHOSCOPY Bilateral 02/15/2015   Procedure: VIDEO BRONCHOSCOPY WITH FLUORO;  Surgeon: Juanito Doom, MD;  Location: [place];  Service: Cardiopulmonary;  Laterality: Bilateral;    Current Medications: Outpatient Medications Prior to Visit  Medication Sig Dispense Refill  . aspirin 81 MG tablet Take 81 mg by mouth daily.    . Calcium Carbonate-Vitamin D (CALCIUM + D PO) Take 1 tablet by mouth daily.     Marland Kitchen estradiol (ESTRING) 2 MG vaginal ring Place 2 mg vaginally every  3 (three) months. follow package directions 1 each 4   No facility-administered medications prior to visit.      Allergies:   Albuterol and Xopenex [levalbuterol hcl]   Social History   Social History  . Marital status: Married    Spouse name: N/A  . Number of children: N/A  . Years of education: N/A   Social History Main Topics  . Smoking status: Former Smoker    Packs/day: 0.20    Years: 8.00    Types: Cigarettes    Quit date: 07/24/1967  . Smokeless tobacco: Never Used  . Alcohol use 1.8 oz/week    3 Standard drinks or equivalent per week  . Drug use: No  . Sexual activity: Yes    Birth control/ protection: Surgical     Comment: 1st intercourse 70 yo-Fewer than 5 partners   Other Topics Concern  . None   Social History Narrative  . None     Family History:  The patient's family history includes Breast cancer in her maternal grandmother and mother; Heart disease in her father; Ovarian cancer in her mother.   ROS:   Please see the history of present illness.    ROS All other systems reviewed and are negative.   PHYSICAL EXAM:   VS:  BP 128/64 (BP Location: Left Arm, Cuff Size: Normal)   Pulse (!) 47   Ht 5\' 5"  (  1.651 m)   Wt 114 lb 12.8 oz (52.1 kg)   LMP  (LMP Unknown)   BMI 19.10 kg/m    GEN: Thin, in no acute distress  HEENT: normal  Neck: no JVD, carotid bruits, or masses Cardiac: RRR; no murmurs, rubs, or gallops,no edema  Respiratory:  clear to auscultation bilaterally, normal work of breathing GI: soft, nontender, nondistended, + BS MS: no deformity or atrophy  Skin: warm and dry, no rash Neuro:  Alert and Oriented x 3, Strength and sensation are intact Psych: euthymic mood, full affect  Wt Readings from Last 3 Encounters:  06/11/16 114 lb 12.8 oz (52.1 kg)  05/15/16 117 lb 3.2 oz (53.2 kg)  05/09/16 113 lb (51.3 kg)      Studies/Labs Reviewed:   EKG:  EKG is ordered today.  The ekg ordered today demonstrates 06/11/16-sinus bradycardia rate  47, vertical axis, poor R-wave progression, no other obvious abnormalities.  Recent Labs: 05/01/2016: Hemoglobin 11.9; Platelets 227.0   Lipid Panel No results found for: CHOL, TRIG, HDL, CHOLHDL, VLDL, LDLCALC, LDLDIRECT  Additional studies/ records that were reviewed today include:   Nuclear stress test 05/09/16  Nuclear stress EF: 73%.  ST segment depression was noted during stress in the III, II and aVF leads, and returning to baseline after 5-9 minutes of recovery.  This is a low risk study.  The left ventricular ejection fraction is hyperdynamic (>65%).   Likely false positive ECG in recovery Normal perfusion images EF 73%  Personally reviewed ECG, horizontal to upsloping ST depression noted.  CT scan from 04/10/16 shows ostial RCA calcification as well as proximal LAD calcification, focal. Aortic atherosclerosis noted as well.  ASSESSMENT:    1. Dyspnea on exertion   2. Aortic atherosclerosis ([place])   3. Coronary artery calcification      PLAN:  In order of problems listed above:  Dyspnea  - Reassuring overall nuclear stress test with normal perfusion images.  - ST segment depression noted during exercise portion. Agree that this is likely false positive.  - CT scan did demonstrate focal calcification at the ostial RCA as well as proximal LAD distribution.There were no perfusion defects in this distribution on stress test. Reassuring.  - Reduced DLCO noted on PFTs. Addressed by pulmonary.  - For now, continue with prevention efforts. If her symptoms worsen or become more worrisome we can always pursue cardiac catheterization. She understands this. She wishes to call us as needed at this point. This seems reasonable.  Coronary artery calcification/aortic atherosclerosis   - consider statin therapy for plaque stabilization.  - She is on low-dose aspirin.    Medication Adjustments/Labs and Tests Ordered: Current medicines are reviewed at length with the patient  today.  Concerns regarding medicines are outlined above.  Medication changes, Labs and Tests ordered today are listed in the Patient Instructions below. Patient Instructions  Medication Instructions:  The current medical regimen is effective;  continue present plan and medications.  Follow-Up: Follow up as needed with Dr Marlou Porch.  If you need a refill on your cardiac medications before your next appointment, please call your pharmacy.  Thank you for choosing [facility]!!         Signed, Candee Furbish, MD  06/11/2016 10:44 AM    [facility] [place], [place], [place]  [address] Phone: [phone]; Fax: [phone]

## 2016-06-11 NOTE — Patient Instructions (Signed)
Medication Instructions:  The current medical regimen is effective;  continue present plan and medications.  Follow-Up: Follow up as needed with Dr Skains.  If you need a refill on your cardiac medications before your next appointment, please call your pharmacy.  Thank you for choosing [facility]!!     

## 2016-06-20 DIAGNOSIS — D049 Carcinoma in situ of skin, unspecified: Secondary | ICD-10-CM | POA: Diagnosis not present

## 2016-06-20 DIAGNOSIS — L570 Actinic keratosis: Secondary | ICD-10-CM | POA: Diagnosis not present

## 2016-07-21 IMAGING — CR DG CHEST 2V
2 series · 2 of 2 positions shown · non-contrast
Comparison: CT 01/26/2015.

CLINICAL DATA: Pulmonary nodule.  Cough.

EXAM:
CHEST  2 VIEW

[view not recorded (1 of 2)]
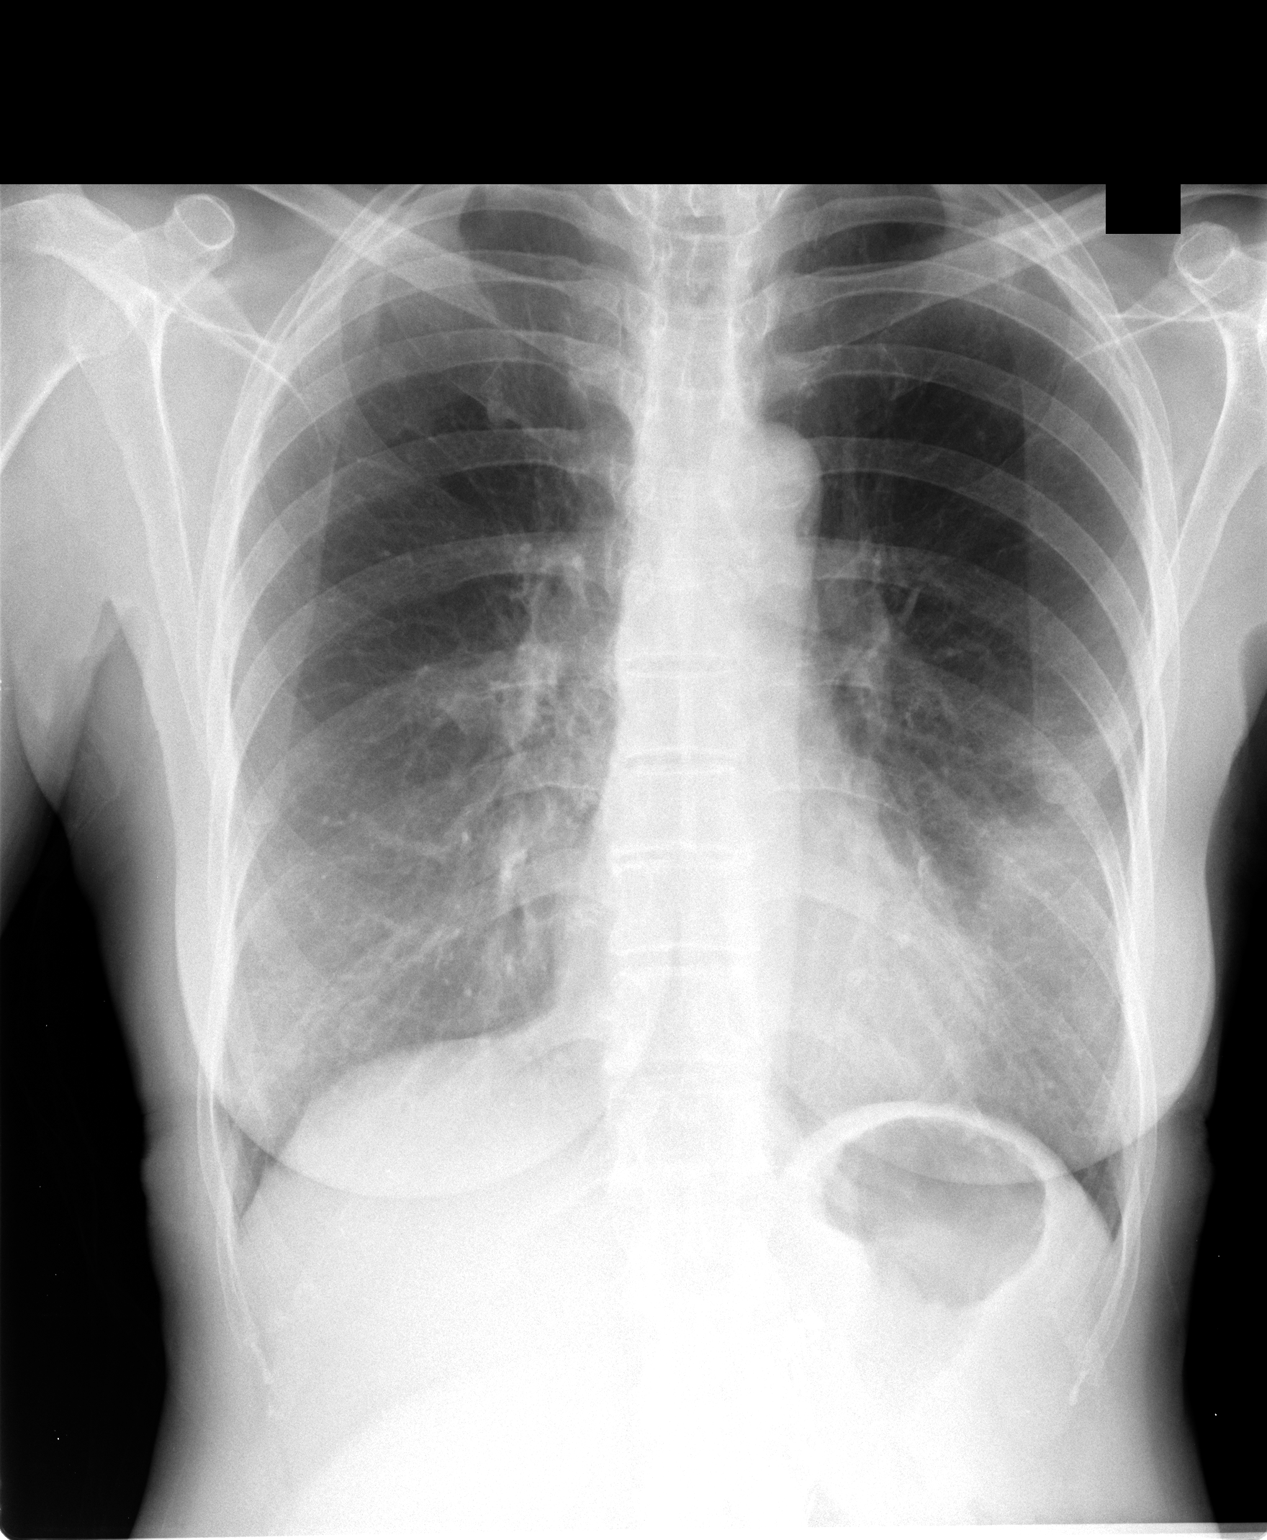

[view not recorded (2 of 2)]
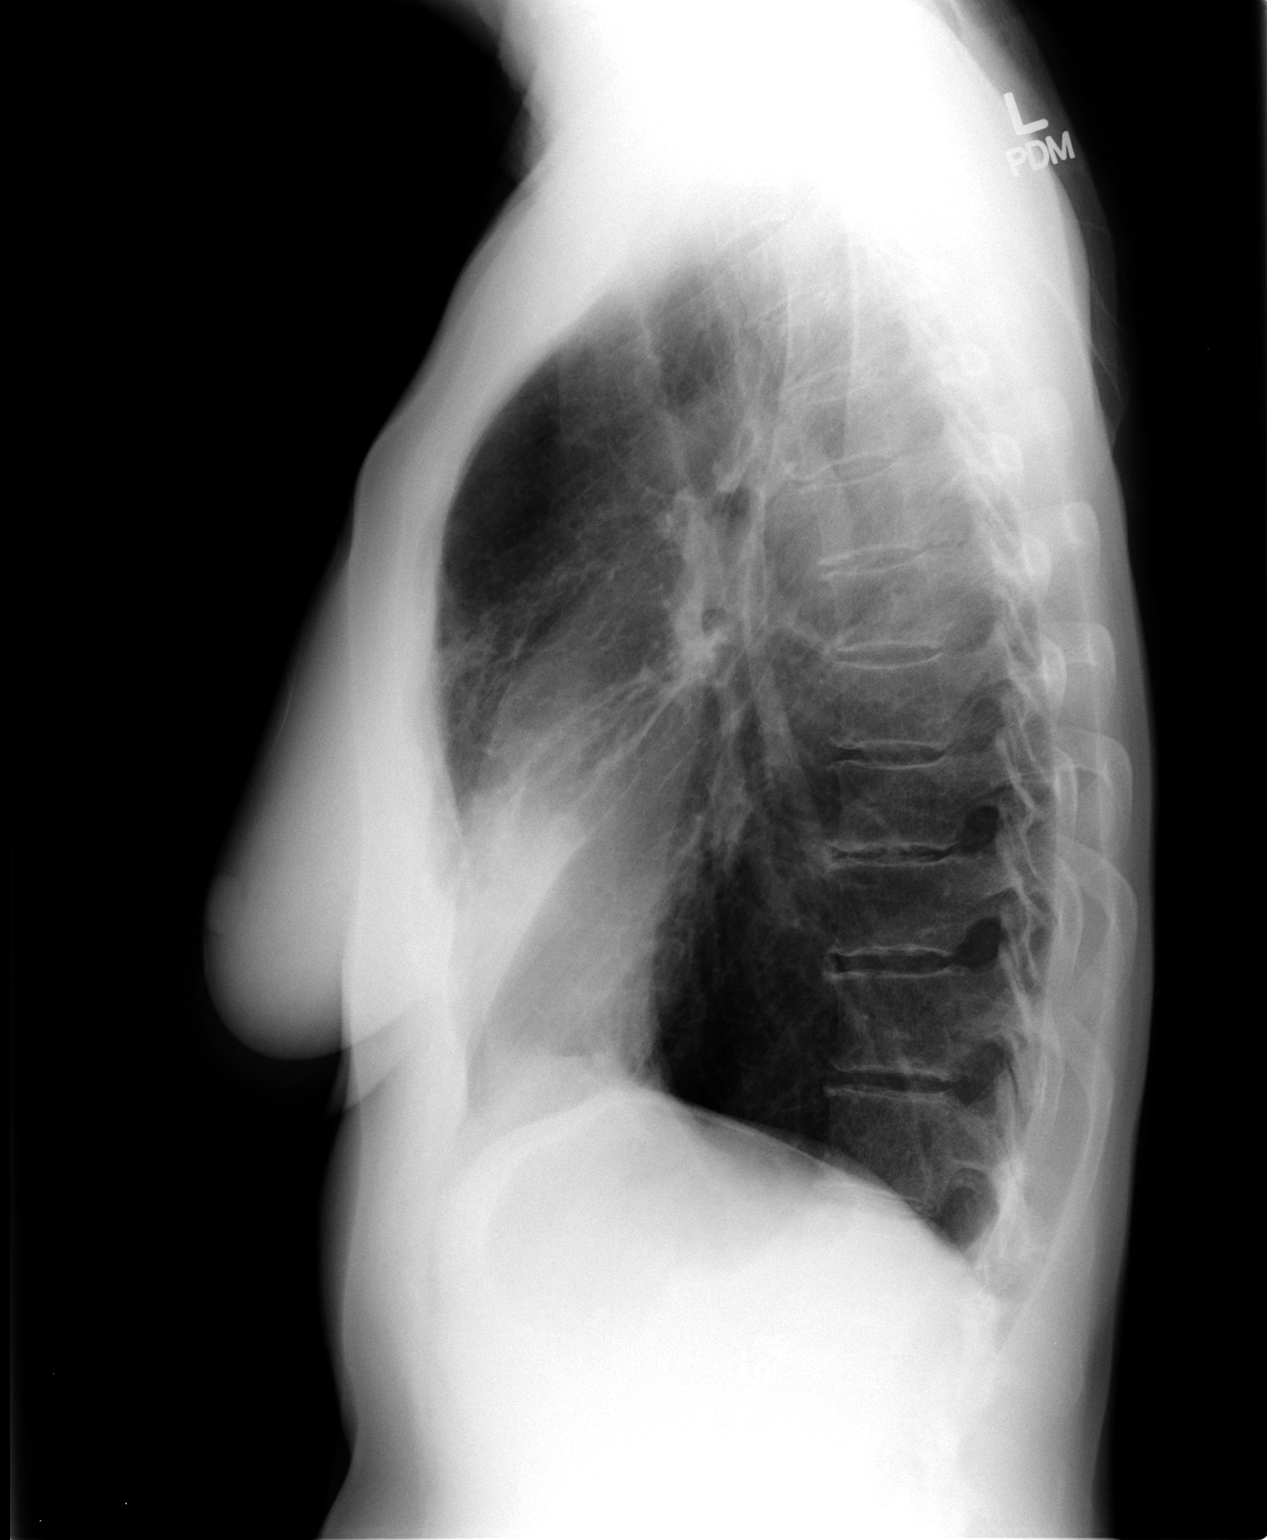

[2 of 2 positions shown; findings below may reference images not displayed]

FINDINGS: Mediastinum and hilar structures normal. Persistent bilateral
pulmonary infiltrates are again noted. Pneumonia could present in
this fashion. Atypical infection cannot be excluded. No pleural
effusion or pneumothorax. Heart size normal. No acute bony
abnormality.
IMPRESSION: Patchy bilateral pulmonary infiltrates are again noted as noted on
prior CT of 01/26/2015. Continued follow-up exams to demonstrate
complete clearing is suggested.

## 2016-11-07 ENCOUNTER — Other Ambulatory Visit: Payer: Self-pay | Admitting: Gynecology

## 2016-11-07 DIAGNOSIS — Z1231 Encounter for screening mammogram for malignant neoplasm of breast: Secondary | ICD-10-CM

## 2016-11-29 ENCOUNTER — Ambulatory Visit (INDEPENDENT_AMBULATORY_CARE_PROVIDER_SITE_OTHER): Payer: Medicare Other | Admitting: Gynecology

## 2016-11-29 ENCOUNTER — Encounter: Payer: Self-pay | Admitting: Gynecology

## 2016-11-29 VITALS — BP 112/64 | Ht 64.5 in | Wt 114.0 lb

## 2016-11-29 DIAGNOSIS — Z01411 Encounter for gynecological examination (general) (routine) with abnormal findings: Secondary | ICD-10-CM

## 2016-11-29 DIAGNOSIS — N952 Postmenopausal atrophic vaginitis: Secondary | ICD-10-CM

## 2016-11-29 DIAGNOSIS — M8580 Other specified disorders of bone density and structure, unspecified site: Secondary | ICD-10-CM

## 2016-11-29 DIAGNOSIS — C569 Malignant neoplasm of unspecified ovary: Secondary | ICD-10-CM | POA: Diagnosis not present

## 2016-11-29 MED ORDER — ESTRADIOL 2 MG VA RING: 2.0000 mg | VAGINAL_RING | VAGINAL | 4 refills | Status: DC

## 2016-11-29 NOTE — Patient Instructions (Signed)
Follow-up for the bone density as scheduled. 

## 2016-11-29 NOTE — Progress Notes (Signed)
    CALEA HRIBAR [birth year] [id number]        71 y.o.  G2P1011 for breast and pelvic exam  Past medical history,surgical history, problem list, medications, allergies, family history and social history were all reviewed and documented as reviewed in the EPIC chart.  ROS:  Performed with pertinent positives and negatives included in the history, assessment and plan.   Additional significant findings :  None   Exam: Caryn Bee assistant Vitals:   11/29/16 1354  BP: 112/64  Weight: 114 lb (51.7 kg)  Height: 5' 4.5" (1.638 m)   Body mass index is 19.27 kg/m.  General appearance:  Normal affect, orientation and appearance. Skin: Grossly normal HEENT: Without gross lesions.  No cervical or supraclavicular adenopathy. Thyroid normal.  Lungs:  Clear without wheezing, rales or rhonchi Cardiac: RR, without RMG Abdominal:  Soft, nontender, without masses, guarding, rebound, organomegaly or hernia Breasts:  Examined lying and sitting without masses, retractions, discharge or axillary adenopathy. Pelvic:  Ext, BUS, Vagina: With atrophic changes  Adnexa: Without masses or tenderness    Anus and perineum: Normal   Rectovaginal: Normal sphincter tone without palpated masses or tenderness.    Assessment/Plan:  71 y.o. G52P1011 female for breast and pelvic exam.   1. Postmenopausal/atrophic genital changes. Uses Estring 0.05 for atrophic vaginal changes. Does well and finds that it helps prevent UTIs. Refill 1 year provided. 2. Ovarian cancer, stage III status post TAH/BSO 1990. Exam NED. Check CA-125 today. 3. Osteopenia. DEXA 2015 T score -1.4 stable from prior DEXA. Had been on Boniva 2006 through 2011. Schedule DEXA now. 4. Mammography coming due in June and patient will schedule. SBE monthly reviewed. 5. Colonoscopy 2015. Repeat at their recommended interval. 6. Pap smear 2017. No Pap smear done today. 7. Health maintenance. No routine blood work done as patient does this  elsewhere. Follow up for bone density otherwise follow up in one year for annual exam   [name], 2:23 PM 11/29/2016

## 2016-11-30 LAB — CA 125: CA 125: 9 U/mL (ref <35)

## 2016-12-04 ENCOUNTER — Ambulatory Visit (INDEPENDENT_AMBULATORY_CARE_PROVIDER_SITE_OTHER): Payer: Medicare Other

## 2016-12-04 ENCOUNTER — Other Ambulatory Visit: Payer: Self-pay | Admitting: Dermatology

## 2016-12-04 ENCOUNTER — Other Ambulatory Visit: Payer: Self-pay | Admitting: Gynecology

## 2016-12-04 DIAGNOSIS — Z780 Asymptomatic menopausal state: Secondary | ICD-10-CM

## 2016-12-04 DIAGNOSIS — M8580 Other specified disorders of bone density and structure, unspecified site: Secondary | ICD-10-CM

## 2016-12-04 DIAGNOSIS — M8589 Other specified disorders of bone density and structure, multiple sites: Secondary | ICD-10-CM

## 2016-12-04 DIAGNOSIS — L570 Actinic keratosis: Secondary | ICD-10-CM | POA: Diagnosis not present

## 2016-12-04 DIAGNOSIS — D492 Neoplasm of unspecified behavior of bone, soft tissue, and skin: Secondary | ICD-10-CM | POA: Diagnosis not present

## 2016-12-05 ENCOUNTER — Telehealth: Payer: Self-pay | Admitting: Gynecology

## 2016-12-05 ENCOUNTER — Encounter: Payer: Self-pay | Admitting: Gynecology

## 2016-12-05 NOTE — Telephone Encounter (Signed)
Tell patient most recent bone density shows some osteopenia. I do not think there is significant change from the prior study. Given that she was treated with Boniva in the past my recommendation would be to monitor and repeat the bone density 2 years

## 2016-12-05 NOTE — Telephone Encounter (Signed)
Pt informed

## 2016-12-10 DIAGNOSIS — Z1389 Encounter for screening for other disorder: Secondary | ICD-10-CM | POA: Diagnosis not present

## 2016-12-10 DIAGNOSIS — Z0000 Encounter for general adult medical examination without abnormal findings: Secondary | ICD-10-CM | POA: Diagnosis not present

## 2016-12-10 DIAGNOSIS — E785 Hyperlipidemia, unspecified: Secondary | ICD-10-CM | POA: Diagnosis not present

## 2016-12-10 DIAGNOSIS — R296 Repeated falls: Secondary | ICD-10-CM | POA: Diagnosis not present

## 2016-12-10 DIAGNOSIS — E559 Vitamin D deficiency, unspecified: Secondary | ICD-10-CM | POA: Diagnosis not present

## 2016-12-10 DIAGNOSIS — Z23 Encounter for immunization: Secondary | ICD-10-CM | POA: Diagnosis not present

## 2016-12-14 ENCOUNTER — Ambulatory Visit: Payer: Medicare Other | Attending: Internal Medicine | Admitting: Physical Therapy

## 2016-12-14 ENCOUNTER — Encounter: Payer: Self-pay | Admitting: Physical Therapy

## 2016-12-14 DIAGNOSIS — M6281 Muscle weakness (generalized): Secondary | ICD-10-CM | POA: Insufficient documentation

## 2016-12-14 DIAGNOSIS — R296 Repeated falls: Secondary | ICD-10-CM | POA: Diagnosis not present

## 2016-12-14 DIAGNOSIS — R278 Other lack of coordination: Secondary | ICD-10-CM | POA: Insufficient documentation

## 2016-12-14 NOTE — Therapy (Addendum)
[facility] [address] [place], [place], [address] Phone: [phone]   Fax:  [phone]  Physical Therapy Evaluation  Patient Details  Name: Barbara Schwartz MRN: [id number] Date of Birth: 1946/07/10 Referring Provider: Emi Belfast  Encounter Date: 12/14/2016      PT End of Session - 12/14/16 0911    Visit Number 1   Number of Visits 10   Date for PT Re-Evaluation 02/08/17   Authorization Type gcodes at 10th visit   PT Start Time 0825   PT Stop Time 0905   PT Time Calculation (min) 40 min   Activity Tolerance Patient tolerated treatment well   Behavior During Therapy [facility] for tasks assessed/performed      Past Medical History:  Diagnosis Date  . Osteopenia 11/2016   T score -1.8 FRAX 16%/5.8% noting Boniva use 2000 12/23/2009  . Ovarian cancer ([place]) 1990   Stage 3  . Squamous cell skin cancer     Past Surgical History:  Procedure Laterality Date  . ABDOMINAL HYSTERECTOMY  1990   TAH BSO  . Bowel obstruction    . OOPHORECTOMY     BSO  . Squamous cell and Basal cell excision    . VIDEO BRONCHOSCOPY Bilateral 02/15/2015   Procedure: VIDEO BRONCHOSCOPY WITH FLUORO;  Surgeon: Juanito Doom, MD;  Location: [place];  Service: Cardiopulmonary;  Laterality: Bilateral;    There were no vitals filed for this visit.       Subjective Assessment - 12/14/16 0828    Subjective Have been falling more frequently when doing things like tennis, yoga, and biking.  I do hot yoga 1x/week, play tennis 2-3x week.  Not biking but taking spin class.  I have neuropathy in my feet from chemo therapy.  Falling and tripping over dog happens sometimes too.  I have always been clumsy my whole life.  I don't think my balance is bad, I just fall when I'm doing things   Limitations Other (comment)  exercise   Patient Stated Goals Keep doing the activities I'm doing, learn some things I can do at home   Currently  in Pain? No/denies            [facility] PT Assessment - 12/14/16 0001      Assessment   Medical Diagnosis R29.6 frequent falls   Referring Provider SCHOENHOFF, [facility]   Onset Date/Surgical Date --  not sure, I've always fallen   Prior Therapy no     Precautions   Precautions None     Restrictions   Weight Bearing Restrictions No     Balance Screen   Has the patient fallen in the past 6 months Yes   How many times? 4   Has the patient had a decrease in activity level because of a fear of falling?  No   Is the patient reluctant to leave their home because of a fear of falling?  No     Home Ecologist residence   Living Arrangements Spouse/significant other     Prior Function   Level of Independence Independent     Cognition   Overall Cognitive Status Within Functional Limits for tasks assessed     Observation/Other Assessments   Observations passes standar balance tests, but challenged by decreased base of support and higher level balance activities     Posture/Postural Control   Posture/Postural Control Postural limitations   Postural Limitations Forward head;Rounded Shoulders  genu varus  ROM / Strength   AROM / PROM / Strength Strength     Strength   Overall Strength Deficits   Overall Strength Comments left LE 4/5; right LE 5/5     Standardized Balance Assessment   Standardized Balance Assessment Berg Balance Test;Dynamic Gait Index;Five Times Sit to Stand   Five times sit to stand comments  11     Berg Balance Test   Sit to Stand Able to stand without using hands and stabilize independently   Standing Unsupported Able to stand safely 2 minutes   Sitting with Back Unsupported but Feet Supported on Floor or Stool Able to sit safely and securely 2 minutes   Stand to Sit Sits safely with minimal use of hands   Transfers Able to transfer safely, minor use of hands   Standing Unsupported with Eyes Closed Able to stand 10  seconds safely   Standing Ubsupported with Feet Together Able to place feet together independently and stand 1 minute safely   From Standing, Reach Forward with Outstretched Arm Can reach confidently >25 cm (10")   From Standing Position, Pick up Object from Floor Able to pick up shoe safely and easily   From Standing Position, Turn to Look Behind Over each Shoulder Looks behind from both sides and weight shifts well   Turn 360 Degrees Able to turn 360 degrees safely in 4 seconds or less   Standing Unsupported, Alternately Place Feet on Step/Stool Able to stand independently and safely and complete 8 steps in 20 seconds   Standing Unsupported, One Foot in Front Able to place foot tandem independently and hold 30 seconds   Standing on One Leg Able to lift leg independently and hold > 10 seconds   Total Score 56     Dynamic Gait Index   Level Surface Normal   Change in Gait Speed Normal   Gait with Horizontal Head Turns Normal   Gait with Vertical Head Turns Normal   Gait and Pivot Turn Normal   Step Over Obstacle Normal   Step Around Obstacles Normal   Steps Normal   Total Score 24                   [facility] Adult PT Treatment/Exercise - 12/14/16 0001      Neuro Re-ed    Neuro Re-ed Details  tandem standing and walking                PT Education - 12/14/16 0912    Education provided Yes   Education Details tandem stand and tandem walking   Person(s) Educated Patient   Methods Explanation;Demonstration;Verbal cues;Handout   Comprehension Verbalized understanding;Returned demonstration          PT Short Term Goals - 12/14/16 0933      PT SHORT TERM GOAL #1   Title independent with initial HEP   Time 4   Period Weeks   Status New     PT SHORT TERM GOAL #2   Title left hip abduction 5/5 MMT for improved stability   Time 4   Period Weeks   Status New           PT Long Term Goals - 12/14/16 8469      PT LONG TERM GOAL #1   Title independent  with advanced HEP and able to safely progress balance exercises at home   Time 8   Period Weeks   Status New     PT LONG TERM GOAL #2  Title Pt reports feeling 50% more stability during exercise classes and playing tennis   Time 8   Period Weeks   Status New     PT LONG TERM GOAL #3   Title Pt able to perform single leg stand with perturbations (opposite LE movements) for at least 10 sec in order to safely return to yoga class.   Time 8   Period Weeks   Status New               Plan - 12/14/16 0165    Clinical Impression Statement Patient presents to clinic due to repeated falls.  Pt is active 71 y/o female who reports falling about 2x/month and is here due to doctor request.  Patient demonstrates difficulty with single leg and narrow base of support.  She has left LE weakness of 4/5.  Pt has difficulty coordinating muscle firing patterns when placed on uneven surfaces and with narrow base of support.  Pt demonstrates posture deficits with increased thoracic kyphosis and rounded shoulders.  Pt demonstrates good balance based on standardized tests, but due to her activity level, she lacks balance needed to safely maintain those activities.  Patient will benefit from skilled PT to address impairments in order to safely participate in activities of healthy lifestyle.   Rehab Potential Excellent   Clinical Impairments Affecting Rehab Potential n/a   PT Frequency 1x / week   PT Duration 8 weeks   PT Treatment/Interventions ADLs/Self Care Home Management;Biofeedback;Cryotherapy;Electrical Stimulation;Moist Heat;Ultrasound;Therapeutic activities;Therapeutic exercise;Balance training;Neuromuscular re-education;Patient/family education;Manual techniques;Taping   PT Next Visit Plan challenge balance as tolerated in single leg and higher level activities, advance HEP as needed   Recommended Other Services n/a   Consulted and Agree with Plan of Care Patient      Patient will benefit from  skilled therapeutic intervention in order to improve the following deficits and impairments:  Decreased balance, Decreased strength, Postural dysfunction, Decreased coordination  Visit Diagnosis: Repeated falls - Plan: PT plan of care cert/re-cert  Muscle weakness (generalized) - Plan: PT plan of care cert/re-cert  Other lack of coordination - Plan: PT plan of care cert/re-cert  gcodes: Mobility walking: Berg and clinical reasoning  Current status: CI Goal status: [place]   Problem List Patient Active Problem List   Diagnosis Date Noted  . Dyspnea 05/01/2016  . Solitary pulmonary nodule 05/02/2015  . Adverse effects of medication 02/14/2015  . Cough 01/28/2015  . Bronchiectasis without acute exacerbation ([place]) 01/28/2015  . Ovarian cancer ([place])   . Osteopenia     Zannie Cove, PT 12/14/2016, 9:43 AM  [facility] [address] [place], [place], [address] Phone: [phone]   Fax:  [phone]  Name: Barbara Schwartz MRN: [id number] Date of Birth: 06/09/1946

## 2016-12-14 NOTE — Patient Instructions (Signed)
   One foot in front of the other, stand near stable surface Progress to walking

## 2016-12-26 ENCOUNTER — Ambulatory Visit
Admission: RE | Admit: 2016-12-26 | Discharge: 2016-12-26 | Disposition: A | Discharge status: Home / Self Care | Payer: Medicare Other | Source: Ambulatory Visit | Attending: Gynecology | Admitting: Gynecology

## 2016-12-26 DIAGNOSIS — Z1231 Encounter for screening mammogram for malignant neoplasm of breast: Secondary | ICD-10-CM

## 2017-01-01 ENCOUNTER — Ambulatory Visit: Payer: Medicare Other | Attending: Internal Medicine | Admitting: Physical Therapy

## 2017-01-01 ENCOUNTER — Encounter: Payer: Self-pay | Admitting: Physical Therapy

## 2017-01-01 DIAGNOSIS — R278 Other lack of coordination: Secondary | ICD-10-CM | POA: Diagnosis not present

## 2017-01-01 DIAGNOSIS — R296 Repeated falls: Secondary | ICD-10-CM | POA: Diagnosis not present

## 2017-01-01 DIAGNOSIS — M6281 Muscle weakness (generalized): Secondary | ICD-10-CM | POA: Insufficient documentation

## 2017-01-01 NOTE — Patient Instructions (Signed)
   BRIDGING ELASTIC BAND ABDUCTION  While lying on your back, place an elastic band around your knees and pull your knees apart. Hold this and then tighten your lower abdominals, squeeze your buttocks and raise your buttocks off the floor/bed as creating a "Bridge" with your body. Repeat 20x    Standing on foam mat or pillow hold in this position, turn head side to side, move other leg in 3 directions   One foot in front of the other and turn head side to side

## 2017-01-01 NOTE — Therapy (Addendum)
[facility] [address], [place], [place], [address] Phone: [phone]   Fax:  [phone]  Physical Therapy Treatment  Patient Details  Name: ADLEY CASTELLO MRN: [id number] Date of Birth: 01-20-1946 Referring Provider: Emi Belfast  Encounter Date: 01/01/2017      PT End of Session - 01/01/17 1451    Visit Number 2   Number of Visits 10   Date for PT Re-Evaluation 02/08/17   Authorization Type gcodes at 10th visit   PT Start Time 1446   PT Stop Time 1527   PT Time Calculation (min) 41 min   Activity Tolerance Patient tolerated treatment well   Behavior During Therapy [facility] for tasks assessed/performed      Past Medical History:  Diagnosis Date  . Osteopenia 11/2016   T score -1.8 FRAX 16%/5.8% noting Boniva use 2000 12/23/2009  . Ovarian cancer ([place]) 1990   Stage 3  . Squamous cell skin cancer     Past Surgical History:  Procedure Laterality Date  . ABDOMINAL HYSTERECTOMY  1990   TAH BSO  . Bowel obstruction    . BREAST BIOPSY Right 11/27/2002  . OOPHORECTOMY     BSO  . Squamous cell and Basal cell excision    . VIDEO BRONCHOSCOPY Bilateral 02/15/2015   Procedure: VIDEO BRONCHOSCOPY WITH FLUORO;  Surgeon: Juanito Doom, MD;  Location: [place];  Service: Cardiopulmonary;  Laterality: Bilateral;    There were no vitals filed for this visit.      Subjective Assessment - 01/01/17 1449    Subjective I have been more aware of tripping and how clumsy I am, but I haven't had any falls.  I have been doing less tennis and yoga because I pulled my hip and so I was taking this week off.    Limitations --  exercise   Patient Stated Goals Keep doing the activities I'm doing, learn some things I can do at home   Currently in Pain? No/denies                         [facility] Adult PT Treatment/Exercise - 01/01/17 0001      Neuro Re-ed    Neuro Re-ed Details  proprioception  with all exercises     Exercises   Exercises Knee/Hip     Knee/Hip Exercises: Aerobic   Nustep 6 min L1; incline 3  try using UE as little as possible     Knee/Hip Exercises: Standing   SLS standing on blue circle 1 min; then five with opposite LE 3 ways   Walking with Sports Cord 15# x 5 laps forward and backward   Other Standing Knee Exercises tandem walking, brading, side stepping yellow band     Knee/Hip Exercises: Supine   Bridges Strengthening;2 sets;15 reps   Bridges with Clamshell --  one set with red band                PT Education - 01/01/17 1529    Education provided Yes   Education Details bridge with band, tandem standing, single leg standing with head turns   Person(s) Educated Patient   Methods Explanation;Demonstration;Handout   Comprehension Verbalized understanding;Returned demonstration          PT Short Term Goals - 01/01/17 1534      PT SHORT TERM GOAL #1   Title independent with initial HEP   Time 4   Period Weeks   Status  On-going     PT SHORT TERM GOAL #2   Title left hip abduction 5/5 MMT for improved stability   Time 4   Period Weeks   Status On-going           PT Long Term Goals - 01/01/17 1534      PT LONG TERM GOAL #1   Title independent with advanced HEP and able to safely progress balance exercises at home   Time 8   Period Weeks   Status On-going     PT LONG TERM GOAL #2   Title Pt reports feeling 50% more stability during exercise classes and playing tennis   Time 8   Period Weeks   Status On-going     PT LONG TERM GOAL #3   Title Pt able to perform single leg stand with perturbations (opposite LE movements) for at least 10 sec in order to safely return to yoga class.   Time 8   Period Weeks   Status On-going               Plan - 01/01/17 1451    Clinical Impression Statement Patient was able to perform balancing exercises that challenged her in a safe way and able to progress HEP.  Pt  demonstrates weakness in glutes and hamstrings during walking with sport cord.  She will benefit from continued PT in order to increased stability and reduce risk of falls   Rehab Potential Excellent   PT Treatment/Interventions ADLs/Self Care Home Management;Biofeedback;Cryotherapy;Electrical Stimulation;Moist Heat;Ultrasound;Therapeutic activities;Therapeutic exercise;Balance training;Neuromuscular re-education;Patient/family education;Manual techniques;Taping   PT Next Visit Plan challenge balance as tolerated in single leg and higher level activities, advance HEP as needed   Consulted and Agree with Plan of Care Patient      Patient will benefit from skilled therapeutic intervention in order to improve the following deficits and impairments:  Decreased balance, Decreased strength, Postural dysfunction, Decreased coordination  Visit Diagnosis: Repeated falls  Muscle weakness (generalized)  Other lack of coordination     Problem List Patient Active Problem List   Diagnosis Date Noted  . Dyspnea 05/01/2016  . Solitary pulmonary nodule 05/02/2015  . Adverse effects of medication 02/14/2015  . Cough 01/28/2015  . Bronchiectasis without acute exacerbation ([place]) 01/28/2015  . Ovarian cancer ([place])   . Osteopenia     Zannie Cove, PT 01/01/2017, 7:25 PM  [facility] [address], [place], [place], [address] Phone: [phone]   Fax:  [phone]  Name: ARI ENGELBRECHT MRN: [id number] Date of Birth: [birth year]

## 2017-01-08 ENCOUNTER — Encounter: Payer: Self-pay | Admitting: Physical Therapy

## 2017-01-08 ENCOUNTER — Ambulatory Visit: Payer: Medicare Other | Admitting: Physical Therapy

## 2017-01-08 DIAGNOSIS — M6281 Muscle weakness (generalized): Secondary | ICD-10-CM

## 2017-01-08 DIAGNOSIS — R296 Repeated falls: Secondary | ICD-10-CM | POA: Diagnosis not present

## 2017-01-08 DIAGNOSIS — R278 Other lack of coordination: Secondary | ICD-10-CM

## 2017-01-08 NOTE — Patient Instructions (Signed)
   Set a mark on the wall and standing on one leg, keep eyes on that spot while turning the head side to side

## 2017-01-08 NOTE — Therapy (Signed)
[facility] [address], [place], [place], [address] Phone: [phone]   Fax:  [phone]  Physical Therapy Treatment  Patient Details  Name: Barbara Schwartz MRN: [id number] Date of Birth: November 01, 1945 Referring Provider: Emi Belfast  Encounter Date: 01/08/2017      PT End of Session - 01/08/17 1442    Visit Number 3   Number of Visits 10   Date for PT Re-Evaluation 02/08/17   Authorization Type gcodes at 10th visit   PT Start Time 6811   PT Stop Time 1525   PT Time Calculation (min) 40 min   Activity Tolerance Patient tolerated treatment well   Behavior During Therapy [facility] for tasks assessed/performed      Past Medical History:  Diagnosis Date  . Osteopenia 11/2016   T score -1.8 FRAX 16%/5.8% noting Boniva use 2000 12/23/2009  . Ovarian cancer ([place]) 1990   Stage 3  . Squamous cell skin cancer     Past Surgical History:  Procedure Laterality Date  . ABDOMINAL HYSTERECTOMY  1990   TAH BSO  . Bowel obstruction    . BREAST BIOPSY Right 11/27/2002  . OOPHORECTOMY     BSO  . Squamous cell and Basal cell excision    . VIDEO BRONCHOSCOPY Bilateral 02/15/2015   Procedure: VIDEO BRONCHOSCOPY WITH FLUORO;  Surgeon: Juanito Doom, MD;  Location: [place];  Service: Cardiopulmonary;  Laterality: Bilateral;    There were no vitals filed for this visit.      Subjective Assessment - 01/08/17 1452    Subjective I hurt my hip and back and have done less of everything and I am feeling better.     Patient Stated Goals Keep doing the activities I'm doing, learn some things I can do at home   Currently in Pain? No/denies                         [facility] Adult PT Treatment/Exercise - 01/08/17 0001      Neuro Re-ed    Neuro Re-ed Details  proprioception with all exercises, sinle leg stand with head turns while gazing at spot     Knee/Hip Exercises: Aerobic   Nustep 6 min L1; incline  4  try using UE as little as possible     Knee/Hip Exercises: Standing   Heel Raises Both  to to toe weight shifting on foam mat   Lateral Step Up 20 reps;Hand Hold: 0;Left;Right  BOSU (occasional UE)   Forward Step Up Right;Left;10 reps;Hand Hold: 0  BOSU (occasional UE) 5 sec pause at the top)   SLS standing on blue circle LE 3 ways   Walking with Sports Cord 20# x 5 laps forward and backward and side to side   Other Standing Knee Exercises tandem walking, brading, side stepping yellow band                PT Education - 01/08/17 1528    Education provided Yes   Education Details gaze stabilization with head turns   Person(s) Educated Patient   Methods Explanation;Demonstration;Handout   Comprehension Verbalized understanding;Returned demonstration          PT Short Term Goals - 01/08/17 1654      PT SHORT TERM GOAL #1   Title independent with initial HEP   Time 4   Period Weeks   Status Achieved     PT SHORT TERM GOAL #2   Title left  hip abduction 5/5 MMT for improved stability   Time 4   Period Weeks   Status Not Met           PT Long Term Goals - 01/22/2017 1654      PT LONG TERM GOAL #1   Title independent with advanced HEP and able to safely progress balance exercises at home   Time 8   Period Weeks   Status Achieved     PT LONG TERM GOAL #2   Title Pt reports feeling 50% more stability during exercise classes and playing tennis   Baseline has not played tennis   Time 8   Period Weeks   Status Not Met     PT LONG TERM GOAL #3   Title Pt able to perform single leg stand with perturbations (opposite LE movements) for at least 10 sec in order to safely return to yoga class.   Time 8   Period Weeks   Status Achieved               Plan - 2017/01/22 1442    Clinical Impression Statement Patient did well with balance exercises today.  Has the most difficulty with head turn exercises. Pt is demonstrating good balance and ability to  perform exercises at home.  She will continue to work on balance at home and discharge with HEP today   Rehab Potential Excellent   PT Treatment/Interventions ADLs/Self Care Home Management;Biofeedback;Cryotherapy;Electrical Stimulation;Moist Heat;Ultrasound;Therapeutic activities;Therapeutic exercise;Balance training;Neuromuscular re-education;Patient/family education;Manual techniques;Taping   PT Next Visit Plan discharged today   Consulted and Agree with Plan of Care Patient      Patient will benefit from skilled therapeutic intervention in order to improve the following deficits and impairments:  Decreased balance, Decreased strength, Postural dysfunction, Decreased coordination  Visit Diagnosis: Repeated falls  Muscle weakness (generalized)  Other lack of coordination       G-Codes - 01-22-17 1530    Functional Assessment Tool Used (Outpatient Only) BERG balance screen and clinical reasoning   Functional Limitation Mobility: Walking and moving around   Mobility: Walking and Moving Around Current Status ([id number]) At least 1 percent but less than 20 percent impaired, limited or restricted   Mobility: Walking and Moving Around Goal Status [phone]) 0 percent impaired, limited or restricted   Mobility: Walking and Moving Around Discharge Status [phone]) At least 1 percent but less than 20 percent impaired, limited or restricted      Problem List Patient Active Problem List   Diagnosis Date Noted  . Dyspnea 05/01/2016  . Solitary pulmonary nodule 05/02/2015  . Adverse effects of medication 02/14/2015  . Cough 01/28/2015  . Bronchiectasis without acute exacerbation ([place]) 01/28/2015  . Ovarian cancer ([place])   . Osteopenia     Zannie Cove, PT 01/22/2017, 4:55 PM  [facility] [address], [place], [place], [address] Phone: [phone]   Fax:  [phone]  Name: Barbara Schwartz MRN: [id number] Date of Birth:  1945/10/27  PHYSICAL THERAPY DISCHARGE SUMMARY  Visits from Start of Care: 3  Current functional level related to goals / functional outcomes: See above details   Remaining deficits: See above details   Education / Equipment:\ HEP  Plan: Patient agrees to discharge.  Patient goals were partially met. Patient is being discharged due to being pleased with the current functional level.  ?????  [employer], PT 22-Jan-2017 4:55 PM

## 2017-01-14 ENCOUNTER — Telehealth: Payer: Self-pay

## 2017-01-14 NOTE — Telephone Encounter (Signed)
Patient said she has had her mammo. She said in the past because of family hx and her personal history of ovarian cancer that you have ordered MRI.  She asked if you still recommend this and is so will you order it for her.

## 2017-01-14 NOTE — Telephone Encounter (Signed)
Spoke with husband and let him know to tell her that I received her message and we will be back with her this week with time/date for appt.

## 2017-01-14 NOTE — Telephone Encounter (Signed)
Referral note sent to [facility] to schedule.

## 2017-01-14 NOTE — Telephone Encounter (Signed)
Okay to schedule breast MRI. Reference strong family history of breast cancer in patient with personal history of ovarian cancer. Genetic testing declined

## 2017-01-15 ENCOUNTER — Encounter: Payer: Medicare Other | Admitting: Physical Therapy

## 2017-01-16 ENCOUNTER — Telehealth: Payer: Self-pay | Admitting: *Deleted

## 2017-01-16 DIAGNOSIS — Z803 Family history of malignant neoplasm of breast: Secondary | ICD-10-CM

## 2017-01-16 DIAGNOSIS — Z8543 Personal history of malignant neoplasm of ovary: Secondary | ICD-10-CM

## 2017-01-16 NOTE — Telephone Encounter (Signed)
Order placed at [facility] imaging they will contact pt to schedule. Pt aware of this as well.

## 2017-01-16 NOTE — Telephone Encounter (Signed)
-----   Message from Ramond Craver, [place] sent at 01/14/2017  2:47 PM EDT ----- Regarding: MRI Per Dr. Santina Evans to schedule breast MRI. Reference strong family history of breast cancer in patient with personal history of ovarian cancer. Genetic testing declined.  Thanks!

## 2017-01-17 NOTE — Telephone Encounter (Signed)
Appointment on 01/29/17 @ 4:00pm

## 2017-01-22 ENCOUNTER — Encounter: Payer: Medicare Other | Admitting: Physical Therapy

## 2017-01-28 DIAGNOSIS — H2513 Age-related nuclear cataract, bilateral: Secondary | ICD-10-CM | POA: Diagnosis not present

## 2017-01-29 ENCOUNTER — Ambulatory Visit
Admission: RE | Admit: 2017-01-29 | Discharge: 2017-01-29 | Disposition: A | Discharge status: Home / Self Care | Payer: Medicare Other | Source: Ambulatory Visit | Attending: Gynecology | Admitting: Gynecology

## 2017-01-29 ENCOUNTER — Encounter: Payer: Medicare Other | Admitting: Physical Therapy

## 2017-01-29 DIAGNOSIS — Z8543 Personal history of malignant neoplasm of ovary: Secondary | ICD-10-CM

## 2017-01-29 DIAGNOSIS — Z803 Family history of malignant neoplasm of breast: Secondary | ICD-10-CM

## 2017-01-29 DIAGNOSIS — N6489 Other specified disorders of breast: Secondary | ICD-10-CM | POA: Diagnosis not present

## 2017-01-29 MED ORDER — GADOBENATE DIMEGLUMINE 529 MG/ML IV SOLN
10.0000 mL | Freq: Once | INTRAVENOUS | Status: AC | PRN
  Administered 2017-01-29: 10 mL via INTRAVENOUS

## 2017-02-05 ENCOUNTER — Encounter: Payer: Medicare Other | Admitting: Physical Therapy

## 2017-02-12 ENCOUNTER — Encounter: Payer: Medicare Other | Admitting: Physical Therapy

## 2017-02-19 ENCOUNTER — Encounter: Payer: Medicare Other | Admitting: Physical Therapy

## 2017-04-29 DIAGNOSIS — Z23 Encounter for immunization: Secondary | ICD-10-CM | POA: Diagnosis not present

## 2017-05-16 ENCOUNTER — Ambulatory Visit (INDEPENDENT_AMBULATORY_CARE_PROVIDER_SITE_OTHER)
Admission: RE | Admit: 2017-05-16 | Discharge: 2017-05-16 | Disposition: A | Discharge status: Home / Self Care | Payer: Medicare Other | Source: Ambulatory Visit | Attending: Pulmonary Disease | Admitting: Pulmonary Disease

## 2017-05-16 ENCOUNTER — Ambulatory Visit (INDEPENDENT_AMBULATORY_CARE_PROVIDER_SITE_OTHER): Payer: Medicare Other | Admitting: *Deleted

## 2017-05-16 ENCOUNTER — Ambulatory Visit (INDEPENDENT_AMBULATORY_CARE_PROVIDER_SITE_OTHER): Payer: Medicare Other | Admitting: Pulmonary Disease

## 2017-05-16 ENCOUNTER — Encounter: Payer: Self-pay | Admitting: Pulmonary Disease

## 2017-05-16 ENCOUNTER — Other Ambulatory Visit (INDEPENDENT_AMBULATORY_CARE_PROVIDER_SITE_OTHER): Payer: Medicare Other

## 2017-05-16 VITALS — BP 120/70 | HR 59 | Ht 64.5 in | Wt 112.0 lb

## 2017-05-16 DIAGNOSIS — R0600 Dyspnea, unspecified: Secondary | ICD-10-CM

## 2017-05-16 DIAGNOSIS — R0609 Other forms of dyspnea: Secondary | ICD-10-CM | POA: Diagnosis not present

## 2017-05-16 DIAGNOSIS — R05 Cough: Secondary | ICD-10-CM | POA: Diagnosis not present

## 2017-05-16 DIAGNOSIS — R0602 Shortness of breath: Secondary | ICD-10-CM | POA: Diagnosis not present

## 2017-05-16 DIAGNOSIS — R059 Cough, unspecified: Secondary | ICD-10-CM

## 2017-05-16 DIAGNOSIS — J479 Bronchiectasis, uncomplicated: Secondary | ICD-10-CM

## 2017-05-16 LAB — CBC WITH DIFFERENTIAL/PLATELET
Basophils Absolute: 0.1 10*3/uL (ref 0.0–0.1)
Basophils Relative: 1.4 % (ref 0.0–3.0)
Eosinophils Absolute: 0.2 10*3/uL (ref 0.0–0.7)
Eosinophils Relative: 2.9 % (ref 0.0–5.0)
HCT: 40.1 % (ref 36.0–46.0)
Hemoglobin: 12.9 g/dL (ref 12.0–15.0)
Lymphocytes Relative: 35.2 % (ref 12.0–46.0)
Lymphs Abs: 2 10*3/uL (ref 0.7–4.0)
MCHC: 32 g/dL (ref 30.0–36.0)
MCV: 85.6 fl (ref 78.0–100.0)
Monocytes Absolute: 0.6 10*3/uL (ref 0.1–1.0)
Monocytes Relative: 10.1 % (ref 3.0–12.0)
Neutro Abs: 2.8 10*3/uL (ref 1.4–7.7)
Neutrophils Relative %: 50.4 % (ref 43.0–77.0)
Platelets: 248 10*3/uL (ref 150.0–400.0)
RBC: 4.69 Mil/uL (ref 3.87–5.11)
RDW: 14.4 % (ref 11.5–15.5)
WBC: 5.6 10*3/uL (ref 4.0–10.5)

## 2017-05-16 NOTE — Progress Notes (Signed)
SIX MIN WALK 05/16/2017  Medications 7am, ASA, magnesium, Calcium  Supplimental Oxygen during Test? (L/min) No  Laps 8  Partial Lap (in Meters) 16  Baseline BP (sitting) 96/58  Baseline Heartrate 46  Baseline Dyspnea (Borg Scale) 0  Baseline Fatigue (Borg Scale) 0  Baseline SPO2 100  BP (sitting) 120/62  Heartrate 61  Dyspnea (Borg Scale) 0  Fatigue (Borg Scale) 0  SPO2 99  BP (sitting) 110/58  Heartrate 48  SPO2 100  Stopped or Paused before Six Minutes No  Distance Completed 400  Tech Comments: TA/CMA

## 2017-05-16 NOTE — Patient Instructions (Signed)
Shortness of breath: We will check a chest x-ray today We will check a CBC or blood count to check your hemoglobin level today We will arrange for an echocardiogram to evaluate for a condition called pulmonary hypertension We will check a 6-minute walk test and measure your oxygen during that time  Cough: Start with CXR today  I will see you back in 1-2 weeks to go over these results

## 2017-05-16 NOTE — Progress Notes (Signed)
Subjective:    Patient ID: Barbara Schwartz, female    DOB: 10/02/1945, 71 y.o.   MRN: [id number]   Synopsis: This is a lady who smoked very briefly in college and has a distant history of ovarian cancer who came to our clinic in 2016 for evaluation of cough with an abnormal chest x-ray. The chest CT demonstrated consolidative findings in a patchy distribution primarily in the right middle lobe as well as some in the lingula. A bronchoscopy was performed in July 2016 which was unremarkable.  HPI Chief Complaint  Patient presents with  . Follow-up    pt c/o sob with activities. does note a nonprod cough.     Barbara Schwartz says she is better in general but she still feels short of breath: > she notices it when she exercises and plays tennis > she feels like she is limited by the dyspnea, specifically she can't play a third set of tennis now, could a year ago > she has to stop because she is breathing heavy, feels like she can't get enough air in > she feels like she doesn't have the breath she did a year ago > no tightness or wheezing.  Perhaps rare tightness > she is able to 45 min to 1 hour> typically she will put out 250-300 calories  Cough: > non-productive > has been there for month > worse with exercise or cough  Past Medical History:  Diagnosis Date  . Osteopenia 11/2016   T score -1.8 FRAX 16%/5.8% noting Boniva use 2000 12/23/2009  . Ovarian cancer ([place]) 1990   Stage 3  . Squamous cell skin cancer       Review of Systems  Constitutional: Positive for fatigue. Negative for chills and fever.  HENT: Negative for postnasal drip, rhinorrhea and sinus pressure.   Respiratory: Positive for shortness of breath. Negative for cough and wheezing.   Cardiovascular: Positive for chest pain. Negative for palpitations and leg swelling.       Objective:   Physical Exam Vitals:   05/16/17 0928  BP: 120/70  Pulse: (!) 59  SpO2: 100%  Weight: 112 lb (50.8 kg)  Height: 5' 4.5"  (1.638 m)   RA  Gen: well appearing HENT: OP clear, TM's clear, neck supple PULM: CTA B, normal percussion CV: RRR, no mgr, trace edema GI: BS+, soft, nontender Derm: no cyanosis or rash Psyche: normal mood and affect   Chest imaging: October 2017 CT chest images personally reviewed showing bronchiectasis in the right middle lobe and lingula, stable left lower lobe nodule 3 mm  Pulmonary function test: October 2017 ratio 83%, FEV1 2.7 L 116% predicted, FVC 3.2 L 105% predicted, total lung capacity 6.06 L 116% predicted, DLCO 16.0-62% predicted  CBC    Component Value Date/Time   WBC 8.3 05/01/2016 1425   RBC 4.32 05/01/2016 1425   HGB 11.9 (L) 05/01/2016 1425   HCT 36.0 05/01/2016 1425   PLT 227.0 05/01/2016 1425   MCV 83.2 05/01/2016 1425   MCHC 33.0 05/01/2016 1425   RDW 14.0 05/01/2016 1425   LYMPHSABS 2.1 05/01/2016 1425   MONOABS 0.7 05/01/2016 1425   EOSABS 0.1 05/01/2016 1425   BASOSABS 0.0 05/01/2016 1425     Hemoglobin from July 2017 was 10.7 g/dL     Assessment & Plan:   Dyspnea, unspecified type - Plan: ECHOCARDIOGRAM COMPLETE, DG Chest 2 View, CBC w/Diff  Dyspnea on exertion  Cough  Discussion: I am concerned about Barbara Schwartz because she is noticed  an objective decline in her exercise tolerance over the last year.  Specifically she says she cannot complete a third set of tennis and she is noticing that her ability to run distances has dropped off.  A year ago she was noted to have some mild diffusion deficit of undetermined etiology.  We know she has some bronchiectasis on her CT chest but her lung function test is otherwise remarkably normal.  I like to assess for pulmonary hypertension given the diffusion deficit, so we will check an echocardiogram.  We will also evaluate her anemia with a CBC and get a chest x-ray.  I also like to get a 6-minute walk to get some objective assessment of her exercise tolerance.  If these tests are normal then we will likely  need to arrange for a cardiopulmonary exercise stress test to get to the bottom of things.  Plan: Shortness of breath: We will check a chest x-ray today We will check a CBC or blood count to check your hemoglobin level today We will arrange for an echocardiogram to evaluate for a condition called pulmonary hypertension We will check a 6-minute walk test and measure your oxygen during that time  Cough: Start with CXR today  I will see you back in 1-2 weeks to go over these results    Current Outpatient Prescriptions:  .  aspirin 81 MG tablet, Take 81 mg by mouth daily., Disp: , Rfl:  .  Calcium Carbonate-Vitamin D (CALCIUM + D PO), Take 1 tablet by mouth daily. , Disp: , Rfl:  .  estradiol (ESTRING) 2 MG vaginal ring, Place 2 mg vaginally every 3 (three) months. follow package directions, Disp: 1 each, Rfl: 4 .  OVER THE COUNTER MEDICATION, Potassium and magnesium, Disp: , Rfl:

## 2017-05-17 ENCOUNTER — Ambulatory Visit (HOSPITAL_COMMUNITY): Payer: Medicare Other | Attending: Cardiovascular Disease

## 2017-05-17 ENCOUNTER — Other Ambulatory Visit: Payer: Self-pay

## 2017-05-17 DIAGNOSIS — R0600 Dyspnea, unspecified: Secondary | ICD-10-CM

## 2017-05-20 ENCOUNTER — Other Ambulatory Visit: Payer: Self-pay

## 2017-05-20 DIAGNOSIS — R0609 Other forms of dyspnea: Secondary | ICD-10-CM

## 2017-05-20 DIAGNOSIS — R0600 Dyspnea, unspecified: Secondary | ICD-10-CM

## 2017-05-20 DIAGNOSIS — R931 Abnormal findings on diagnostic imaging of heart and coronary circulation: Secondary | ICD-10-CM

## 2017-05-27 ENCOUNTER — Ambulatory Visit: Payer: Medicare Other | Admitting: Pulmonary Disease

## 2017-06-06 ENCOUNTER — Other Ambulatory Visit: Payer: Self-pay

## 2017-06-06 ENCOUNTER — Encounter (HOSPITAL_COMMUNITY): Payer: Self-pay | Admitting: Internal Medicine

## 2017-06-06 ENCOUNTER — Ambulatory Visit (HOSPITAL_COMMUNITY)
Admission: RE | Admit: 2017-06-06 | Discharge: 2017-06-06 | Disposition: A | Discharge status: Home / Self Care | Payer: Medicare Other | Source: Ambulatory Visit | Attending: Internal Medicine | Admitting: Internal Medicine

## 2017-06-06 VITALS — BP 123/59 | HR 61 | Wt 113.0 lb

## 2017-06-06 DIAGNOSIS — M8580 Other specified disorders of bone density and structure, unspecified site: Secondary | ICD-10-CM | POA: Insufficient documentation

## 2017-06-06 DIAGNOSIS — Z85828 Personal history of other malignant neoplasm of skin: Secondary | ICD-10-CM | POA: Insufficient documentation

## 2017-06-06 DIAGNOSIS — Z8249 Family history of ischemic heart disease and other diseases of the circulatory system: Secondary | ICD-10-CM | POA: Insufficient documentation

## 2017-06-06 DIAGNOSIS — Z8041 Family history of malignant neoplasm of ovary: Secondary | ICD-10-CM | POA: Diagnosis not present

## 2017-06-06 DIAGNOSIS — J479 Bronchiectasis, uncomplicated: Secondary | ICD-10-CM | POA: Diagnosis not present

## 2017-06-06 DIAGNOSIS — Z8543 Personal history of malignant neoplasm of ovary: Secondary | ICD-10-CM | POA: Diagnosis not present

## 2017-06-06 DIAGNOSIS — I2584 Coronary atherosclerosis due to calcified coronary lesion: Secondary | ICD-10-CM | POA: Diagnosis not present

## 2017-06-06 DIAGNOSIS — Z803 Family history of malignant neoplasm of breast: Secondary | ICD-10-CM | POA: Diagnosis not present

## 2017-06-06 DIAGNOSIS — Z87891 Personal history of nicotine dependence: Secondary | ICD-10-CM | POA: Diagnosis not present

## 2017-06-06 DIAGNOSIS — I2510 Atherosclerotic heart disease of native coronary artery without angina pectoris: Secondary | ICD-10-CM

## 2017-06-06 DIAGNOSIS — R0609 Other forms of dyspnea: Secondary | ICD-10-CM | POA: Diagnosis not present

## 2017-06-06 DIAGNOSIS — Z7982 Long term (current) use of aspirin: Secondary | ICD-10-CM | POA: Insufficient documentation

## 2017-06-06 DIAGNOSIS — R0600 Dyspnea, unspecified: Secondary | ICD-10-CM | POA: Diagnosis not present

## 2017-06-06 DIAGNOSIS — Z888 Allergy status to other drugs, medicaments and biological substances status: Secondary | ICD-10-CM | POA: Diagnosis not present

## 2017-06-06 DIAGNOSIS — R0789 Other chest pain: Secondary | ICD-10-CM | POA: Diagnosis not present

## 2017-06-06 NOTE — Patient Instructions (Signed)
Your physician has requested that you have cardiac CT. Cardiac computed tomography (CT) is a painless test that uses an x-ray machine to take clear, detailed pictures of your heart. For further information please visit [email or web address]. Please follow instruction sheet as given.  We will contact you in 3 months to schedule your next appointment.

## 2017-06-06 NOTE — Progress Notes (Addendum)
ADVANCED HF CLINIC CONSULT NOTE  Referring Physician: Primary Care: Primary Cardiologist:  HPI:  Ms. 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 Lake Bells due to progressive exertional dyspnea.  Minimal PMHx except for ovarian CA.   Last year developed PNA and was treated. Since that time has noted progressive exertional dyspnea despite which she has remained fairly active. Dyspnea only happens when she exercises. Typically does spin classes for 1hr 3x/week, plays tennis and does hot yoga. Now gets SOB going up steps. Chest also gets tight when she exercise. Recently cycling in [place] and note dyspnea at top. + chronic cough.  Has had extensive w/u:  In 10/17 had exercise myoview:  Nuclear stress EF: 73%.  ST segment depression was noted during stress in the III, II and aVF leads, and returning to baseline after 5-9 minutes of recovery.  This is a low risk study.  The left ventricular ejection fraction is hyperdynamic (>65%).  Normal images  CT scan: No PE. Mild scarring/bronchiectasis. + coronary calcifications  Echo 10/18: EF 60% grade II DD. Normal RV no PAH  CXR: mild hyperexpansion. No infiltrate  PFTs: Normal spirometry. Decreased DLCO Increased volumes  Very strong Fhx of CAD on father's side   Review of Systems: [y] = yes, [ ]  = no   General: Weight gain [ ] ; Weight loss [ ] ; Anorexia [ ] ; Fatigue [ ] ; Fever [ ] ; Chills [ ] ; Weakness [ ]   Cardiac: Chest pain/pressure [ ] ; Resting SOB [ ] ; Exertional SOB Blue.Reese ]; Orthopnea [ ] ; Pedal Edema [ ] ; Palpitations [ ] ; Syncope [ ] ; Presyncope [ ] ; Paroxysmal nocturnal dyspnea[ ]   Pulmonary: Cough [ ] ; Wheezing[ ] ; Hemoptysis[ ] ; Sputum [ ] ; Snoring [ ]   GI: Vomiting[ ] ; Dysphagia[ ] ; Melena[ ] ; Hematochezia [ ] ; Heartburn[ ] ; Abdominal pain [ ] ; Constipation [ ] ; Diarrhea [ ] ; BRBPR [ ]   GU: Hematuria[ ] ; Dysuria [ ] ; Nocturia[ ]   Vascular: Pain in legs with walking  [ ] ; Pain in feet with lying flat [ ] ; Non-healing sores [ ] ; Stroke [ ] ; TIA [ ] ; Slurred speech [ ] ;  Neuro: Headaches[ ] ; Vertigo[ ] ; Seizures[ ] ; Paresthesias[ ] ;Blurred vision [ ] ; Diplopia [ ] ; Vision changes [ ]   Ortho/Skin: Arthritis [ ] ; Joint pain [ ] ; Muscle pain [ ] ; Joint swelling [ ] ; Back Pain [ ] ; Rash [ ]   Psych: Depression[ ] ; Anxiety[ ]   Heme: Bleeding problems [ ] ; Clotting disorders [ ] ; Anemia [ ]   Endocrine: Diabetes [ ] ; Thyroid dysfunction[ ]    Past Medical History:  Diagnosis Date  . Osteopenia 11/2016   T score -1.8 FRAX 16%/5.8% noting Boniva use 2000 12/23/2009  . Ovarian cancer ([place]) 1990   Stage 3  . Squamous cell skin cancer     Current Outpatient Medications  Medication Sig Dispense Refill  . aspirin 81 MG tablet Take 81 mg by mouth daily.    . Calcium Carbonate-Vitamin D (CALCIUM + D PO) Take 1 tablet by mouth daily.     Marland Kitchen estradiol (ESTRING) 2 MG vaginal ring Place 2 mg vaginally every 3 (three) months. follow package directions 1 each 4  . OVER THE COUNTER MEDICATION Potassium and magnesium     No current facility-administered medications for this encounter.     Allergies  Allergen Reactions  . Albuterol Palpitations  . Xopenex [Levalbuterol Hcl] Palpitations    Pt with extreme nervousness , shakes and weakness after  breathing treatment.       Social History   Socioeconomic History  . Marital status: Married    Spouse name: Not on file  . Number of children: Not on file  . Years of education: Not on file  . Highest education level: Not on file  Social Needs  . Financial resource strain: Not on file  . Food insecurity - worry: Not on file  . Food insecurity - inability: Not on file  . Transportation needs - medical: Not on file  . Transportation needs - non-medical: Not on file  Occupational History  . Not on file  Tobacco Use  . Smoking status: Former Smoker    Packs/day: 0.20    Years: 8.00    Pack years: 1.60    Types:  Cigarettes    Last attempt to quit: 07/24/1967    Years since quitting: 49.9  . Smokeless tobacco: Never Used  Substance and Sexual Activity  . Alcohol use: Yes    Alcohol/week: 1.8 oz    Types: 3 Standard drinks or equivalent per week  . Drug use: No  . Sexual activity: Not Currently    Birth control/protection: Surgical    Comment: 1st intercourse 71 yo-Fewer than 5 partners  Other Topics Concern  . Not on file  Social History Narrative  . Not on file      Family History  Problem Relation Age of Onset  . Breast cancer Mother        43's  . Ovarian cancer Mother   . Heart disease Father   . Breast cancer Maternal Grandmother        Age 30's    Vitals:   06/06/17 1049  BP: (!) 123/59  Pulse: 61  SpO2: 100%  Weight: 113 lb (51.3 kg)    PHYSICAL EXAM: General:  Well appearing. No respiratory difficulty HEENT: normal Neck: supple. no JVD. Carotids 2+ bilat; no bruits. No lymphadenopathy or thryomegaly appreciated. Cor: PMI nondisplaced. Loletha Grayer regular. No rubs, gallops or murmurs. Lungs: clear Abdomen: soft, nontender, nondistended. No hepatosplenomegaly. No bruits or masses. Good bowel sounds. Extremities: no cyanosis, clubbing, rash, edema Neuro: alert & oriented x 3, cranial nerves grossly intact. moves all 4 extremities w/o difficulty. Affect pleasant.  ECG: NSR No ST-T wave abnormalities.   ASSESSMENT & PLAN:  1. Exertional dyspnea and chest pressure:  - her symptoms are classic for progressive angina. And this is supported by strong FHX of CAD and coronary calcium on recent CT chest.  - I have reviewed all her studies and cannot find any other explanation for her dyspnea - I also reviewed her nuclear stress test from last year. ECG was + but I also think there is subtle reversible defect in anteroapical/septal area that is not breast attenuation - Discussed need for cath or cardiac CTA to clearly assess coronaries. She would like to proceed with CTA. We will  schedule this asap. Continue ASA. - If no CAD with pursue RHC.  2. Mild bronchiectasis and air-trapping - Followed by Dr. Lake Bells.I do not think this explain her symptoms.  Glori Bickers, MD  2:01 PM

## 2017-06-10 ENCOUNTER — Other Ambulatory Visit (HOSPITAL_COMMUNITY): Payer: Self-pay

## 2017-06-10 DIAGNOSIS — I2584 Coronary atherosclerosis due to calcified coronary lesion: Secondary | ICD-10-CM

## 2017-06-10 DIAGNOSIS — I2510 Atherosclerotic heart disease of native coronary artery without angina pectoris: Secondary | ICD-10-CM

## 2017-06-11 ENCOUNTER — Other Ambulatory Visit (HOSPITAL_COMMUNITY): Payer: Self-pay | Admitting: *Deleted

## 2017-06-11 DIAGNOSIS — R0600 Dyspnea, unspecified: Secondary | ICD-10-CM

## 2017-06-18 ENCOUNTER — Ambulatory Visit (HOSPITAL_COMMUNITY)
Admission: RE | Admit: 2017-06-18 | Discharge: 2017-06-18 | Disposition: A | Discharge status: Home / Self Care | Payer: Medicare Other | Source: Ambulatory Visit | Attending: Internal Medicine | Admitting: Internal Medicine

## 2017-06-18 DIAGNOSIS — R0600 Dyspnea, unspecified: Secondary | ICD-10-CM | POA: Diagnosis not present

## 2017-06-18 LAB — BASIC METABOLIC PANEL
Anion gap: 6 (ref 5–15)
BUN: 24 mg/dL — ABNORMAL HIGH (ref 6–20)
CO2: 30 mmol/L (ref 22–32)
Calcium: 9.8 mg/dL (ref 8.9–10.3)
Chloride: 102 mmol/L (ref 101–111)
Creatinine, Ser: 0.97 mg/dL (ref 0.44–1.00)
GFR calc Af Amer: >60 mL/min (ref >60)
GFR calc non Af Amer: 57 mL/min — ABNORMAL LOW (ref >60)
Glucose, Bld: 112 mg/dL — ABNORMAL HIGH (ref 65–99)
Potassium: 4.1 mmol/L (ref 3.5–5.1)
Sodium: 138 mmol/L (ref 135–145)

## 2017-06-24 ENCOUNTER — Ambulatory Visit (HOSPITAL_COMMUNITY)
Admission: RE | Admit: 2017-06-24 | Discharge: 2017-06-24 | Disposition: A | Discharge status: Home / Self Care | Payer: Medicare Other | Source: Ambulatory Visit | Attending: Internal Medicine | Admitting: Internal Medicine

## 2017-06-24 DIAGNOSIS — I2584 Coronary atherosclerosis due to calcified coronary lesion: Secondary | ICD-10-CM | POA: Insufficient documentation

## 2017-06-24 DIAGNOSIS — R079 Chest pain, unspecified: Secondary | ICD-10-CM

## 2017-06-24 DIAGNOSIS — I2510 Atherosclerotic heart disease of native coronary artery without angina pectoris: Secondary | ICD-10-CM

## 2017-06-24 MED ORDER — NITROGLYCERIN 0.4 MG SL SUBL
SUBLINGUAL_TABLET | SUBLINGUAL | Status: AC
  Filled 2017-06-24: qty 1

## 2017-06-24 MED ORDER — NITROGLYCERIN 0.4 MG SL SUBL
0.4000 mg | SUBLINGUAL_TABLET | Freq: Once | SUBLINGUAL | Status: AC
  Administered 2017-06-24: 0.4 mg via SUBLINGUAL

## 2017-06-24 MED ORDER — IOPAMIDOL (ISOVUE-370) INJECTION 76%
INTRAVENOUS | Status: AC
  Administered 2017-06-24: 80 mL via INTRAVENOUS
  Filled 2017-06-24: qty 100

## 2017-06-24 NOTE — Progress Notes (Signed)
CT scan completed. Tolerated well. D/C home with husband. Awake and alert. In no distress 

## 2017-06-25 ENCOUNTER — Other Ambulatory Visit: Payer: Self-pay | Admitting: Internal Medicine

## 2017-06-25 ENCOUNTER — Ambulatory Visit (HOSPITAL_COMMUNITY): Admission: RE | Admit: 2017-06-25 | Payer: Medicare Other | Source: Ambulatory Visit

## 2017-06-25 ENCOUNTER — Ambulatory Visit (HOSPITAL_COMMUNITY)
Admission: RE | Admit: 2017-06-25 | Discharge: 2017-06-25 | Disposition: A | Discharge status: Home / Self Care | Payer: Medicare Other | Source: Ambulatory Visit | Attending: Internal Medicine | Admitting: Internal Medicine

## 2017-06-25 ENCOUNTER — Other Ambulatory Visit (HOSPITAL_COMMUNITY): Payer: Self-pay | Admitting: *Deleted

## 2017-06-25 DIAGNOSIS — I2510 Atherosclerotic heart disease of native coronary artery without angina pectoris: Secondary | ICD-10-CM | POA: Insufficient documentation

## 2017-06-25 DIAGNOSIS — I2584 Coronary atherosclerosis due to calcified coronary lesion: Secondary | ICD-10-CM | POA: Insufficient documentation

## 2017-07-04 ENCOUNTER — Other Ambulatory Visit (HOSPITAL_COMMUNITY): Payer: Self-pay | Admitting: *Deleted

## 2017-07-04 ENCOUNTER — Encounter (HOSPITAL_COMMUNITY): Payer: Self-pay | Admitting: *Deleted

## 2017-07-04 DIAGNOSIS — R0600 Dyspnea, unspecified: Secondary | ICD-10-CM

## 2017-07-04 DIAGNOSIS — R0609 Other forms of dyspnea: Secondary | ICD-10-CM

## 2017-07-08 ENCOUNTER — Other Ambulatory Visit: Payer: Medicare Other

## 2017-07-11 ENCOUNTER — Other Ambulatory Visit: Payer: Self-pay

## 2017-07-11 ENCOUNTER — Encounter (HOSPITAL_COMMUNITY)
Admission: RE | Disposition: A | Discharge status: Home / Self Care | Payer: Self-pay | Source: Ambulatory Visit | Attending: Cardiovascular Disease

## 2017-07-11 ENCOUNTER — Observation Stay (HOSPITAL_COMMUNITY)
Admission: RE | Admit: 2017-07-11 | Discharge: 2017-07-12 | Disposition: A | Discharge status: Home / Self Care | Payer: Medicare Other | Source: Ambulatory Visit | Attending: Cardiovascular Disease | Admitting: Cardiovascular Disease

## 2017-07-11 DIAGNOSIS — Z7982 Long term (current) use of aspirin: Secondary | ICD-10-CM | POA: Diagnosis not present

## 2017-07-11 DIAGNOSIS — Z87891 Personal history of nicotine dependence: Secondary | ICD-10-CM | POA: Insufficient documentation

## 2017-07-11 DIAGNOSIS — Z8543 Personal history of malignant neoplasm of ovary: Secondary | ICD-10-CM | POA: Diagnosis not present

## 2017-07-11 DIAGNOSIS — I25118 Atherosclerotic heart disease of native coronary artery with other forms of angina pectoris: Secondary | ICD-10-CM | POA: Diagnosis not present

## 2017-07-11 DIAGNOSIS — J479 Bronchiectasis, uncomplicated: Secondary | ICD-10-CM | POA: Diagnosis not present

## 2017-07-11 DIAGNOSIS — I2510 Atherosclerotic heart disease of native coronary artery without angina pectoris: Secondary | ICD-10-CM | POA: Diagnosis present

## 2017-07-11 DIAGNOSIS — Z888 Allergy status to other drugs, medicaments and biological substances status: Secondary | ICD-10-CM | POA: Insufficient documentation

## 2017-07-11 DIAGNOSIS — R0600 Dyspnea, unspecified: Secondary | ICD-10-CM

## 2017-07-11 DIAGNOSIS — R0609 Other forms of dyspnea: Secondary | ICD-10-CM

## 2017-07-11 HISTORY — PX: CARDIAC CATHETERIZATION: SHX172

## 2017-07-11 HISTORY — DX: Dyspnea, unspecified: R06.00

## 2017-07-11 HISTORY — PX: RIGHT/LEFT HEART CATH AND CORONARY ANGIOGRAPHY: CATH118266

## 2017-07-11 HISTORY — DX: Atherosclerotic heart disease of native coronary artery without angina pectoris: I25.10

## 2017-07-11 HISTORY — PX: INTRAVASCULAR PRESSURE WIRE/FFR STUDY: CATH118243

## 2017-07-11 LAB — POCT I-STAT 3, ART BLOOD GAS (G3+)
Bicarbonate: 25.4 mmol/L (ref 20.0–28.0)
O2 Saturation: 98 %
TCO2: 27 mmol/L (ref 22–32)
pCO2 arterial: 44.5 mmHg (ref 32.0–48.0)
pH, Arterial: 7.365 (ref 7.350–7.450)
pO2, Arterial: 109 mmHg — ABNORMAL HIGH (ref 83.0–108.0)

## 2017-07-11 LAB — BASIC METABOLIC PANEL
Anion gap: 7 (ref 5–15)
BUN: 18 mg/dL (ref 6–20)
CO2: 29 mmol/L (ref 22–32)
Calcium: 9.5 mg/dL (ref 8.9–10.3)
Chloride: 102 mmol/L (ref 101–111)
Creatinine, Ser: 0.99 mg/dL (ref 0.44–1.00)
GFR calc Af Amer: >60 mL/min (ref >60)
GFR calc non Af Amer: 55 mL/min — ABNORMAL LOW (ref >60)
Glucose, Bld: 95 mg/dL (ref 65–99)
Potassium: 4 mmol/L (ref 3.5–5.1)
Sodium: 138 mmol/L (ref 135–145)

## 2017-07-11 LAB — POCT I-STAT 3, VENOUS BLOOD GAS (G3P V)
Acid-base deficit: 1 mmol/L (ref 0.0–2.0)
Bicarbonate: 25.5 mmol/L (ref 20.0–28.0)
Bicarbonate: 25.9 mmol/L (ref 20.0–28.0)
O2 Saturation: 77 %
O2 Saturation: 80 %
TCO2: 27 mmol/L (ref 22–32)
TCO2: 27 mmol/L (ref 22–32)
pCO2, Ven: 46.8 mmHg (ref 44.0–60.0)
pCO2, Ven: 47.9 mmHg (ref 44.0–60.0)
pH, Ven: 7.341 (ref 7.250–7.430)
pH, Ven: 7.344 (ref 7.250–7.430)
pO2, Ven: 45 mmHg (ref 32.0–45.0)
pO2, Ven: 47 mmHg — ABNORMAL HIGH (ref 32.0–45.0)

## 2017-07-11 LAB — PROTIME-INR
INR: 0.95
Prothrombin Time: 12.6 seconds (ref 11.4–15.2)

## 2017-07-11 LAB — CBC
HCT: 38.5 % (ref 36.0–46.0)
Hemoglobin: 12.7 g/dL (ref 12.0–15.0)
MCH: 28 pg (ref 26.0–34.0)
MCHC: 33 g/dL (ref 30.0–36.0)
MCV: 84.8 fL (ref 78.0–100.0)
Platelets: 243 10*3/uL (ref 150–400)
RBC: 4.54 MIL/uL (ref 3.87–5.11)
RDW: 14.6 % (ref 11.5–15.5)
WBC: 7.1 10*3/uL (ref 4.0–10.5)

## 2017-07-11 LAB — POCT ACTIVATED CLOTTING TIME
Activated Clotting Time: 202 seconds
Activated Clotting Time: 241 seconds

## 2017-07-11 SURGERY — RIGHT/LEFT HEART CATH AND CORONARY ANGIOGRAPHY
Anesthesia: LOCAL

## 2017-07-11 MED ORDER — ACETAMINOPHEN 325 MG PO TABS
ORAL_TABLET | ORAL | Status: AC
  Administered 2017-07-11: 650 mg via ORAL
  Filled 2017-07-11: qty 2

## 2017-07-11 MED ORDER — ISOSORBIDE MONONITRATE ER 30 MG PO TB24: 15.0000 mg | ORAL_TABLET | Freq: Every day | ORAL | 11 refills | Status: DC

## 2017-07-11 MED ORDER — SODIUM CHLORIDE 0.9% FLUSH: 3.0000 mL | INTRAVENOUS | Status: DC | PRN

## 2017-07-11 MED ORDER — HEPARIN SODIUM (PORCINE) 1000 UNIT/ML IJ SOLN
INTRAMUSCULAR | Status: DC | PRN
  Administered 2017-07-11: 4000 [IU] via INTRAVENOUS
  Administered 2017-07-11: 3000 [IU] via INTRAVENOUS

## 2017-07-11 MED ORDER — ASPIRIN 81 MG PO CHEW: 81.0000 mg | CHEWABLE_TABLET | ORAL | Status: DC

## 2017-07-11 MED ORDER — ASPIRIN EC 81 MG PO TBEC
81.0000 mg | DELAYED_RELEASE_TABLET | Freq: Every day | ORAL | Status: DC
  Administered 2017-07-12: 11:00:00 81 mg via ORAL
  Filled 2017-07-11: qty 1

## 2017-07-11 MED ORDER — SODIUM CHLORIDE 0.9% FLUSH: 3.0000 mL | Freq: Two times a day (BID) | INTRAVENOUS | Status: DC

## 2017-07-11 MED ORDER — POTASSIUM & MAGNESIUM ASPARTAT 250-250 MG PO CAPS: 1.0000 | ORAL_CAPSULE | Freq: Every day | ORAL | Status: DC

## 2017-07-11 MED ORDER — FENTANYL CITRATE (PF) 100 MCG/2ML IJ SOLN
INTRAMUSCULAR | Status: AC
  Filled 2017-07-11: qty 2

## 2017-07-11 MED ORDER — NITROGLYCERIN 1 MG/10 ML FOR IR/CATH LAB
INTRA_ARTERIAL | Status: AC
  Filled 2017-07-11: qty 10

## 2017-07-11 MED ORDER — HEPARIN SODIUM (PORCINE) 1000 UNIT/ML IJ SOLN
INTRAMUSCULAR | Status: AC
  Filled 2017-07-11: qty 1

## 2017-07-11 MED ORDER — SODIUM CHLORIDE 0.9 % IV SOLN
INTRAVENOUS | Status: AC | PRN
  Administered 2017-07-11: 250 mL via INTRAVENOUS

## 2017-07-11 MED ORDER — MIDAZOLAM HCL 2 MG/2ML IJ SOLN
INTRAMUSCULAR | Status: DC | PRN
  Administered 2017-07-11 (×2): 1 mg via INTRAVENOUS

## 2017-07-11 MED ORDER — MIDAZOLAM HCL 2 MG/2ML IJ SOLN
INTRAMUSCULAR | Status: AC
  Filled 2017-07-11: qty 2

## 2017-07-11 MED ORDER — SODIUM CHLORIDE 0.9 % WEIGHT BASED INFUSION: 1.0000 mL/kg/h | INTRAVENOUS | Status: AC

## 2017-07-11 MED ORDER — IOPAMIDOL (ISOVUE-370) INJECTION 76%
INTRAVENOUS | Status: AC
  Filled 2017-07-11: qty 100

## 2017-07-11 MED ORDER — SODIUM CHLORIDE 0.9 % IV SOLN
INTRAVENOUS | Status: DC
  Administered 2017-07-11: 12:00:00 via INTRAVENOUS

## 2017-07-11 MED ORDER — IOPAMIDOL (ISOVUE-370) INJECTION 76%
INTRAVENOUS | Status: DC | PRN
  Administered 2017-07-11: 110 mL via INTRA_ARTERIAL

## 2017-07-11 MED ORDER — LIDOCAINE HCL (PF) 1 % IJ SOLN
INTRAMUSCULAR | Status: DC | PRN
  Administered 2017-07-11 (×2): 2 mL via INTRADERMAL
  Administered 2017-07-11: 8 mL via INTRADERMAL

## 2017-07-11 MED ORDER — CLOPIDOGREL BISULFATE 300 MG PO TABS
ORAL_TABLET | ORAL | Status: AC
Start: 2017-07-11 — End: 2017-07-11
  Filled 2017-07-11: qty 2

## 2017-07-11 MED ORDER — LIDOCAINE HCL (PF) 1 % IJ SOLN
INTRAMUSCULAR | Status: AC
  Filled 2017-07-11: qty 30

## 2017-07-11 MED ORDER — HEPARIN (PORCINE) IN NACL 2-0.9 UNIT/ML-% IJ SOLN
INTRAMUSCULAR | Status: AC | PRN
  Administered 2017-07-11: 1000 mL via INTRA_ARTERIAL

## 2017-07-11 MED ORDER — ACETAMINOPHEN 325 MG PO TABS
650.0000 mg | ORAL_TABLET | Freq: Once | ORAL | Status: AC
  Administered 2017-07-11: 650 mg via ORAL

## 2017-07-11 MED ORDER — CLOPIDOGREL BISULFATE 300 MG PO TABS
ORAL_TABLET | ORAL | Status: DC | PRN
  Administered 2017-07-11: 600 mg via ORAL

## 2017-07-11 MED ORDER — NITROGLYCERIN 1 MG/10 ML FOR IR/CATH LAB
INTRA_ARTERIAL | Status: DC | PRN
  Administered 2017-07-11: 150 ug via INTRACORONARY

## 2017-07-11 MED ORDER — SODIUM CHLORIDE 0.9 % IV SOLN: 250.0000 mL | INTRAVENOUS | Status: DC | PRN

## 2017-07-11 MED ORDER — FENTANYL CITRATE (PF) 100 MCG/2ML IJ SOLN
INTRAMUSCULAR | Status: DC | PRN
  Administered 2017-07-11: 25 ug via INTRAVENOUS

## 2017-07-11 MED ORDER — ADENOSINE 12 MG/4ML IV SOLN
INTRAVENOUS | Status: AC
  Filled 2017-07-11: qty 16

## 2017-07-11 MED ORDER — HEPARIN (PORCINE) IN NACL 2-0.9 UNIT/ML-% IJ SOLN
INTRAMUSCULAR | Status: AC
  Filled 2017-07-11: qty 1000

## 2017-07-11 MED ORDER — VERAPAMIL HCL 2.5 MG/ML IV SOLN
INTRAVENOUS | Status: DC | PRN
  Administered 2017-07-11: 14:00:00 via INTRA_ARTERIAL

## 2017-07-11 MED ORDER — ROSUVASTATIN CALCIUM 20 MG PO TABS: 20.0000 mg | ORAL_TABLET | Freq: Every day | ORAL | 11 refills | Status: DC

## 2017-07-11 MED ORDER — VERAPAMIL HCL 2.5 MG/ML IV SOLN
INTRAVENOUS | Status: AC
  Filled 2017-07-11: qty 2

## 2017-07-11 MED ORDER — ADENOSINE (DIAGNOSTIC) 140MCG/KG/MIN
INTRAVENOUS | Status: DC | PRN
  Administered 2017-07-11: 140 ug/kg/min via INTRAVENOUS

## 2017-07-11 SURGICAL SUPPLY — 24 items
CATH 5FR JL3.5 JR4 ANG PIG MP (CATHETERS) ×1 IMPLANT
CATH BALLN WEDGE 5F 110CM (CATHETERS) ×1 IMPLANT
CATH MICROCATH NAVVUS (MICROCATHETER) IMPLANT
CATH VISTA GUIDE 6FR XBLAD3.0 (CATHETERS) ×1 IMPLANT
DEVICE CLOSURE PERCLS PRGLD 6F (VASCULAR PRODUCTS) IMPLANT
DEVICE RAD COMP TR BAND LRG (VASCULAR PRODUCTS) ×1 IMPLANT
GLIDESHEATH SLEND SS 6F .021 (SHEATH) ×1 IMPLANT
GUIDEWIRE .025 260CM (WIRE) ×1 IMPLANT
GUIDEWIRE INQWIRE 1.5J.035X260 (WIRE) IMPLANT
INQWIRE 1.5J .035X260CM (WIRE) ×2
KIT ENCORE 26 ADVANTAGE (KITS) ×1 IMPLANT
KIT ESSENTIALS PG (KITS) ×1 IMPLANT
KIT HEART LEFT (KITS) ×2 IMPLANT
MICROCATHETER NAVVUS (MICROCATHETER) ×2
PACK CARDIAC CATHETERIZATION (CUSTOM PROCEDURE TRAY) ×2 IMPLANT
PERCLOSE PROGLIDE 6F (VASCULAR PRODUCTS) ×2
SHEATH GLIDE SLENDER 4/5FR (SHEATH) ×1 IMPLANT
SHEATH PINNACLE 5F 10CM (SHEATH) ×1 IMPLANT
SHEATH PINNACLE 6F 10CM (SHEATH) ×1 IMPLANT
SYR MEDRAD MARK V 150ML (SYRINGE) ×2 IMPLANT
TRANSDUCER W/STOPCOCK (MISCELLANEOUS) ×2 IMPLANT
TUBING CIL FLEX 10 FLL-RA (TUBING) ×2 IMPLANT
WIRE COUGAR XT STRL 190CM (WIRE) ×2 IMPLANT
WIRE HI TORQ VERSACORE-J 145CM (WIRE) ×1 IMPLANT

## 2017-07-11 NOTE — H&P (Signed)
ADVANCED HF CLINIC CONSULT NOTE  Referring Physician: Primary Care: Primary Cardiologist:  HPI:  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  Minimal PMHx except for ovarian CA.   Last year developed PNA and was treated. Since that time has noted progressive exertional dyspnea despite which she has remained fairly active.Dyspnea only happens when she exercises. Typically does spin classes for 1hr 3x/week, plays tennis and does hot yoga. Now gets SOB going up steps. Chest also gets tight when she exercise.Recently cycling in [place] and note dyspnea at top. + chronic cough.  Has had extensive w/u:  In 10/17 had exercise myoview:  Nuclear stress EF: 73%.  ST segment depression was noted during stress in the III, II and aVF leads, and returning to baseline after 5-9 minutes of recovery.  This is a low risk study.  The left ventricular ejection fraction is hyperdynamic (>65%).  Normal images  CT scan: No PE. Mild scarring/bronchiectasis. + coronary calcifications  Echo 10/18: EF 60% grade II DD. Normal RV no PAH  CXR: mild hyperexpansion. No infiltrate  PFTs: Normal spirometry. Decreased DLCO Increased volumes  Very strong Fhx of CAD on father's side   Review of Systems: [y] = yes, [ ]  = no    General: Weight gain [ ] ; Weight loss [ ] ; Anorexia[ ] ; Fatigue [ ] ; Fever [ ] ; Chills [ ] ; Weakness [ ]   Cardiac: Chest pain/pressure [ ] ; Resting SOB[ ] ; Exertional SOB[y]; Orthopnea[ ] ; Pedal Edema[ ] ; Palpitations[ ] ; Syncope [ ] ; Presyncope[ ] ; Paroxysmal nocturnal dyspnea[ ]   Pulmonary:Cough[ ] ;Wheezing[ ] ;Hemoptysis[ ] ;Sputum [ ] ; Snoring [ ]   GI: Vomiting[ ] ;Dysphagia[ ] ;Melena[ ] ;Hematochezia [ ] ; Heartburn[ ] ; Abdominal pain [ ] ;Constipation [ ] ; Diarrhea [ ] ; BRBPR [ ]   GU: Hematuria[ ] ; Dysuria[ ] ; Nocturia[ ]     Vascular: Pain in legs with walking [ ] ; Pain in feet with lying flat [ ] ; Non-healing sores [ ] ; Stroke [ ] ; TIA [ ] ; Slurred speech [ ] ;  Neuro: Headaches[ ] ;Vertigo[ ] ; Seizures[ ] ; Paresthesias[ ] ;Blurred vision [ ] ;Diplopia[ ] ; Vision changes [ ]   Ortho/Skin: Arthritis [ ] ; Joint pain [ ] ;Muscle pain [ ] ;Joint swelling [ ] ;Back Pain [ ] ;Rash [ ]   Psych: Depression[ ] ;Anxiety[ ]   Heme: Bleeding problems [ ] ; Clotting disorders [ ] ; Anemia [ ]   Endocrine: Diabetes [ ] ;Thyroid dysfunction[ ]        Past Medical History:  Diagnosis Date  . Osteopenia 11/2016   T score -1.8 FRAX 16%/5.8% noting Boniva use 2000 12/23/2009  . Ovarian cancer ([place]) 1990   Stage 3  . Squamous cell skin cancer           Current Outpatient Medications  Medication Sig Dispense Refill  . aspirin 81 MG tablet Take 81 mg by mouth daily.    . Calcium Carbonate-Vitamin D (CALCIUM + D PO) Take 1 tablet by mouth daily.     Marland Kitchen estradiol (ESTRING) 2 MG vaginal ring Place 2 mg vaginally every 3 (three) months. follow package directions 1 each 4  . OVER THE COUNTER MEDICATION Potassium and magnesium     No current facility-administered medications for this encounter.         Allergies  Allergen Reactions  . Albuterol Palpitations  . Xopenex [Levalbuterol Hcl] Palpitations    Pt with extreme nervousness , shakes and weakness after breathing treatment.     Social History  Socioeconomic History  . Marital status: Married    Spouse name: Not on file  . Number of children: Not on file  . Years of education: Not on file  . Highest education level: Not on file  Social Needs  . Financial resource strain: Not on file  . Food insecurity - worry: Not on file  . Food insecurity - inability: Not on file  . Transportation needs - medical: Not on file  . Transportation needs - non-medical: Not on file  Occupational History  . Not on file   Tobacco Use  . Smoking status: Former Smoker    Packs/day: 0.20    Years: 8.00    Pack years: 1.60    Types: Cigarettes    Last attempt to quit: 07/24/1967    Years since quitting: 49.9  . Smokeless tobacco: Never Used  Substance and Sexual Activity  . Alcohol use: Yes    Alcohol/week: 1.8 oz    Types: 3 Standard drinks or equivalent per week  . Drug use: No  . Sexual activity: Not Currently    Birth control/protection: Surgical    Comment: 1st intercourse 71 yo-Fewer than 5 partners  Other Topics Concern  . Not on file  Social History Narrative  . Not on file          Family History  Problem Relation Age of Onset  . Breast cancer Mother    51's  . Ovarian cancer Mother   . Heart disease Father   . Breast cancer Maternal Grandmother    Age 15's       Vitals:   06/06/17 1049  BP: (!) 123/59  Pulse: 61  SpO2: 100%  Weight: 113 lb (51.3 kg)    PHYSICAL EXAM: General: Well appearing. No respiratory difficulty HEENT: normal Neck: supple. no JVD. Carotids 2+ bilat; no bruits. No lymphadenopathy or thryomegaly appreciated. Cor: PMI nondisplaced.Loletha Grayer regular. No rubs, gallops or murmurs. Lungs: clear Abdomen: soft, nontender, nondistended. No hepatosplenomegaly. No bruits or masses. Good bowel sounds. Extremities: no cyanosis, clubbing, rash, edema Neuro: alert & oriented x 3, cranial nerves grossly intact. moves all 4 extremities w/o difficulty. Affect pleasant.  NID:POEUM ST-T wave abnormalities.   ASSESSMENT & PLAN:  1. Exertional dyspnea and chest pressure: - her symptoms are classic for progressive angina. And this is supported by strong FHX of CAD and coronary calcium on recent CT chest.  - I have reviewed all her studies and cannot find any other explanation for her dyspnea - I also reviewed her nuclear stress test from last year. ECG was + but I also think there is subtle reversible defect in  anteroapical/septal area that is not breast attenuation - Discussed need for cath or cardiac CTA to clearly assess coronaries. She would like to proceed with CTA. We will schedule this asap. Continue ASA. - If no CAD with pursue RHC.  2. Mild bronchiectasis and air-trapping - Followed by Dr. Lake Bells.I do not think this explain her symptoms.  Glori Bickers, MD 2:01 PM  ADDENDUM 07/11/2017: Pt independently interviewed and examined. I have reviewed his note and agree with plans as outlined. I have reviewed risks/indications of cath/PCI with the patient. Discussed adding a right heart cath since her primary limitation is shortness of breath. She agrees. All questions answered. No significant clinical changes since the evaluation above.   Sherren Mocha 07/11/2017 1:26 PM  ADDENDUM: 07/11/2017:   Patient evaluated in short-stay following her procedure. Continued to have oozing along her brachial, radial, and  femoral sites. Pressure applied and pressure dressings in place. No hematoma noted along sites. Continued care provided by Astrid Drafts, RN and Suella Broad, RN. Notified Dr. Burt Knack who was in agreement for the patient to be monitored overnight. Repeat CBC in AM. Anticipate discharge tomorrow morning.   Signed, Erma Heritage, PA-C 07/11/2017, 8:03 PM Pager: [phone]

## 2017-07-11 NOTE — Discharge Instructions (Signed)
Moderate Conscious Sedation, Adult, Care After These instructions provide you with information about caring for yourself after your procedure. Your health care provider may also give you more specific instructions. Your treatment has been planned according to current medical practices, but problems sometimes occur. Call your health care provider if you have any problems or questions after your procedure. What can I expect after the procedure? After your procedure, it is common:  To feel sleepy for several hours.  To feel clumsy and have poor balance for several hours.  To have poor judgment for several hours.  To vomit if you eat too soon.  Follow these instructions at home: For at least 24 hours after the procedure:   Do not: ? Participate in activities where you could fall or become injured. ? Drive. ? Use heavy machinery. ? Drink alcohol. ? Take sleeping pills or medicines that cause drowsiness. ? Make important decisions or sign legal documents. ? Take care of children on your own.  Rest. Eating and drinking  Follow the diet recommended by your health care provider.  If you vomit: ? Drink water, juice, or soup when you can drink without vomiting. ? Make sure you have little or no nausea before eating solid foods. General instructions  Have a responsible adult stay with you until you are awake and alert.  Take over-the-counter and prescription medicines only as told by your health care provider.  If you smoke, do not smoke without supervision.  Keep all follow-up visits as told by your health care provider. This is important. Contact a health care provider if:  You keep feeling nauseous or you keep vomiting.  You feel light-headed.  You develop a rash.  You have a fever. Get help right away if:  You have trouble breathing. This information is not intended to replace advice given to you by your health care provider. Make sure you discuss any questions you have  with your health care provider. Document Released: 04/29/2013 Document Revised: 12/12/2015 Document Reviewed: 10/29/2015 Elsevier Interactive Patient Education  2018 [place] Refer to this sheet in the next few weeks. These instructions provide you with information about caring for yourself after your procedure. Your health care provider may also give you more specific instructions. Your treatment has been planned according to current medical practices, but problems sometimes occur. Call your health care provider if you have any problems or questions after your procedure. What can I expect after the procedure? After your procedure, it is typical to have the following:  Bruising at the radial site that usually fades within 1-2 weeks.  Blood collecting in the tissue (hematoma) that may be painful to the touch. It should usually decrease in size and tenderness within 1-2 weeks.  Follow these instructions at home:  Take medicines only as directed by your health care provider.  You may shower 24-48 hours after the procedure or as directed by your health care provider. Remove the bandage (dressing) and gently wash the site with plain soap and water. Pat the area dry with a clean towel. Do not rub the site, because this may cause bleeding.  Do not take baths, swim, or use a hot tub until your health care provider approves.  Check your insertion site every day for redness, swelling, or drainage.  Do not apply powder or lotion to the site.  Do not flex or bend the affected arm for 24 hours or as directed by your health care provider.  Do not  push or pull heavy objects with the affected arm for 24 hours or as directed by your health care provider.  Do not lift over 10 lb (4.5 kg) for 5 days after your procedure or as directed by your health care provider.  Ask your health care provider when it is okay to: ? Return to work or school. ? Resume usual physical activities or  sports. ? Resume sexual activity.  Do not drive home if you are discharged the same day as the procedure. Have someone else drive you.  You may drive 24 hours after the procedure unless otherwise instructed by your health care provider.  Do not operate machinery or power tools for 24 hours after the procedure.  If your procedure was done as an outpatient procedure, which means that you went home the same day as your procedure, a responsible adult should be with you for the first 24 hours after you arrive home.  Keep all follow-up visits as directed by your health care provider. This is important. Contact a health care provider if:  You have a fever.  You have chills.  You have increased bleeding from the radial site. Hold pressure on the site. Get help right away if:  You have unusual pain at the radial site.  You have redness, warmth, or swelling at the radial site.  You have drainage (other than a small amount of blood on the dressing) from the radial site.  The radial site is bleeding, and the bleeding does not stop after 30 minutes of holding steady pressure on the site.  Your arm or hand becomes pale, cool, tingly, or numb. This information is not intended to replace advice given to you by your health care provider. Make sure you discuss any questions you have with your health care provider. Document Released: 08/11/2010 Document Revised: 12/15/2015 Document Reviewed: 01/25/2014 Elsevier Interactive Patient Education  2018 [place] After This sheet gives you information about how to care for yourself after your procedure. Your health care provider may also give you more specific instructions. If you have problems or questions, contact your health care provider. What can I expect after the procedure? After the procedure, it is common to have bruising and tenderness at the catheter insertion area. Follow these instructions at home: Insertion site  care  Follow instructions from your health care provider about how to take care of your insertion site. Make sure you: ? Wash your hands with soap and water before you change your bandage (dressing). If soap and water are not available, use hand sanitizer. ? Change your dressing as told by your health care provider. ? Leave stitches (sutures), skin glue, or adhesive strips in place. These skin closures may need to stay in place for 2 weeks or longer. If adhesive strip edges start to loosen and curl up, you may trim the loose edges. Do not remove adhesive strips completely unless your health care provider tells you to do that.  Do not take baths, swim, or use a hot tub until your health care provider approves.  You may shower 24-48 hours after the procedure or as told by your health care provider. ? Gently wash the site with plain soap and water. ? Pat the area dry with a clean towel. ? Do not rub the site. This may cause bleeding.  Do not apply powder or lotion to the site. Keep the site clean and dry.  Check your insertion site every day for signs of infection.  Check for: ? Redness, swelling, or pain. ? Fluid or blood. ? Warmth. ? Pus or a bad smell. Activity  Rest as told by your health care provider, usually for 1-2 days.  Do not lift anything that is heavier than 10 lbs. (4.5 kg) or as told by your health care provider.  Do not drive for 24 hours if you were given a medicine to help you relax (sedative).  Do not drive or use heavy machinery while taking prescription pain medicine. General instructions  Return to your normal activities as told by your health care provider, usually in about a week. Ask your health care provider what activities are safe for you.  If the catheter site starts bleeding, lie flat and put pressure on the site. If the bleeding does not stop, get help right away. This is a medical emergency.  Drink enough fluid to keep your urine clear or pale yellow.  This helps flush the contrast dye from your body.  Take over-the-counter and prescription medicines only as told by your health care provider.  Keep all follow-up visits as told by your health care provider. This is important. Contact a health care provider if:  You have a fever or chills.  You have redness, swelling, or pain around your insertion site.  You have fluid or blood coming from your insertion site.  The insertion site feels warm to the touch.  You have pus or a bad smell coming from your insertion site.  You have bruising around the insertion site.  You notice blood collecting in the tissue around the catheter site (hematoma). The hematoma may be painful to the touch. Get help right away if:  You have severe pain at the catheter insertion area.  The catheter insertion area swells very fast.  The catheter insertion area is bleeding, and the bleeding does not stop when you hold steady pressure on the area.  The area near or just beyond the catheter insertion site becomes pale, cool, tingly, or numb. These symptoms may represent a serious problem that is an emergency. Do not wait to see if the symptoms will go away. Get medical help right away. Call your local emergency services (911 in the [place] Do not drive yourself to the hospital. Summary  After the procedure, it is common to have bruising and tenderness at the catheter insertion area.  After the procedure, it is important to rest and drink plenty of fluids.  Do not take baths, swim, or use a hot tub until your health care provider says it is okay to do so. You may shower 24-48 hours after the procedure or as told by your health care provider.  If the catheter site starts bleeding, lie flat and put pressure on the site. If the bleeding does not stop, get help right away. This is a medical emergency. This information is not intended to replace advice given to you by your health care provider. Make sure you discuss  any questions you have with your health care provider. Document Released: 01/25/2005 Document Revised: 06/13/2016 Document Reviewed: 06/13/2016 Elsevier Interactive Patient Education  [employer].

## 2017-07-11 NOTE — H&P (Signed)
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  Minimal PMHx except for ovarian CA.   Last year developed PNA and was treated. Since that time has noted progressive exertional dyspnea despite which she has remained fairly active. Dyspnea only happens when she exercises. Typically does spin classes for 1hr 3x/week, plays tennis and does hot yoga. Now gets SOB going up steps. Chest also gets tight when she exercise. Recently [place] in [place] and note dyspnea at top. + chronic cough.  Has had extensive w/u:  In 10/17 had exercise myoview:  Nuclear stress EF: 73%.  ST segment depression was noted during stress in the III, II and aVF leads, and returning to baseline after 5-9 minutes of recovery.  This is a low risk study.  The left ventricular ejection fraction is hyperdynamic (>65%).  Normal images  CT scan: No PE. Mild scarring/bronchiectasis. + coronary calcifications  Echo 10/18: EF 60% grade II DD. Normal RV no PAH  CXR: mild hyperexpansion. No infiltrate  PFTs: Normal spirometry. Decreased DLCO Increased volumes  Very strong Fhx of CAD on father's side   Review of Systems: [y] = yes, [ ]  = no    General: Weight gain [ ] ; Weight loss [ ] ; Anorexia [ ] ; Fatigue [ ] ; Fever [ ] ; Chills [ ] ; Weakness [ ]    Cardiac: Chest pain/pressure [ ] ; Resting SOB [ ] ; Exertional SOB Blue.Reese ]; Orthopnea [ ] ; Pedal Edema [ ] ; Palpitations [ ] ; Syncope [ ] ; Presyncope [ ] ; Paroxysmal nocturnal dyspnea[ ]    Pulmonary: Cough [ ] ; Wheezing[ ] ; Hemoptysis[ ] ; Sputum [ ] ; Snoring [ ]    GI: Vomiting[ ] ; Dysphagia[ ] ; Melena[ ] ; Hematochezia [ ] ; Heartburn[ ] ; Abdominal pain [ ] ; Constipation [ ] ; Diarrhea [ ] ; BRBPR [ ]    GU: Hematuria[ ] ; Dysuria [ ] ; Nocturia[ ]     Vascular: Pain in legs with walking [ ] ; Pain in feet with lying flat [ ] ; Non-healing sores [ ] ; Stroke [ ] ; TIA [ ] ; Slurred speech [ ] ;   Neuro: Headaches[ ] ; Vertigo[ ] ; Seizures[ ] ; Paresthesias[ ] ;Blurred vision [ ] ; Diplopia [ ] ; Vision changes [ ]    Ortho/Skin: Arthritis [ ] ; Joint pain [ ] ; Muscle pain [ ] ; Joint swelling [ ] ; Back Pain [ ] ; Rash [ ]    Psych: Depression[ ] ; Anxiety[ ]    Heme: Bleeding problems [ ] ; Clotting disorders [ ] ; Anemia [ ]    Endocrine: Diabetes [ ] ; Thyroid dysfunction[ ]        Past Medical History:  Diagnosis Date  . Osteopenia 11/2016   T score -1.8 FRAX 16%/5.8% noting Boniva use 2000 12/23/2009  . Ovarian cancer ([place]) 1990   Stage 3  . Squamous cell skin cancer           Current Outpatient Medications  Medication Sig Dispense Refill  . aspirin 81 MG tablet Take 81 mg by mouth daily.    . Calcium Carbonate-Vitamin D (CALCIUM + D PO) Take 1 tablet by mouth daily.     Marland Kitchen estradiol (ESTRING) 2 MG vaginal ring Place 2 mg vaginally every 3 (three) months. follow package directions 1 each 4  . OVER THE COUNTER MEDICATION Potassium and magnesium     No current facility-administered medications for this encounter.  Allergies  Allergen Reactions  . Albuterol Palpitations  . Xopenex [Levalbuterol Hcl] Palpitations    Pt with extreme nervousness , shakes and weakness after breathing treatment.       Social History        Socioeconomic History  . Marital status: Married    Spouse name: Not on file  . Number of children: Not on file  . Years of education: Not on file  . Highest education level: Not on file  Social Needs  . Financial resource strain: Not on file  . Food insecurity - worry: Not on file  . Food insecurity - inability: Not on file  . Transportation needs - medical: Not on file  . Transportation needs - non-medical: Not on file  Occupational History  . Not on file  Tobacco Use  .  Smoking status: Former Smoker    Packs/day: 0.20    Years: 8.00    Pack years: 1.60    Types: Cigarettes    Last attempt to quit: 07/24/1967    Years since quitting: 49.9  . Smokeless tobacco: Never Used  Substance and Sexual Activity  . Alcohol use: Yes    Alcohol/week: 1.8 oz    Types: 3 Standard drinks or equivalent per week  . Drug use: No  . Sexual activity: Not Currently    Birth control/protection: Surgical    Comment: 1st intercourse 71 yo-Fewer than 5 partners  Other Topics Concern  . Not on file  Social History Narrative  . Not on file           Family History  Problem Relation Age of Onset  . Breast cancer Mother        44's  . Ovarian cancer Mother   . Heart disease Father   . Breast cancer Maternal Grandmother        Age 47's       Vitals:   06/06/17 1049  BP: (!) 123/59  Pulse: 61  SpO2: 100%  Weight: 113 lb (51.3 kg)    PHYSICAL EXAM: General:  Well appearing. No respiratory difficulty HEENT: normal Neck: supple. no JVD. Carotids 2+ bilat; no bruits. No lymphadenopathy or thryomegaly appreciated. Cor: PMI nondisplaced. Loletha Grayer regular. No rubs, gallops or murmurs. Lungs: clear Abdomen: soft, nontender, nondistended. No hepatosplenomegaly. No bruits or masses. Good bowel sounds. Extremities: no cyanosis, clubbing, rash, edema Neuro: alert & oriented x 3, cranial nerves grossly intact. moves all 4 extremities w/o difficulty. Affect pleasant.  ECG: NSR No ST-T wave abnormalities.   ASSESSMENT & PLAN:  1. Exertional dyspnea and chest pressure:  - her symptoms are classic for progressive angina. And this is supported by strong FHX of CAD and coronary calcium on recent CT chest.  - I have reviewed all her studies and cannot find any other explanation for her dyspnea - I also reviewed her nuclear stress test from last year. ECG was + but I also think there is subtle reversible defect in anteroapical/septal area that  is not breast attenuation - Discussed need for cath or cardiac CTA to clearly assess coronaries. She would like to proceed with CTA. We will schedule this asap. Continue ASA. - If no CAD with pursue RHC.  2. Mild bronchiectasis and air-trapping - Followed by Dr. Lake Bells.I do not think this explain her symptoms.  Glori Bickers, MD  2:01 PM  ADDENDUM 07/11/2017: Pt independently interviewed and examined. I have reviewed his note and agree with plans as outlined. I have reviewed risks/indications of cath/PCI  with the patient. Discussed adding a right heart cath since her primary limitation is shortness of breath. She agrees. All questions answered. No significant clinical changes since the evaluation above.   Sherren Mocha 07/11/2017 1:26 PM

## 2017-07-11 NOTE — Progress Notes (Addendum)
Mild bruising right wrist above and slightly below tr band,level 1,  3 cc remain in band, Suella Broad Rn from cath lab assessed, slight pressure dressing was placed above and below tr band, 2+ radial pulse, hand is warm and pink, pt denies tingling in right hand. Will continue to monitor.  1900 pressure dressing was removed, site still slightly puffy but no worse than when dressing was placed, monitored for 10 minutes, puffiness seemed a bit increased,  [place] 2 PA for cardiology was paged to assess. 3cc of air was added for total of 6cc pressure dressings re applied. 1915 left ac brachial dressing bloody, right groin checked and dressing saturated. Dressings removed, pressure held, on groin, PA [place] arrived to assist, all sites were oozing, no hematomas. All dressing sites redressed with pressure dressings, pt to be admitted.

## 2017-07-12 ENCOUNTER — Encounter (HOSPITAL_COMMUNITY): Payer: Self-pay | Admitting: Cardiovascular Disease

## 2017-07-12 ENCOUNTER — Other Ambulatory Visit: Payer: Self-pay

## 2017-07-12 DIAGNOSIS — Z8543 Personal history of malignant neoplasm of ovary: Secondary | ICD-10-CM | POA: Diagnosis not present

## 2017-07-12 DIAGNOSIS — I25118 Atherosclerotic heart disease of native coronary artery with other forms of angina pectoris: Secondary | ICD-10-CM | POA: Diagnosis not present

## 2017-07-12 DIAGNOSIS — Z888 Allergy status to other drugs, medicaments and biological substances status: Secondary | ICD-10-CM | POA: Diagnosis not present

## 2017-07-12 DIAGNOSIS — R0609 Other forms of dyspnea: Secondary | ICD-10-CM

## 2017-07-12 DIAGNOSIS — I25119 Atherosclerotic heart disease of native coronary artery with unspecified angina pectoris: Secondary | ICD-10-CM

## 2017-07-12 DIAGNOSIS — J479 Bronchiectasis, uncomplicated: Secondary | ICD-10-CM | POA: Diagnosis not present

## 2017-07-12 DIAGNOSIS — Z87891 Personal history of nicotine dependence: Secondary | ICD-10-CM | POA: Diagnosis not present

## 2017-07-12 DIAGNOSIS — Z7982 Long term (current) use of aspirin: Secondary | ICD-10-CM | POA: Diagnosis not present

## 2017-07-12 LAB — CBC
HCT: 33.9 % — ABNORMAL LOW (ref 36.0–46.0)
Hemoglobin: 11 g/dL — ABNORMAL LOW (ref 12.0–15.0)
MCH: 27.8 pg (ref 26.0–34.0)
MCHC: 32.4 g/dL (ref 30.0–36.0)
MCV: 85.6 fL (ref 78.0–100.0)
Platelets: 206 10*3/uL (ref 150–400)
RBC: 3.96 MIL/uL (ref 3.87–5.11)
RDW: 14.7 % (ref 11.5–15.5)
WBC: 5.8 10*3/uL (ref 4.0–10.5)

## 2017-07-12 LAB — BASIC METABOLIC PANEL
Anion gap: 8 (ref 5–15)
BUN: 16 mg/dL (ref 6–20)
CO2: 24 mmol/L (ref 22–32)
Calcium: 9.1 mg/dL (ref 8.9–10.3)
Chloride: 105 mmol/L (ref 101–111)
Creatinine, Ser: 0.82 mg/dL (ref 0.44–1.00)
GFR calc Af Amer: >60 mL/min (ref >60)
GFR calc non Af Amer: >60 mL/min (ref >60)
Glucose, Bld: 91 mg/dL (ref 65–99)
Potassium: 4 mmol/L (ref 3.5–5.1)
Sodium: 137 mmol/L (ref 135–145)

## 2017-07-12 NOTE — Care Management Obs Status (Signed)
[facility] NOTIFICATION   Patient Details  Name: Barbara Schwartz MRN: [id number] Date of Birth: 1945-10-06   Medicare Observation Status Notification Given:  Yes    Zenon Mayo, RN 07/12/2017, 9:50 AM

## 2017-07-12 NOTE — Discharge Summary (Signed)
Discharge Summary    Patient ID: Barbara Schwartz,  MRN: [id number], DOB/AGE: [birth year] 71 y.o.  Admit date: 07/11/2017 Discharge date: 07/12/2017  Primary Care Provider: Lanice Schwartz Primary Cardiologist: Barbara Schwartz  Discharge Diagnoses    Active Problems:   Coronary artery disease with exertional angina Barbara Schwartz)   CAD (coronary artery disease)   Allergies Allergies  Allergen Reactions  . Albuterol Palpitations  . Xopenex [Levalbuterol Hcl] Palpitations    Pt with extreme nervousness , shakes and weakness after breathing treatment.     Diagnostic Studies/Procedures    Cath: 07/11/17  Conclusion   1.  Moderate proximal LAD/first diagonal stenosis, both lesions interrogated with FFR (0.92 LAD and 0.87 Diagonal). 2.  Widely patent left main, left circumflex, and right coronary arteries with minimal nonobstructive plaquing 3.  Normal right heart hemodynamics 4.  Normal LV systolic function with normal LVEDP  Recommendations: Medical therapy for risk reduction. Rx called in for isosorbide 15 mg daily and rosuvastatin 20 mg daily. Pt currently on ASA 81 mg. FU Dr Haroldine Schwartz.   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 Minimal PMHx except for ovarian CA.   Last year developed PNA and was treated. Since that time has noted progressive exertional dyspnea despite which she has remained fairly active.Dyspnea only happens when she exercises. Typically does spin classes for 1hr 3x/week, plays tennis and does hot yoga. Now gets SOB going up steps. Chest also gets tight when she exercises.Recently cycling in [place] and note dyspnea at top. + chronic cough. Had extensive pulmonary work up, which was all normal. Did note a strong family history of CAD and had coronary calcium on CT chest. Was scheduled for a coronary CTA which showed moderate pLAD  stenosis and D1 disease. FFR was noted at 0.78 at the end of the LAD, and she was referred for outpatient cardiac cath with L/R heart.   [facility]wartz Course      Underwent cardiac cath with Barbara Schwartz noted above with moderate pLAD and first diagonal disease with FFR (0.92 LAD, and 0.87 Diag). Normal right heart pressures, along with normal LV function and LVEDP. Plan to start on Imdur 15mg  daily, along with statin. She was planned for discharge post cath, but developed bleeding at radial and groin cath sites. Therefore, held overnight for observation. Morning labs were stable.   General: Well developed, well nourished, female appearing in no acute distress. Head: Normocephalic, atraumatic.  Neck: Supple without bruits, JVD. Lungs:  Resp regular and unlabored, CTA. Heart: RRR, S1, S2, no S3, S4, or murmur; no rub. Abdomen: Soft, non-tender, non-distended with normoactive bowel sounds. No hepatomegaly. No rebound/guarding. No obvious abdominal masses. Extremities: No clubbing, cyanosis, edema. Distal pedal pulses are 2+ bilaterally. R radial/groin cath site stable without hematoma. Neuro: Alert and oriented X 3. Moves all extremities spontaneously. Psych: Normal affect.  Barbara Schwartz was seen by Barbara Schwartz and determined stable for discharge home. Follow up in the office has been arranged. Medications are listed below.   _____________  Discharge Vitals Blood pressure (!) 156/60, pulse (!) 58, temperature 98.1 F (36.7 C), temperature source Oral, resp. rate 14, height 5\' 5"  (1.651 m), weight 113 lb 15.7 oz (51.7 kg), SpO2 98 %.  Filed Weights   07/11/17 1147 07/11/17 2057 07/12/17 0415  Weight: 114 lb (51.7 kg) 113 lb 15.7 oz (51.7  kg) 113 lb 15.7 oz (51.7 kg)    Labs & Radiologic Studies    CBC Recent Labs    07/11/17 1140 07/12/17 0358  WBC 7.1 5.8  HGB 12.7 11.0*  HCT 38.5 33.9*  MCV 84.8 85.6  PLT 243 867   Basic Metabolic Panel Recent Labs    07/11/17 1140  07/12/17 0358  NA 138 137  K 4.0 4.0  CL 102 105  CO2 29 24  GLUCOSE 95 91  BUN 18 16  CREATININE 0.99 0.82  CALCIUM 9.5 9.1   Liver Function Tests No results for input(s): AST, ALT, ALKPHOS, BILITOT, PROT, ALBUMIN in the last 72 hours. No results for input(s): LIPASE, AMYLASE in the last 72 hours. Cardiac Enzymes No results for input(s): CKTOTAL, CKMB, CKMBINDEX, TROPONINI in the last 72 hours. BNP Invalid input(s): POCBNP D-Dimer No results for input(s): DDIMER in the last 72 hours. Hemoglobin A1C No results for input(s): HGBA1C in the last 72 hours. Fasting Lipid Panel No results for input(s): CHOL, HDL, LDLCALC, TRIG, CHOLHDL, LDLDIRECT in the last 72 hours. Thyroid Function Tests No results for input(s): TSH, T4TOTAL, T3FREE, THYROIDAB in the last 72 hours.  Invalid input(s): FREET3 _____________  Ct Coronary Morph W/cta Cor W/score W/ca W/cm &/or Wo/cm  Addendum Date: 06/24/2017   ADDENDUM REPORT: 06/24/2017 16:54 CLINICAL DATA:  Chest pain EXAM: Cardiac CTA MEDICATIONS: Sub lingual nitro. 4mg  TECHNIQUE: The patient was scanned on a Siemens 619 slice scanner. Gantry rotation speed was 240 msecs. Collimation was 0.6 mm. A 100 kV prospective scan was triggered in the ascending thoracic aorta at 35-75% of the R-R interval. Average HR during the scan was 60 bpm. The 3D data set was interpreted on a dedicated work station using MPR, MIP and VRT modes. A total of 80cc of contrast was used. FINDINGS: Non-cardiac: See separate report from [facility]. Calcium Score:  41 Agatston units. Coronary Arteries: Right dominant with no anomalies LM:  No plaque or stenosis. LAD system: Moderate D1 without significant disease. Mixed plaque with moderate stenosis in the proximal LAD distal to D1. It is possible that this looks worse than it is due to shadowing artifact from the calcification. No disease in the remainder of the LAD which wraps around the apex to provide circulation to a  portion of the inferior wall. Circumflex system:  No plaque or stenosis. RCA system:  No plaque or stenosis. IMPRESSION: 1. Coronary artery calcium score of 41 Agatston units, placing the patient in the 51st percentile for age and gender. This suggests intermediate risk for future cardiac events. 2.  Possible moderate proximal LAD stenosis just distal to D1. I will send for CT FFR confirmation. Dalton Mclean Electronically Signed   By: Loralie Champagne M.D.   On: 06/24/2017 16:54   Result Date: 06/24/2017 EXAM: OVER-READ INTERPRETATION  CT CHEST The following report is an over-read performed by radiologist Dr. Forest Gleason [facility] on 06/24/2017. This over-read does not include interpretation of cardiac or coronary anatomy or pathology. The coronary CTA interpretation by the cardiologist is attached. COMPARISON:  05/16/2017 chest radiograph.  04/27/2015 chest CT. FINDINGS: Vascular: Normal aortic caliber. No central pulmonary embolism, on this non-dedicated study. Mediastinum/Nodes: No imaged thoracic adenopathy. Lungs/Pleura: No pleural fluid. Improved right middle lobe and lingular aeration with mild volume loss and bronchiectasis remaining. Upper Abdomen: Normal imaged portions of the liver, spleen, stomach, pancreas, left adrenal gland, left kidney. Musculoskeletal: A mild pectus excavatum deformity. Mild thoracic spondylosis. IMPRESSION: 1.  No  acute findings in the imaged extracardiac chest. 2. Improved right middle lobe and lingular aeration with sequelae of chronic atypical infection again identified. Electronically Signed: By: Abigail Miyamoto M.D. On: 06/24/2017 11:12   Ct Coronary Fractional Flow Reserve Data Prep  Result Date: 06/26/2017 CLINICAL DATA:  Chest pain EXAM: CT FFR MEDICATIONS: None FINDINGS: FFR 0.88 distal to proximal LAD stenosis, not hemodynamically significant. The FFR is 0.78 at the end of the LAD, suggesting that the totality of disease in the LAD may be hemodynamically  significant in the distal LAD, but this does not appear to be created by a single discrete stenosis. Dalton Mclean Electronically Signed   By: Loralie Champagne M.D.   On: 06/26/2017 01:19   Disposition   Pt is being discharged home today in good condition.  Follow-up Plans & Appointments    Follow-up Information    Bensimhon, Shaune Pascal, MD Follow up on 08/01/2017.   Specialty:  Cardiology Why:  at 10:30am for your follow up appt. Parking code is [id number]. Contact information: [place] [address] [phone]          Discharge Instructions    Call MD for:  redness, tenderness, or signs of infection (pain, swelling, redness, odor or green/yellow discharge around incision site)   Complete by:  As directed    Diet - low sodium heart healthy   Complete by:  As directed    Discharge instructions   Complete by:  As directed    Groin Site Care Refer to this sheet in the next few weeks. These instructions provide you with information on caring for yourself after your procedure. Your caregiver may also give you more specific instructions. Your treatment has been planned according to current medical practices, but problems sometimes occur. Call your caregiver if you have any problems or questions after your procedure. HOME CARE INSTRUCTIONS You may shower 24 hours after the procedure. Remove the bandage (dressing) and gently wash the site with plain soap and water. Gently pat the site dry.  Do not apply powder or lotion to the site.  Do not sit in a bathtub, swimming pool, or whirlpool for 5 to 7 days.  No bending, squatting, or lifting anything over 10 pounds (4.5 kg) as directed by your caregiver.  Inspect the site at least twice daily.  Do not drive home if you are discharged the same day of the procedure. Have someone else drive you.  You may drive 24 hours after the procedure unless otherwise instructed by your caregiver.  What to expect: Any bruising  will usually fade within 1 to 2 weeks.  Blood that collects in the tissue (hematoma) may be painful to the touch. It should usually decrease in size and tenderness within 1 to 2 weeks.  SEEK IMMEDIATE MEDICAL CARE IF: You have unusual pain at the groin site or down the affected leg.  You have redness, warmth, swelling, or pain at the groin site.  You have drainage (other than a small amount of blood on the dressing).  You have chills.  You have a fever or persistent symptoms for more than 72 hours.  You have a fever and your symptoms suddenly get worse.  Your leg becomes pale, cool, tingly, or numb.  You have heavy bleeding from the site. Hold pressure on the site. .  Radial Site Care Refer to this sheet in the next few weeks. These instructions provide you with information on caring for yourself after  your procedure. Your caregiver may also give you more specific instructions. Your treatment has been planned according to current medical practices, but problems sometimes occur. Call your caregiver if you have any problems or questions after your procedure. HOME CARE INSTRUCTIONS You may shower the day after the procedure.Remove the bandage (dressing) and gently wash the site with plain soap and water.Gently pat the site dry.  Do not apply powder or lotion to the site.  Do not submerge the affected site in water for 3 to 5 days.  Inspect the site at least twice daily.  Do not flex or bend the affected arm for 24 hours.  No lifting over 5 pounds (2.3 kg) for 5 days after your procedure.  Do not drive home if you are discharged the same day of the procedure. Have someone else drive you.  You may drive 24 hours after the procedure unless otherwise instructed by your caregiver.  What to expect: Any bruising will usually fade within 1 to 2 weeks.  Blood that collects in the tissue (hematoma) may be painful to the touch. It should usually decrease in size and tenderness within 1 to 2 weeks.    SEEK IMMEDIATE MEDICAL CARE IF: You have unusual pain at the radial site.  You have redness, warmth, swelling, or pain at the radial site.  You have drainage (other than a small amount of blood on the dressing).  You have chills.  You have a fever or persistent symptoms for more than 72 hours.  You have a fever and your symptoms suddenly get worse.  Your arm becomes pale, cool, tingly, or numb.  You have heavy bleeding from the site. Hold pressure on the site.   Increase activity slowly   Complete by:  As directed       Discharge Medications     Medication List    TAKE these medications   aspirin EC 81 MG tablet Take 81 mg by mouth daily.   CALCIUM 600+D3 PO Take 1 tablet by mouth 2 (two) times daily.   estradiol 2 MG vaginal ring Commonly known as:  ESTRING Place 2 mg vaginally every 3 (three) months. follow package directions   ibuprofen 200 MG tablet Commonly known as:  ADVIL,MOTRIN Take 200-600 mg by mouth every 8 (eight) hours as needed (for pain).   Investigational - Study Medication Take 3 capsules by mouth daily. Study name:Cosmos Study Trial-[employer]coa Supplement & Multivitamin   Additional study details: [facility] in [place]   isosorbide mononitrate 30 MG 24 hr tablet Commonly known as:  IMDUR Take 0.5 tablets (15 mg total) by mouth daily.   Potassium & Magnesium Aspartat 250-250 MG Caps Take 1 tablet by mouth daily.   rosuvastatin 20 MG tablet Commonly known as:  CRESTOR Take 1 tablet (20 mg total) by mouth at bedtime.         Outstanding Labs/Studies   FLP/LFTs in 6 weeks if tolerating statin.   Duration of Discharge Encounter   Greater than 30 minutes including physician time.  Signed, Reino Bellis NP-C 07/12/2017, 11:01 AM   Patient seen and examined and history reviewed. Agree with above findings and plan. S/p cath yesterday. Results favorable. Some oozing from cath site now improved. Lungs clear. No gallop.  She is stable for DC today. Follow up in office.   Markan Cazarez Schwartz, [place] 07/12/2017 11:22 AM

## 2017-07-12 NOTE — Progress Notes (Signed)
TR BAND REMOVAL  LOCATION:    right radial  DEFLATED PER PROTOCOL:    Yes.    TIME BAND OFF / DRESSING APPLIED:    23:15   SITE UPON ARRIVAL:    Level 1 ( Hematoma; Bruise)  SITE AFTER BAND REMOVAL:    Level 1(Bruise)  CIRCULATION SENSATION AND MOVEMENT:    Within Normal Limits   Yes.    COMMENTS:   Post TR band instructions given. Pt tolerated well.

## 2017-08-01 ENCOUNTER — Other Ambulatory Visit: Payer: Self-pay

## 2017-08-01 ENCOUNTER — Ambulatory Visit (HOSPITAL_COMMUNITY)
Admit: 2017-08-01 | Discharge: 2017-08-01 | Disposition: A | Discharge status: Home / Self Care | Payer: Medicare Other | Attending: Internal Medicine | Admitting: Internal Medicine

## 2017-08-01 ENCOUNTER — Encounter (HOSPITAL_COMMUNITY): Payer: Self-pay | Admitting: Internal Medicine

## 2017-08-01 VITALS — BP 126/62 | HR 57 | Wt 113.8 lb

## 2017-08-01 DIAGNOSIS — R0600 Dyspnea, unspecified: Secondary | ICD-10-CM

## 2017-08-01 DIAGNOSIS — I2510 Atherosclerotic heart disease of native coronary artery without angina pectoris: Secondary | ICD-10-CM | POA: Insufficient documentation

## 2017-08-01 DIAGNOSIS — M8580 Other specified disorders of bone density and structure, unspecified site: Secondary | ICD-10-CM | POA: Insufficient documentation

## 2017-08-01 DIAGNOSIS — Z87891 Personal history of nicotine dependence: Secondary | ICD-10-CM | POA: Insufficient documentation

## 2017-08-01 DIAGNOSIS — Z7982 Long term (current) use of aspirin: Secondary | ICD-10-CM | POA: Insufficient documentation

## 2017-08-01 DIAGNOSIS — R0609 Other forms of dyspnea: Secondary | ICD-10-CM | POA: Diagnosis not present

## 2017-08-01 DIAGNOSIS — Z8543 Personal history of malignant neoplasm of ovary: Secondary | ICD-10-CM | POA: Diagnosis not present

## 2017-08-01 DIAGNOSIS — J479 Bronchiectasis, uncomplicated: Secondary | ICD-10-CM | POA: Insufficient documentation

## 2017-08-01 DIAGNOSIS — R0789 Other chest pain: Secondary | ICD-10-CM | POA: Insufficient documentation

## 2017-08-01 DIAGNOSIS — Z79899 Other long term (current) drug therapy: Secondary | ICD-10-CM | POA: Diagnosis not present

## 2017-08-01 NOTE — Patient Instructions (Signed)
Follow up with Dr.Bensimhon in 2-3 months.  

## 2017-08-01 NOTE — Progress Notes (Signed)
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 Lake Bells due to progressive exertional dyspnea.  Recently underwent CTA coronaries and found to have borderline lesion in proximal LAD. Underwent subsequent right and left cath and flow wire interrogation by Dr. Burt Knack on 07/11/2017. Hemodynamics normal. Lesions in LAD and diagonal non-obstructive by FFR.  Here for post-cath f/u. Started on imdur 15 and crestor. Has mild HAs but starting to feel better. Cough has resolved. Is doing spiining classes, tennis and hot yoga. Still with occasional chest pressure with exercise.     Cath 12/18   1.  Moderate proximal LAD/first diagonal stenosis (60%), both lesions interrogated with FFR (0.92 LAD and 0.87 Diagonal). 2.  Widely patent left main, left circumflex, and right coronary arteries with minimal nonobstructive plaquing 3.  Normal right heart hemodynamics 4.  Normal LV systolic function with normal LVEDP  RA 2 RV 28/4 PA 23/5 (12)  PCWP 14 Fick 5.7/3.7  Recommendations: Medical therapy for risk reduction. Rx called in for isosorbide 15 mg daily and rosuvastatin 20 mg daily. Pt currently on ASA 81 mg. FU Dr Haroldine Laws.   In 10/17 had exercise myoview:  Nuclear stress EF: 73%.  ST segment depression was noted during stress in the III, II and aVF leads, and returning to baseline after 5-9 minutes of recovery.  This is a low risk study.  The left ventricular ejection fraction is hyperdynamic (>65%).  Normal images  CT scan: No PE. Mild scarring/bronchiectasis. + coronary calcifications  Echo 10/18: EF 60% grade II DD. Normal RV no PAH  CXR: mild hyperexpansion. No infiltrate  PFTs: Normal spirometry. Decreased DLCO Increased volumes  Very strong Fhx of CAD on father's side    Past Medical History:  Diagnosis Date  . Coronary artery disease     . Dyspnea    with exercise  . Osteopenia 11/2016   T score -1.8 FRAX 16%/5.8% noting Boniva use 2000 12/23/2009  . Ovarian cancer ([place]) 1990   Stage 3  . Squamous cell skin cancer     Current Outpatient Medications  Medication Sig Dispense Refill  . aspirin EC 81 MG tablet Take 81 mg by mouth daily.    . Calcium Carb-Cholecalciferol (CALCIUM 600+D3 PO) Take 1 tablet by mouth 2 (two) times daily.    Marland Kitchen estradiol (ESTRING) 2 MG vaginal ring Place 2 mg vaginally every 3 (three) months. follow package directions 1 each 4  . ibuprofen (ADVIL,MOTRIN) 200 MG tablet Take 200-600 mg by mouth every 8 (eight) hours as needed (for pain).    . Investigational - Study Medication Take 3 capsules by mouth daily. Study name:Cosmos Study Trial-COcoa Supplement & Multivitamin   Additional study details: [facility] in [place]    . isosorbide mononitrate (IMDUR) 30 MG 24 hr tablet Take 0.5 tablets (15 mg total) by mouth daily. 30 tablet 11  . Mag Aspart-Potassium Aspart (POTASSIUM & MAGNESIUM ASPARTAT) 250-250 MG CAPS Take 1 tablet by mouth daily.    . rosuvastatin (CRESTOR) 20 MG tablet Take 1 tablet (20 mg total) by mouth at bedtime. 30 tablet 11   No current facility-administered medications for this encounter.     Allergies  Allergen Reactions  . Albuterol Palpitations  . Xopenex [Levalbuterol Hcl] Palpitations    Pt with extreme nervousness , shakes and weakness after breathing treatment.  Social History   Socioeconomic History  . Marital status: Married    Spouse name: Not on file  . Number of children: Not on file  . Years of education: Not on file  . Highest education level: Not on file  Social Needs  . Financial resource strain: Not on file  . Food insecurity - worry: Not on file  . Food insecurity - inability: Not on file  . Transportation needs - medical: Not on file  . Transportation needs - non-medical: Not on file  Occupational History  .  Not on file  Tobacco Use  . Smoking status: Former Smoker    Packs/day: 0.20    Years: 8.00    Pack years: 1.60    Types: Cigarettes    Last attempt to quit: 07/24/1967    Years since quitting: 50.0  . Smokeless tobacco: Never Used  Substance and Sexual Activity  . Alcohol use: Yes    Alcohol/week: 1.8 oz    Types: 3 Standard drinks or equivalent per week  . Drug use: No  . Sexual activity: Not Currently    Birth control/protection: Surgical    Comment: 1st intercourse 72 yo-Fewer than 5 partners  Other Topics Concern  . Not on file  Social History Narrative  . Not on file      Family History  Problem Relation Age of Onset  . Breast cancer Mother        42's  . Ovarian cancer Mother   . Heart disease Father   . Breast cancer Maternal Grandmother        Age 24's    Vitals:   08/01/17 1023  BP: 126/62  Pulse: (!) 57  SpO2: 100%  Weight: 113 lb 12 oz (51.6 kg)    PHYSICAL EXAM: General:  Well appearing. No resp difficulty HEENT: normal Neck: supple. no JVD. Carotids 2+ bilat; no bruits. No lymphadenopathy or thryomegaly appreciated. Cor: PMI nondisplaced. Regular rate & rhythm. No rubs, gallops or murmurs. Lungs: clear Abdomen: soft, nontender, nondistended. No hepatosplenomegaly. No bruits or masses. Good bowel sounds. Extremities: no cyanosis, clubbing, rash, edema Neuro: alert & orientedx3, cranial nerves grossly intact. moves all 4 extremities w/o difficulty. Affect pleasant   ECG: NSR No ST-T wave abnormalities.   ASSESSMENT & PLAN:  1. Exertional dyspnea and chest pressure:  - cath with non-obstructive CAD and normal RH pressures.  - I reviewed cath fils and discussed options with her and her husband personally     Options:        1. Proceed with CPX now        2. Continue her own exercise program and if symptoms persist CPX in 3 months        3. Refer cardiac rehab  - She prefers #2. We will keep close tabs on her and can proceed with CPX sooner if  needed.  2. Mild bronchiectasis and air-trapping - Followed by Dr. Lake Bells.I do not think this is severe enough to  explain her symptoms.  3. CAD - nonobstructive - continue ASA & statin.  Glori Bickers, MD  10:49 AM

## 2017-09-10 ENCOUNTER — Other Ambulatory Visit: Payer: Self-pay | Admitting: Dermatology

## 2017-09-10 DIAGNOSIS — D485 Neoplasm of uncertain behavior of skin: Secondary | ICD-10-CM | POA: Diagnosis not present

## 2017-09-10 DIAGNOSIS — D0472 Carcinoma in situ of skin of left lower limb, including hip: Secondary | ICD-10-CM | POA: Diagnosis not present

## 2017-09-10 DIAGNOSIS — L570 Actinic keratosis: Secondary | ICD-10-CM | POA: Diagnosis not present

## 2017-10-15 ENCOUNTER — Encounter (HOSPITAL_COMMUNITY): Payer: Self-pay | Admitting: Internal Medicine

## 2017-10-15 ENCOUNTER — Other Ambulatory Visit: Payer: Self-pay

## 2017-10-15 ENCOUNTER — Ambulatory Visit (HOSPITAL_COMMUNITY)
Admission: RE | Admit: 2017-10-15 | Discharge: 2017-10-15 | Disposition: A | Discharge status: Home / Self Care | Payer: Medicare Other | Source: Ambulatory Visit | Attending: Internal Medicine | Admitting: Internal Medicine

## 2017-10-15 VITALS — BP 113/63 | HR 51 | Wt 113.5 lb

## 2017-10-15 DIAGNOSIS — R0609 Other forms of dyspnea: Secondary | ICD-10-CM

## 2017-10-15 DIAGNOSIS — Z85828 Personal history of other malignant neoplasm of skin: Secondary | ICD-10-CM | POA: Diagnosis not present

## 2017-10-15 DIAGNOSIS — J479 Bronchiectasis, uncomplicated: Secondary | ICD-10-CM | POA: Insufficient documentation

## 2017-10-15 DIAGNOSIS — R918 Other nonspecific abnormal finding of lung field: Secondary | ICD-10-CM | POA: Diagnosis not present

## 2017-10-15 DIAGNOSIS — Z8543 Personal history of malignant neoplasm of ovary: Secondary | ICD-10-CM | POA: Diagnosis not present

## 2017-10-15 DIAGNOSIS — M8580 Other specified disorders of bone density and structure, unspecified site: Secondary | ICD-10-CM | POA: Insufficient documentation

## 2017-10-15 DIAGNOSIS — Z7982 Long term (current) use of aspirin: Secondary | ICD-10-CM | POA: Insufficient documentation

## 2017-10-15 DIAGNOSIS — Z79899 Other long term (current) drug therapy: Secondary | ICD-10-CM | POA: Diagnosis not present

## 2017-10-15 DIAGNOSIS — R0789 Other chest pain: Secondary | ICD-10-CM | POA: Diagnosis not present

## 2017-10-15 DIAGNOSIS — I2510 Atherosclerotic heart disease of native coronary artery without angina pectoris: Secondary | ICD-10-CM

## 2017-10-15 DIAGNOSIS — Z87891 Personal history of nicotine dependence: Secondary | ICD-10-CM | POA: Diagnosis not present

## 2017-10-15 DIAGNOSIS — R0600 Dyspnea, unspecified: Secondary | ICD-10-CM

## 2017-10-15 NOTE — Progress Notes (Signed)
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 Lake Bells due to progressive exertional dyspnea.  Recently underwent CTA coronaries and found to have borderline lesion in proximal LAD. Underwent subsequent right and left cath and flow wire interrogation by Dr. Burt Knack on 07/11/2017. Hemodynamics normal. Lesions in LAD and diagonal non-obstructive by FFR.  Here for routine f/u. Exertional dyspnea improved. Cough mostly resolved - though her husband says she still coughs. Remains very active. Trying to push herself more. Doing spin classes and playing tennis. No more problems with climbing stairs or lifting objects. No CP.     Cath 12/18   1.  Moderate proximal LAD/first diagonal stenosis (60%), both lesions interrogated with FFR (0.92 LAD and 0.87 Diagonal). 2.  Widely patent left main, left circumflex, and right coronary arteries with minimal nonobstructive plaquing 3.  Normal right heart hemodynamics 4.  Normal LV systolic function with normal LVEDP  RA 2 RV 28/4 PA 23/5 (12)  PCWP 14 Fick 5.7/3.7  Recommendations: Medical therapy for risk reduction. Rx called in for isosorbide 15 mg daily and rosuvastatin 20 mg daily. Pt currently on ASA 81 mg. FU Dr Haroldine Laws.   In 10/17 had exercise myoview:  Nuclear stress EF: 73%.  ST segment depression was noted during stress in the III, II and aVF leads, and returning to baseline after 5-9 minutes of recovery.  This is a low risk study.  The left ventricular ejection fraction is hyperdynamic (>65%).  Normal images  CT scan: No PE. Mild scarring/bronchiectasis. + coronary calcifications  Echo 10/18: EF 60% grade II DD. Normal RV no PAH  CXR: mild hyperexpansion. No infiltrate  PFTs: Normal spirometry. Decreased DLCO Increased volumes  Very strong Fhx of CAD on father's side    Past Medical  History:  Diagnosis Date  . Coronary artery disease   . Dyspnea    with exercise  . Osteopenia 11/2016   T score -1.8 FRAX 16%/5.8% noting Boniva use 2000 12/23/2009  . Ovarian cancer ([place]) 1990   Stage 3  . Squamous cell skin cancer     Current Outpatient Medications  Medication Sig Dispense Refill  . aspirin EC 81 MG tablet Take 81 mg by mouth daily.    . Calcium Carb-Cholecalciferol (CALCIUM 600+D3 PO) Take 1 tablet by mouth 2 (two) times daily.    Marland Kitchen estradiol (ESTRING) 2 MG vaginal ring Place 2 mg vaginally every 3 (three) months. follow package directions 1 each 4  . ibuprofen (ADVIL,MOTRIN) 200 MG tablet Take 200-600 mg by mouth every 8 (eight) hours as needed (for pain).    . Investigational - Study Medication Take 3 capsules by mouth daily. Study name:Cosmos Study Trial-COcoa Supplement & Multivitamin   Additional study details: [facility] in [place]    . isosorbide mononitrate (IMDUR) 30 MG 24 hr tablet Take 0.5 tablets (15 mg total) by mouth daily. 30 tablet 11  . Mag Aspart-Potassium Aspart (POTASSIUM & MAGNESIUM ASPARTAT) 250-250 MG CAPS Take 1 tablet by mouth daily.    . rosuvastatin (CRESTOR) 20 MG tablet Take 1 tablet (20 mg total) by mouth at bedtime. 30 tablet 11   No current facility-administered medications for this encounter.     Allergies  Allergen Reactions  . Albuterol Palpitations  . Xopenex [Levalbuterol Hcl] Palpitations    Pt with extreme nervousness , shakes and  weakness after breathing treatment.       Social History   Socioeconomic History  . Marital status: Married    Spouse name: Not on file  . Number of children: Not on file  . Years of education: Not on file  . Highest education level: Not on file  Occupational History  . Not on file  Social Needs  . Financial resource strain: Not on file  . Food insecurity:    Worry: Not on file    Inability: Not on file  . Transportation needs:    Medical: Not on file     Non-medical: Not on file  Tobacco Use  . Smoking status: Former Smoker    Packs/day: 0.20    Years: 8.00    Pack years: 1.60    Types: Cigarettes    Last attempt to quit: 07/24/1967    Years since quitting: 50.2  . Smokeless tobacco: Never Used  Substance and Sexual Activity  . Alcohol use: Yes    Alcohol/week: 1.8 oz    Types: 3 Standard drinks or equivalent per week  . Drug use: No  . Sexual activity: Not Currently    Birth control/protection: Surgical    Comment: 1st intercourse 72 yo-Fewer than 5 partners  Lifestyle  . Physical activity:    Days per week: Not on file    Minutes per session: Not on file  . Stress: Not on file  Relationships  . Social connections:    Talks on phone: Not on file    Gets together: Not on file    Attends religious service: Not on file    Active member of club or organization: Not on file    Attends meetings of clubs or organizations: Not on file    Relationship status: Not on file  . Intimate partner violence:    Fear of current or ex partner: Not on file    Emotionally abused: Not on file    Physically abused: Not on file    Forced sexual activity: Not on file  Other Topics Concern  . Not on file  Social History Narrative  . Not on file      Family History  Problem Relation Age of Onset  . Breast cancer Mother        86's  . Ovarian cancer Mother   . Heart disease Father   . Breast cancer Maternal Grandmother        Age 50's    Vitals:   10/15/17 1400  BP: 113/63  Pulse: (!) 51  SpO2: 100%  Weight: 113 lb 8 oz (51.5 kg)    PHYSICAL EXAM: General:  Well appearing. No resp difficulty HEENT: normal Neck: supple. no JVD. Carotids 2+ bilat; no bruits. No lymphadenopathy or thryomegaly appreciated. Cor: PMI nondisplaced. Regular rate & rhythm. No rubs, gallops or murmurs. Lungs: clear but mildly decreased throughout  Abdomen: soft, nontender, nondistended. No hepatosplenomegaly. No bruits or masses. Good bowel  sounds. Extremities: no cyanosis, clubbing, rash, edema Neuro: alert & orientedx3, cranial nerves grossly intact. moves all 4 extremities w/o difficulty. Affect pleasant   ASSESSMENT & PLAN:  1. Exertional dyspnea and chest pressure:  - cath 12/18 with non-obstructive CAD and normal RH pressures.  -continues to do well without signs of angina.  - we again discussed the possibility of CPX testing to objectively assess her functional capacity and compare to her peers. She would like to defer for now - we also discussed possibility of referral to [facility]  to evaluate for vocal cord dysfunction. Again, she would like to defer   2. Mild bronchiectasis and air-trapping - Followed by Dr. Lake Bells.I do not think this is severe enough to  explain her symptoms. - PFTs reviewed with mild diffusion defect  3. CAD - nonobstructive. No angina currently - continue ASA & statin.  Glori Bickers, MD  2:10 PM

## 2017-10-15 NOTE — Patient Instructions (Signed)
We will contact you in 6 months to schedule your next appointment.  

## 2017-11-13 ENCOUNTER — Other Ambulatory Visit: Payer: Self-pay | Admitting: Gynecology

## 2017-11-13 DIAGNOSIS — Z1231 Encounter for screening mammogram for malignant neoplasm of breast: Secondary | ICD-10-CM

## 2017-12-02 ENCOUNTER — Encounter: Payer: Self-pay | Admitting: Gynecology

## 2017-12-02 ENCOUNTER — Ambulatory Visit (INDEPENDENT_AMBULATORY_CARE_PROVIDER_SITE_OTHER): Payer: Medicare Other | Admitting: Gynecology

## 2017-12-02 VITALS — BP 118/70 | Ht 65.0 in | Wt 111.0 lb

## 2017-12-02 DIAGNOSIS — Z8543 Personal history of malignant neoplasm of ovary: Secondary | ICD-10-CM

## 2017-12-02 DIAGNOSIS — Z01419 Encounter for gynecological examination (general) (routine) without abnormal findings: Secondary | ICD-10-CM

## 2017-12-02 DIAGNOSIS — M8580 Other specified disorders of bone density and structure, unspecified site: Secondary | ICD-10-CM

## 2017-12-02 DIAGNOSIS — N952 Postmenopausal atrophic vaginitis: Secondary | ICD-10-CM

## 2017-12-02 DIAGNOSIS — Z9189 Other specified personal risk factors, not elsewhere classified: Secondary | ICD-10-CM | POA: Diagnosis not present

## 2017-12-02 MED ORDER — ESTRADIOL 2 MG VA RING: 2.0000 mg | VAGINAL_RING | VAGINAL | 4 refills | Status: DC

## 2017-12-02 NOTE — Patient Instructions (Signed)
Follow-up in 1 year for annual exam, sooner if any issues. 

## 2017-12-02 NOTE — Progress Notes (Signed)
    Barbara Schwartz 1946/02/08 [id number]        72 y.o.  G2P1011 for breast and pelvic exam  Past medical history,surgical history, problem list, medications, allergies, family history and social history were all reviewed and documented as reviewed in the EPIC chart.  ROS:  Performed with pertinent positives and negatives included in the history, assessment and plan.   Additional significant findings : None   Exam: Caryn Bee assistant Vitals:   12/02/17 1358  BP: 118/70  Weight: 111 lb (50.3 kg)  Height: 5\' 5"  (1.651 m)   Body mass index is 18.47 kg/m.  General appearance:  Normal affect, orientation and appearance. Skin: Grossly normal HEENT: Without gross lesions.  No cervical or supraclavicular adenopathy. Thyroid normal.  Lungs:  Clear without wheezing, rales or rhonchi Cardiac: RR, without RMG Abdominal:  Soft, nontender, without masses, guarding, rebound, organomegaly or hernia Breasts:  Examined lying and sitting without masses, retractions, discharge or axillary adenopathy. Pelvic:  Ext, BUS, Vagina: With atrophic changes  Adnexa: Without masses or tenderness    Anus and perineum: Normal   Rectovaginal: Normal sphincter tone without palpated masses or tenderness.    Assessment/Plan:  72 y.o. G64P1011 female for breast and pelvic exam.   1. Postmenopausal/atrophic genital changes.  Uses Estring 0.05 for vaginal atrophy as well as decreasing recurrent UTIs.  Wants to continue.  Refill x1 year provided. 2. History of stage III ovarian carcinoma status post TAH/BSO 1990.  Exam NED.  Check Ca1 25 today. 3. Mammography 12/2016.  Continue with annual mammography this coming June.  Breast exam normal today. 4. Osteopenia.  DEXA 2018 T score -1.8.  History of prior Boniva use.  Plan repeat DEXA next year at 2-year interval.  Reports having vitamin D level follow through her primary physician's office.  Calcium vitamin D supplementation as well as staying active. 5. Pap  smear 2017.  No Pap smear done today.  No history of abnormal Pap smears previously.  Options to stop screening per current screening guidelines based on age and hysterectomy history reviewed.  Will readdress on an annual basis. 6. Colonoscopy 2015.  Repeat at their recommended interval. 7. Health maintenance.  No routine lab work done as patient does this elsewhere.  Follow-up 1 year, sooner as needed.   [name], 2:28 PM 12/02/2017

## 2017-12-04 ENCOUNTER — Encounter: Payer: Self-pay | Admitting: Gynecology

## 2017-12-04 LAB — CA 125: CA 125: 9 U/mL (ref <35)

## 2017-12-11 DIAGNOSIS — Z0000 Encounter for general adult medical examination without abnormal findings: Secondary | ICD-10-CM | POA: Diagnosis not present

## 2017-12-11 DIAGNOSIS — R911 Solitary pulmonary nodule: Secondary | ICD-10-CM | POA: Diagnosis not present

## 2017-12-11 DIAGNOSIS — Z793 Long term (current) use of hormonal contraceptives: Secondary | ICD-10-CM | POA: Diagnosis not present

## 2017-12-11 DIAGNOSIS — D649 Anemia, unspecified: Secondary | ICD-10-CM | POA: Diagnosis not present

## 2017-12-11 DIAGNOSIS — Z1389 Encounter for screening for other disorder: Secondary | ICD-10-CM | POA: Diagnosis not present

## 2017-12-11 DIAGNOSIS — E785 Hyperlipidemia, unspecified: Secondary | ICD-10-CM | POA: Diagnosis not present

## 2017-12-11 DIAGNOSIS — E559 Vitamin D deficiency, unspecified: Secondary | ICD-10-CM | POA: Diagnosis not present

## 2017-12-11 DIAGNOSIS — I2510 Atherosclerotic heart disease of native coronary artery without angina pectoris: Secondary | ICD-10-CM | POA: Diagnosis not present

## 2017-12-31 ENCOUNTER — Ambulatory Visit
Admission: RE | Admit: 2017-12-31 | Discharge: 2017-12-31 | Disposition: A | Discharge status: Home / Self Care | Payer: Medicare Other | Source: Ambulatory Visit | Attending: Gynecology | Admitting: Gynecology

## 2017-12-31 DIAGNOSIS — Z1231 Encounter for screening mammogram for malignant neoplasm of breast: Secondary | ICD-10-CM

## 2018-01-10 DIAGNOSIS — D500 Iron deficiency anemia secondary to blood loss (chronic): Secondary | ICD-10-CM | POA: Diagnosis not present

## 2018-01-20 ENCOUNTER — Telehealth: Payer: Self-pay | Admitting: *Deleted

## 2018-01-20 DIAGNOSIS — Z803 Family history of malignant neoplasm of breast: Secondary | ICD-10-CM

## 2018-01-20 DIAGNOSIS — H2513 Age-related nuclear cataract, bilateral: Secondary | ICD-10-CM | POA: Diagnosis not present

## 2018-01-20 DIAGNOSIS — Z8543 Personal history of malignant neoplasm of ovary: Secondary | ICD-10-CM

## 2018-01-20 NOTE — Telephone Encounter (Signed)
Okay for order?

## 2018-01-20 NOTE — Telephone Encounter (Signed)
Order placed, husband given to husband to relay to patient to schedule with [facility] imaging.

## 2018-01-20 NOTE — Telephone Encounter (Signed)
patient called requesting yearly bilateral breast MRI order placed at [facility] due to strong family history and history of ovarian cancer. Had mammogram in June.  Okay to place order?

## 2018-01-20 NOTE — Telephone Encounter (Signed)
patient scheduled on 01/31/18 @ 4:00pm

## 2018-01-24 ENCOUNTER — Other Ambulatory Visit: Payer: Medicare Other

## 2018-01-29 DIAGNOSIS — D509 Iron deficiency anemia, unspecified: Secondary | ICD-10-CM | POA: Diagnosis not present

## 2018-01-29 DIAGNOSIS — K449 Diaphragmatic hernia without obstruction or gangrene: Secondary | ICD-10-CM | POA: Diagnosis not present

## 2018-01-31 ENCOUNTER — Ambulatory Visit
Admission: RE | Admit: 2018-01-31 | Discharge: 2018-01-31 | Disposition: A | Discharge status: Home / Self Care | Payer: Medicare Other | Source: Ambulatory Visit | Attending: Gynecology | Admitting: Gynecology

## 2018-01-31 DIAGNOSIS — Z803 Family history of malignant neoplasm of breast: Secondary | ICD-10-CM

## 2018-01-31 DIAGNOSIS — Z8543 Personal history of malignant neoplasm of ovary: Secondary | ICD-10-CM

## 2018-01-31 DIAGNOSIS — Z853 Personal history of malignant neoplasm of breast: Secondary | ICD-10-CM | POA: Diagnosis not present

## 2018-01-31 MED ORDER — GADOBENATE DIMEGLUMINE 529 MG/ML IV SOLN
10.0000 mL | Freq: Once | INTRAVENOUS | Status: AC | PRN
  Administered 2018-01-31: 10 mL via INTRAVENOUS

## 2018-02-03 ENCOUNTER — Encounter: Payer: Self-pay | Admitting: Gynecology

## 2018-02-26 DIAGNOSIS — H903 Sensorineural hearing loss, bilateral: Secondary | ICD-10-CM | POA: Diagnosis not present

## 2018-03-21 DIAGNOSIS — M25551 Pain in right hip: Secondary | ICD-10-CM | POA: Diagnosis not present

## 2018-03-21 DIAGNOSIS — T07XXXA Unspecified multiple injuries, initial encounter: Secondary | ICD-10-CM | POA: Diagnosis not present

## 2018-03-31 DIAGNOSIS — M5441 Lumbago with sciatica, right side: Secondary | ICD-10-CM | POA: Diagnosis not present

## 2018-03-31 DIAGNOSIS — M25551 Pain in right hip: Secondary | ICD-10-CM | POA: Diagnosis not present

## 2018-04-02 DIAGNOSIS — M25562 Pain in left knee: Secondary | ICD-10-CM | POA: Diagnosis not present

## 2018-04-02 DIAGNOSIS — M25551 Pain in right hip: Secondary | ICD-10-CM | POA: Diagnosis not present

## 2018-04-03 DIAGNOSIS — M25551 Pain in right hip: Secondary | ICD-10-CM | POA: Diagnosis not present

## 2018-04-03 DIAGNOSIS — M25562 Pain in left knee: Secondary | ICD-10-CM | POA: Diagnosis not present

## 2018-04-07 DIAGNOSIS — M25551 Pain in right hip: Secondary | ICD-10-CM | POA: Diagnosis not present

## 2018-04-07 DIAGNOSIS — M25562 Pain in left knee: Secondary | ICD-10-CM | POA: Diagnosis not present

## 2018-04-10 DIAGNOSIS — M25551 Pain in right hip: Secondary | ICD-10-CM | POA: Diagnosis not present

## 2018-04-10 DIAGNOSIS — M25562 Pain in left knee: Secondary | ICD-10-CM | POA: Diagnosis not present

## 2018-04-14 DIAGNOSIS — M25551 Pain in right hip: Secondary | ICD-10-CM | POA: Diagnosis not present

## 2018-04-14 DIAGNOSIS — M25562 Pain in left knee: Secondary | ICD-10-CM | POA: Diagnosis not present

## 2018-04-16 DIAGNOSIS — M25551 Pain in right hip: Secondary | ICD-10-CM | POA: Diagnosis not present

## 2018-04-16 DIAGNOSIS — Z23 Encounter for immunization: Secondary | ICD-10-CM | POA: Diagnosis not present

## 2018-04-17 DIAGNOSIS — M25562 Pain in left knee: Secondary | ICD-10-CM | POA: Diagnosis not present

## 2018-04-17 DIAGNOSIS — M25551 Pain in right hip: Secondary | ICD-10-CM | POA: Diagnosis not present

## 2018-04-22 DIAGNOSIS — M25551 Pain in right hip: Secondary | ICD-10-CM | POA: Diagnosis not present

## 2018-04-22 DIAGNOSIS — M25562 Pain in left knee: Secondary | ICD-10-CM | POA: Diagnosis not present

## 2018-04-24 DIAGNOSIS — M25562 Pain in left knee: Secondary | ICD-10-CM | POA: Diagnosis not present

## 2018-04-24 DIAGNOSIS — M25551 Pain in right hip: Secondary | ICD-10-CM | POA: Diagnosis not present

## 2018-04-28 DIAGNOSIS — M25562 Pain in left knee: Secondary | ICD-10-CM | POA: Diagnosis not present

## 2018-04-28 DIAGNOSIS — M25551 Pain in right hip: Secondary | ICD-10-CM | POA: Diagnosis not present

## 2018-05-01 DIAGNOSIS — M25562 Pain in left knee: Secondary | ICD-10-CM | POA: Diagnosis not present

## 2018-05-01 DIAGNOSIS — M25551 Pain in right hip: Secondary | ICD-10-CM | POA: Diagnosis not present

## 2018-05-05 DIAGNOSIS — M25551 Pain in right hip: Secondary | ICD-10-CM | POA: Diagnosis not present

## 2018-05-05 DIAGNOSIS — M25562 Pain in left knee: Secondary | ICD-10-CM | POA: Diagnosis not present

## 2018-05-08 DIAGNOSIS — M25562 Pain in left knee: Secondary | ICD-10-CM | POA: Diagnosis not present

## 2018-05-08 DIAGNOSIS — M25551 Pain in right hip: Secondary | ICD-10-CM | POA: Diagnosis not present

## 2018-05-12 DIAGNOSIS — M25562 Pain in left knee: Secondary | ICD-10-CM | POA: Diagnosis not present

## 2018-05-12 DIAGNOSIS — M25551 Pain in right hip: Secondary | ICD-10-CM | POA: Diagnosis not present

## 2018-05-15 DIAGNOSIS — M25551 Pain in right hip: Secondary | ICD-10-CM | POA: Diagnosis not present

## 2018-05-15 DIAGNOSIS — M25562 Pain in left knee: Secondary | ICD-10-CM | POA: Diagnosis not present

## 2018-05-16 DIAGNOSIS — M25551 Pain in right hip: Secondary | ICD-10-CM | POA: Diagnosis not present

## 2018-05-19 DIAGNOSIS — M25562 Pain in left knee: Secondary | ICD-10-CM | POA: Diagnosis not present

## 2018-05-19 DIAGNOSIS — M25551 Pain in right hip: Secondary | ICD-10-CM | POA: Diagnosis not present

## 2018-05-22 DIAGNOSIS — M25562 Pain in left knee: Secondary | ICD-10-CM | POA: Diagnosis not present

## 2018-05-22 DIAGNOSIS — M25551 Pain in right hip: Secondary | ICD-10-CM | POA: Diagnosis not present

## 2018-06-17 DIAGNOSIS — Z029 Encounter for administrative examinations, unspecified: Secondary | ICD-10-CM | POA: Diagnosis not present

## 2018-07-04 ENCOUNTER — Other Ambulatory Visit: Payer: Self-pay | Admitting: Dermatology

## 2018-07-04 DIAGNOSIS — C44719 Basal cell carcinoma of skin of left lower limb, including hip: Secondary | ICD-10-CM | POA: Diagnosis not present

## 2018-07-04 DIAGNOSIS — C44712 Basal cell carcinoma of skin of right lower limb, including hip: Secondary | ICD-10-CM | POA: Diagnosis not present

## 2018-07-04 DIAGNOSIS — D0471 Carcinoma in situ of skin of right lower limb, including hip: Secondary | ICD-10-CM | POA: Diagnosis not present

## 2018-07-04 DIAGNOSIS — D044 Carcinoma in situ of skin of scalp and neck: Secondary | ICD-10-CM | POA: Diagnosis not present

## 2018-07-04 DIAGNOSIS — D0472 Carcinoma in situ of skin of left lower limb, including hip: Secondary | ICD-10-CM | POA: Diagnosis not present

## 2018-07-04 DIAGNOSIS — C4491 Basal cell carcinoma of skin, unspecified: Secondary | ICD-10-CM

## 2018-07-04 HISTORY — DX: Basal cell carcinoma of skin, unspecified: C44.91

## 2018-07-22 DIAGNOSIS — D500 Iron deficiency anemia secondary to blood loss (chronic): Secondary | ICD-10-CM | POA: Diagnosis not present

## 2018-07-28 DIAGNOSIS — C44719 Basal cell carcinoma of skin of left lower limb, including hip: Secondary | ICD-10-CM | POA: Diagnosis not present

## 2018-07-31 DIAGNOSIS — D0471 Carcinoma in situ of skin of right lower limb, including hip: Secondary | ICD-10-CM | POA: Diagnosis not present

## 2018-07-31 DIAGNOSIS — D0472 Carcinoma in situ of skin of left lower limb, including hip: Secondary | ICD-10-CM | POA: Diagnosis not present

## 2018-07-31 DIAGNOSIS — D044 Carcinoma in situ of skin of scalp and neck: Secondary | ICD-10-CM | POA: Diagnosis not present

## 2018-08-05 ENCOUNTER — Other Ambulatory Visit (HOSPITAL_COMMUNITY): Payer: Self-pay | Admitting: Cardiovascular Disease

## 2018-08-06 ENCOUNTER — Encounter: Payer: Self-pay | Admitting: *Deleted

## 2018-08-06 DIAGNOSIS — E785 Hyperlipidemia, unspecified: Secondary | ICD-10-CM | POA: Insufficient documentation

## 2018-08-07 ENCOUNTER — Other Ambulatory Visit (HOSPITAL_COMMUNITY): Payer: Self-pay

## 2018-08-07 MED ORDER — ROSUVASTATIN CALCIUM 20 MG PO TABS: 20.0000 mg | ORAL_TABLET | Freq: Every day | ORAL | 2 refills | Status: DC

## 2018-08-07 MED ORDER — ISOSORBIDE MONONITRATE ER 30 MG PO TB24: 15.0000 mg | ORAL_TABLET | Freq: Every day | ORAL | 2 refills | Status: DC

## 2018-09-08 DIAGNOSIS — C44719 Basal cell carcinoma of skin of left lower limb, including hip: Secondary | ICD-10-CM | POA: Diagnosis not present

## 2018-09-16 ENCOUNTER — Other Ambulatory Visit (HOSPITAL_COMMUNITY): Payer: Self-pay

## 2018-09-16 MED ORDER — ROSUVASTATIN CALCIUM 20 MG PO TABS: 20.0000 mg | ORAL_TABLET | Freq: Every day | ORAL | 2 refills | Status: DC

## 2018-10-01 DIAGNOSIS — M25561 Pain in right knee: Secondary | ICD-10-CM | POA: Diagnosis not present

## 2018-10-01 DIAGNOSIS — M25551 Pain in right hip: Secondary | ICD-10-CM | POA: Diagnosis not present

## 2018-10-06 DIAGNOSIS — M25561 Pain in right knee: Secondary | ICD-10-CM | POA: Diagnosis not present

## 2018-10-06 DIAGNOSIS — M7071 Other bursitis of hip, right hip: Secondary | ICD-10-CM | POA: Diagnosis not present

## 2018-10-08 DIAGNOSIS — M7051 Other bursitis of knee, right knee: Secondary | ICD-10-CM | POA: Diagnosis not present

## 2018-10-08 DIAGNOSIS — M25561 Pain in right knee: Secondary | ICD-10-CM | POA: Diagnosis not present

## 2018-10-13 DIAGNOSIS — M25561 Pain in right knee: Secondary | ICD-10-CM | POA: Diagnosis not present

## 2018-10-13 DIAGNOSIS — M7071 Other bursitis of hip, right hip: Secondary | ICD-10-CM | POA: Diagnosis not present

## 2018-10-15 ENCOUNTER — Telehealth (HOSPITAL_COMMUNITY): Payer: Self-pay | Admitting: Vascular Surgery

## 2018-10-15 NOTE — Telephone Encounter (Signed)
Called pt several times, busy signal

## 2018-10-16 DIAGNOSIS — M7071 Other bursitis of hip, right hip: Secondary | ICD-10-CM | POA: Diagnosis not present

## 2018-10-16 DIAGNOSIS — M25561 Pain in right knee: Secondary | ICD-10-CM | POA: Diagnosis not present

## 2018-10-21 DIAGNOSIS — M7071 Other bursitis of hip, right hip: Secondary | ICD-10-CM | POA: Diagnosis not present

## 2018-10-21 DIAGNOSIS — M25561 Pain in right knee: Secondary | ICD-10-CM | POA: Diagnosis not present

## 2018-10-22 ENCOUNTER — Ambulatory Visit (HOSPITAL_COMMUNITY)
Admission: RE | Admit: 2018-10-22 | Discharge: 2018-10-22 | Disposition: A | Discharge status: Home / Self Care | Payer: Medicare Other | Source: Ambulatory Visit | Attending: Cardiology | Admitting: Cardiology

## 2018-10-22 ENCOUNTER — Encounter (HOSPITAL_COMMUNITY): Payer: Self-pay | Admitting: *Deleted

## 2018-10-22 ENCOUNTER — Other Ambulatory Visit: Payer: Self-pay

## 2018-10-22 ENCOUNTER — Encounter (HOSPITAL_COMMUNITY): Payer: Medicare Other | Admitting: Internal Medicine

## 2018-10-22 DIAGNOSIS — I2510 Atherosclerotic heart disease of native coronary artery without angina pectoris: Secondary | ICD-10-CM | POA: Diagnosis not present

## 2018-10-22 MED ORDER — ROSUVASTATIN CALCIUM 20 MG PO TABS: 20.0000 mg | ORAL_TABLET | Freq: Every day | ORAL | 3 refills | Status: DC

## 2018-10-22 MED ORDER — ISOSORBIDE MONONITRATE ER 30 MG PO TB24: 15.0000 mg | ORAL_TABLET | Freq: Every day | ORAL | 3 refills | Status: DC
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
## 2018-10-22 NOTE — Progress Notes (Signed)
AVS sent to pt via mychart. 

## 2018-10-22 NOTE — Patient Instructions (Signed)
Refills for your Imdur and Crestor have been sent to [facility] with a 90 day supply  Please contact us in 1 year to schedule your next appointment.

## 2018-10-22 NOTE — Addendum Note (Signed)
Encounter addended by: Scarlette Calico, RN on: 10/22/2018 3:09 PM  Actions taken: Clinical Note Signed

## 2018-10-23 DIAGNOSIS — M25561 Pain in right knee: Secondary | ICD-10-CM | POA: Diagnosis not present

## 2018-10-23 DIAGNOSIS — M7071 Other bursitis of hip, right hip: Secondary | ICD-10-CM | POA: Diagnosis not present

## 2018-11-03 DIAGNOSIS — M7071 Other bursitis of hip, right hip: Secondary | ICD-10-CM | POA: Diagnosis not present

## 2018-11-03 DIAGNOSIS — M25561 Pain in right knee: Secondary | ICD-10-CM | POA: Diagnosis not present

## 2018-11-06 DIAGNOSIS — M7071 Other bursitis of hip, right hip: Secondary | ICD-10-CM | POA: Diagnosis not present

## 2018-11-06 DIAGNOSIS — M25561 Pain in right knee: Secondary | ICD-10-CM | POA: Diagnosis not present

## 2018-11-10 DIAGNOSIS — M25561 Pain in right knee: Secondary | ICD-10-CM | POA: Diagnosis not present

## 2018-11-10 DIAGNOSIS — M7071 Other bursitis of hip, right hip: Secondary | ICD-10-CM | POA: Diagnosis not present

## 2018-11-12 DIAGNOSIS — M25561 Pain in right knee: Secondary | ICD-10-CM | POA: Diagnosis not present

## 2018-11-12 DIAGNOSIS — M7071 Other bursitis of hip, right hip: Secondary | ICD-10-CM | POA: Diagnosis not present

## 2018-11-17 ENCOUNTER — Other Ambulatory Visit: Payer: Self-pay | Admitting: Gynecology

## 2018-11-17 DIAGNOSIS — Z1231 Encounter for screening mammogram for malignant neoplasm of breast: Secondary | ICD-10-CM

## 2018-11-17 DIAGNOSIS — M25561 Pain in right knee: Secondary | ICD-10-CM | POA: Diagnosis not present

## 2018-11-17 DIAGNOSIS — M7071 Other bursitis of hip, right hip: Secondary | ICD-10-CM | POA: Diagnosis not present

## 2018-11-20 DIAGNOSIS — M25561 Pain in right knee: Secondary | ICD-10-CM | POA: Diagnosis not present

## 2018-11-20 DIAGNOSIS — M7071 Other bursitis of hip, right hip: Secondary | ICD-10-CM | POA: Diagnosis not present

## 2018-11-25 DIAGNOSIS — M25561 Pain in right knee: Secondary | ICD-10-CM | POA: Diagnosis not present

## 2018-11-25 DIAGNOSIS — M7071 Other bursitis of hip, right hip: Secondary | ICD-10-CM | POA: Diagnosis not present

## 2018-11-27 DIAGNOSIS — M7071 Other bursitis of hip, right hip: Secondary | ICD-10-CM | POA: Diagnosis not present

## 2018-11-27 DIAGNOSIS — M25561 Pain in right knee: Secondary | ICD-10-CM | POA: Diagnosis not present

## 2018-12-02 DIAGNOSIS — M25561 Pain in right knee: Secondary | ICD-10-CM | POA: Diagnosis not present

## 2018-12-02 DIAGNOSIS — M7071 Other bursitis of hip, right hip: Secondary | ICD-10-CM | POA: Diagnosis not present

## 2018-12-04 ENCOUNTER — Encounter: Payer: Medicare Other | Admitting: Gynecology

## 2018-12-04 DIAGNOSIS — M7071 Other bursitis of hip, right hip: Secondary | ICD-10-CM | POA: Diagnosis not present

## 2018-12-04 DIAGNOSIS — M25561 Pain in right knee: Secondary | ICD-10-CM | POA: Diagnosis not present

## 2018-12-17 DIAGNOSIS — M25551 Pain in right hip: Secondary | ICD-10-CM | POA: Diagnosis not present

## 2018-12-18 DIAGNOSIS — D500 Iron deficiency anemia secondary to blood loss (chronic): Secondary | ICD-10-CM | POA: Diagnosis not present

## 2018-12-18 DIAGNOSIS — M85859 Other specified disorders of bone density and structure, unspecified thigh: Secondary | ICD-10-CM | POA: Diagnosis not present

## 2018-12-18 DIAGNOSIS — I2510 Atherosclerotic heart disease of native coronary artery without angina pectoris: Secondary | ICD-10-CM | POA: Diagnosis not present

## 2018-12-18 DIAGNOSIS — Z0000 Encounter for general adult medical examination without abnormal findings: Secondary | ICD-10-CM | POA: Diagnosis not present

## 2018-12-18 DIAGNOSIS — C569 Malignant neoplasm of unspecified ovary: Secondary | ICD-10-CM | POA: Diagnosis not present

## 2018-12-18 DIAGNOSIS — H9190 Unspecified hearing loss, unspecified ear: Secondary | ICD-10-CM | POA: Diagnosis not present

## 2018-12-18 DIAGNOSIS — N390 Urinary tract infection, site not specified: Secondary | ICD-10-CM | POA: Diagnosis not present

## 2019-01-15 ENCOUNTER — Ambulatory Visit
Admission: RE | Admit: 2019-01-15 | Discharge: 2019-01-15 | Disposition: A | Discharge status: Home / Self Care | Payer: Medicare Other | Source: Ambulatory Visit | Attending: Gynecology | Admitting: Gynecology

## 2019-01-15 DIAGNOSIS — Z1231 Encounter for screening mammogram for malignant neoplasm of breast: Secondary | ICD-10-CM | POA: Diagnosis not present

## 2019-01-16 ENCOUNTER — Encounter: Payer: Medicare Other | Admitting: Gynecology

## 2019-01-22 ENCOUNTER — Other Ambulatory Visit: Payer: Self-pay

## 2019-01-22 DIAGNOSIS — H5203 Hypermetropia, bilateral: Secondary | ICD-10-CM | POA: Diagnosis not present

## 2019-01-22 DIAGNOSIS — H2513 Age-related nuclear cataract, bilateral: Secondary | ICD-10-CM | POA: Diagnosis not present

## 2019-01-22 DIAGNOSIS — H524 Presbyopia: Secondary | ICD-10-CM | POA: Diagnosis not present

## 2019-01-22 DIAGNOSIS — H02052 Trichiasis without entropian right lower eyelid: Secondary | ICD-10-CM | POA: Diagnosis not present

## 2019-01-23 ENCOUNTER — Encounter: Payer: Self-pay | Admitting: Gynecology

## 2019-01-23 ENCOUNTER — Ambulatory Visit (INDEPENDENT_AMBULATORY_CARE_PROVIDER_SITE_OTHER): Payer: Medicare Other | Admitting: Gynecology

## 2019-01-23 ENCOUNTER — Other Ambulatory Visit: Payer: Self-pay

## 2019-01-23 VITALS — BP 118/72 | Ht 65.0 in | Wt 114.0 lb

## 2019-01-23 DIAGNOSIS — Z8543 Personal history of malignant neoplasm of ovary: Secondary | ICD-10-CM

## 2019-01-23 DIAGNOSIS — Z01419 Encounter for gynecological examination (general) (routine) without abnormal findings: Secondary | ICD-10-CM | POA: Diagnosis not present

## 2019-01-23 DIAGNOSIS — M8580 Other specified disorders of bone density and structure, unspecified site: Secondary | ICD-10-CM | POA: Diagnosis not present

## 2019-01-23 DIAGNOSIS — N952 Postmenopausal atrophic vaginitis: Secondary | ICD-10-CM | POA: Diagnosis not present

## 2019-01-23 MED ORDER — ESTRADIOL 2 MG VA RING: 2.0000 mg | VAGINAL_RING | VAGINAL | 4 refills | Status: DC

## 2019-01-23 NOTE — Progress Notes (Signed)
    Barbara Schwartz 1945-11-14 [id number]        73 y.o.  G2P1011 for breast and pelvic exam.  Without gynecologic complaints  Past medical history,surgical history, problem list, medications, allergies, family history and social history were all reviewed and documented as reviewed in the EPIC chart.  ROS:  Performed with pertinent positives and negatives included in the history, assessment and plan.   Additional significant findings : None   Exam: Caryn Bee assistant Vitals:   01/23/19 1129  BP: 118/72  Weight: 114 lb (51.7 kg)  Height: 5\' 5"  (1.651 m)   Body mass index is 18.97 kg/m.  General appearance:  Normal affect, orientation and appearance. Skin: Grossly normal HEENT: Without gross lesions.  No cervical or supraclavicular adenopathy. Thyroid normal.  Lungs:  Clear without wheezing, rales or rhonchi Cardiac: RR, without RMG Abdominal:  Soft, nontender, without masses, guarding, rebound, organomegaly or hernia Breasts:  Examined lying and sitting without masses, retractions, discharge or axillary adenopathy. Pelvic:  Ext, BUS, Vagina: With atrophic changes  Adnexa: Without masses or tenderness    Anus and perineum: Normal   Rectovaginal: Normal sphincter tone without palpated masses or tenderness.    Assessment/Plan:  73 y.o. G25P1011 female for breast and pelvic exam  1. Postmenopausal/atrophic genital changes.  Continues with Estring 0.05 mg for vaginal atrophy and bladder support decreasing UTIs.  Is doing well with this.  We again discussed the issues and risks to include absorption with systemic effects such as thrombosis and breast stimulation.  She is comfortable continuing.  Refill x1 year provided. 2. History of stage III ovarian carcinoma status post TAH/BSO 1998.  Exam NED.  Check CA 125 today.  Was never genetically tested and we discussed this and she is not interested in pursuing. 3. Mammography 12/2018.  Strong family history of breast cancer.  Doing  annual MRIs also due to this history and her ovarian cancer history although she was never genetically tested.  Will arrange for MRI.  Continue with annual mammography 4. Colonoscopy 2015.  Repeat at their recommended interval. 5. Pap smear 2017.  No Pap smear done today.  No history of abnormal Pap smears.  We both agree to stop screening per current screening guidelines. 6. Health maintenance.  No routine lab work done as patient does this elsewhere.  Follow-up 1 year, sooner as needed.   [name], 12:01 PM 01/23/2019

## 2019-01-23 NOTE — Patient Instructions (Signed)
Office will call to arrange for the MRI.  Follow-up in 1 year for annual exam

## 2019-01-26 ENCOUNTER — Encounter: Payer: Self-pay | Admitting: Gynecology

## 2019-01-26 LAB — CA 125: CA 125: 10 U/mL (ref <35)

## 2019-01-27 ENCOUNTER — Telehealth: Payer: Self-pay | Admitting: *Deleted

## 2019-01-27 ENCOUNTER — Encounter: Payer: Self-pay | Admitting: *Deleted

## 2019-01-27 DIAGNOSIS — Z803 Family history of malignant neoplasm of breast: Secondary | ICD-10-CM

## 2019-01-27 NOTE — Telephone Encounter (Signed)
MRI is scheduled for 7/29 at 11am

## 2019-01-27 NOTE — Telephone Encounter (Signed)
Order placed at [facility] sent my chart message to patient to call and schedule.

## 2019-01-27 NOTE — Telephone Encounter (Signed)
-----   Message from Anastasio Auerbach, MD sent at 01/23/2019 12:00 PM EDT ----- Arrange for breast MRI reference dense breast with strong family history of breast cancer.  Has been doing annual MRIs

## 2019-02-03 ENCOUNTER — Encounter: Payer: Self-pay | Admitting: *Deleted

## 2019-02-18 ENCOUNTER — Encounter: Payer: Self-pay | Admitting: Gynecology

## 2019-02-18 ENCOUNTER — Ambulatory Visit
Admission: RE | Admit: 2019-02-18 | Discharge: 2019-02-18 | Disposition: A | Discharge status: Home / Self Care | Payer: Medicare Other | Source: Ambulatory Visit | Attending: Gynecology | Admitting: Gynecology

## 2019-02-18 DIAGNOSIS — Z803 Family history of malignant neoplasm of breast: Secondary | ICD-10-CM

## 2019-02-18 DIAGNOSIS — Z1239 Encounter for other screening for malignant neoplasm of breast: Secondary | ICD-10-CM | POA: Diagnosis not present

## 2019-02-18 MED ORDER — GADOBUTROL 1 MMOL/ML IV SOLN
5.0000 mL | Freq: Once | INTRAVENOUS | Status: AC | PRN
  Administered 2019-02-18: 5 mL via INTRAVENOUS

## 2019-03-02 ENCOUNTER — Other Ambulatory Visit: Payer: Self-pay

## 2019-03-03 ENCOUNTER — Other Ambulatory Visit: Payer: Self-pay | Admitting: Gynecology

## 2019-03-03 ENCOUNTER — Encounter: Payer: Self-pay | Admitting: Gynecology

## 2019-03-03 ENCOUNTER — Ambulatory Visit (INDEPENDENT_AMBULATORY_CARE_PROVIDER_SITE_OTHER): Payer: Medicare Other

## 2019-03-03 DIAGNOSIS — Z780 Asymptomatic menopausal state: Secondary | ICD-10-CM

## 2019-03-03 DIAGNOSIS — M8589 Other specified disorders of bone density and structure, multiple sites: Secondary | ICD-10-CM

## 2019-03-03 DIAGNOSIS — M8580 Other specified disorders of bone density and structure, unspecified site: Secondary | ICD-10-CM

## 2019-04-07 DIAGNOSIS — Z23 Encounter for immunization: Secondary | ICD-10-CM | POA: Diagnosis not present

## 2019-04-08 ENCOUNTER — Other Ambulatory Visit: Payer: Self-pay | Admitting: Dermatology

## 2019-04-08 DIAGNOSIS — D485 Neoplasm of uncertain behavior of skin: Secondary | ICD-10-CM | POA: Diagnosis not present

## 2019-04-08 DIAGNOSIS — L570 Actinic keratosis: Secondary | ICD-10-CM | POA: Diagnosis not present

## 2019-04-08 DIAGNOSIS — L859 Epidermal thickening, unspecified: Secondary | ICD-10-CM | POA: Diagnosis not present

## 2019-04-30 ENCOUNTER — Encounter: Payer: Self-pay | Admitting: Gynecology

## 2019-05-19 DIAGNOSIS — Z1159 Encounter for screening for other viral diseases: Secondary | ICD-10-CM | POA: Diagnosis not present

## 2019-05-25 DIAGNOSIS — Z86010 Personal history of colonic polyps: Secondary | ICD-10-CM | POA: Diagnosis not present

## 2019-06-02 DIAGNOSIS — D045 Carcinoma in situ of skin of trunk: Secondary | ICD-10-CM | POA: Diagnosis not present

## 2019-06-02 DIAGNOSIS — L089 Local infection of the skin and subcutaneous tissue, unspecified: Secondary | ICD-10-CM | POA: Diagnosis not present

## 2019-06-02 DIAGNOSIS — L080 Pyoderma: Secondary | ICD-10-CM | POA: Diagnosis not present

## 2019-06-02 DIAGNOSIS — L239 Allergic contact dermatitis, unspecified cause: Secondary | ICD-10-CM | POA: Diagnosis not present

## 2019-06-15 ENCOUNTER — Other Ambulatory Visit: Payer: Self-pay | Admitting: *Deleted

## 2019-06-15 MED ORDER — ESTRADIOL 2 MG VA RING: 2.0000 mg | VAGINAL_RING | VAGINAL | 3 refills | Status: DC

## 2019-06-16 ENCOUNTER — Other Ambulatory Visit (HOSPITAL_COMMUNITY): Payer: Self-pay

## 2019-06-16 MED ORDER — ROSUVASTATIN CALCIUM 20 MG PO TABS: 20.0000 mg | ORAL_TABLET | Freq: Every day | ORAL | 3 refills | Status: DC

## 2019-06-16 MED ORDER — ISOSORBIDE MONONITRATE ER 30 MG PO TB24: 15.0000 mg | ORAL_TABLET | Freq: Every day | ORAL | 3 refills | Status: DC

## 2019-06-17 ENCOUNTER — Other Ambulatory Visit (HOSPITAL_COMMUNITY): Payer: Self-pay

## 2019-06-17 MED ORDER — ROSUVASTATIN CALCIUM 20 MG PO TABS: 20.0000 mg | ORAL_TABLET | Freq: Every day | ORAL | 3 refills | Status: DC

## 2019-06-24 ENCOUNTER — Other Ambulatory Visit (HOSPITAL_COMMUNITY): Payer: Self-pay

## 2019-06-24 ENCOUNTER — Telehealth: Payer: Self-pay

## 2019-06-24 MED ORDER — ROSUVASTATIN CALCIUM 20 MG PO TABS: 20.0000 mg | ORAL_TABLET | Freq: Every day | ORAL | 3 refills | Status: DC

## 2019-06-24 MED ORDER — ESTRADIOL 2 MG VA RING: 2.0000 mg | VAGINAL_RING | VAGINAL | 3 refills | Status: DC

## 2019-06-24 MED ORDER — ISOSORBIDE MONONITRATE ER 30 MG PO TB24: 15.0000 mg | ORAL_TABLET | Freq: Every day | ORAL | 3 refills | Status: DC

## 2019-06-24 NOTE — Telephone Encounter (Signed)
Pharmacy called requesting new Rx as patient is new their pharmacy.

## 2019-08-04 ENCOUNTER — Ambulatory Visit: Payer: Medicare Other | Attending: Internal Medicine

## 2019-08-04 DIAGNOSIS — Z23 Encounter for immunization: Secondary | ICD-10-CM

## 2019-08-04 NOTE — Progress Notes (Signed)
   Covid-19 Vaccination Clinic  Name:  BRITTNANY PACETTI    MRN: [id number] DOB: 03/23/1946  08/04/2019  Ms. Simerly was observed post Covid-19 immunization for 15 minutes without incidence. She was provided with Vaccine Information Sheet and instruction to access the V-Safe system.   Ms. Pieroni was instructed to call 911 with any severe reactions post vaccine: Marland Kitchen Difficulty breathing  . Swelling of your face and throat  . A fast heartbeat  . A bad rash all over your body  . Dizziness and weakness    Immunizations Administered    Name Date Dose VIS Date Route   Pfizer COVID-19 Vaccine 08/04/2019  8:55 AM 0.3 mL 07/03/2019 Intramuscular   Manufacturer: [place]   Lot: [id number]   [place]: [id number]

## 2019-08-24 ENCOUNTER — Ambulatory Visit: Payer: Medicare Other | Attending: Internal Medicine

## 2019-08-24 DIAGNOSIS — Z23 Encounter for immunization: Secondary | ICD-10-CM | POA: Insufficient documentation

## 2019-08-24 NOTE — Progress Notes (Signed)
   Covid-19 Vaccination Clinic  Name:  CHAMIA WIESNER    MRN: [id number] DOB: [birth year]  08/24/2019  Ms. Strang was observed post Covid-19 immunization for 15 minutes without incidence. She was provided with Vaccine Information Sheet and instruction to access the V-Safe system.   Ms. Shaya was instructed to call 911 with any severe reactions post vaccine: Marland Kitchen Difficulty breathing  . Swelling of your face and throat  . A fast heartbeat  . A bad rash all over your body  . Dizziness and weakness    Immunizations Administered    Name Date Dose VIS Date Route   Pfizer COVID-19 Vaccine 08/24/2019  8:11 AM 0.3 mL 07/03/2019 Intramuscular   Manufacturer: [place]   Lot: [id number]   [place]: [id number]

## 2019-11-02 ENCOUNTER — Encounter: Payer: Self-pay | Admitting: Dermatology

## 2019-11-02 ENCOUNTER — Ambulatory Visit (INDEPENDENT_AMBULATORY_CARE_PROVIDER_SITE_OTHER): Payer: Medicare Other | Admitting: Dermatology

## 2019-11-02 ENCOUNTER — Other Ambulatory Visit: Payer: Self-pay

## 2019-11-02 DIAGNOSIS — D485 Neoplasm of uncertain behavior of skin: Secondary | ICD-10-CM

## 2019-11-02 DIAGNOSIS — D492 Neoplasm of unspecified behavior of bone, soft tissue, and skin: Secondary | ICD-10-CM

## 2019-11-02 DIAGNOSIS — C4492 Squamous cell carcinoma of skin, unspecified: Secondary | ICD-10-CM

## 2019-11-02 DIAGNOSIS — L570 Actinic keratosis: Secondary | ICD-10-CM | POA: Diagnosis not present

## 2019-11-02 DIAGNOSIS — D0472 Carcinoma in situ of skin of left lower limb, including hip: Secondary | ICD-10-CM | POA: Diagnosis not present

## 2019-11-02 DIAGNOSIS — Z85828 Personal history of other malignant neoplasm of skin: Secondary | ICD-10-CM | POA: Diagnosis not present

## 2019-11-02 DIAGNOSIS — D0471 Carcinoma in situ of skin of right lower limb, including hip: Secondary | ICD-10-CM | POA: Diagnosis not present

## 2019-11-02 DIAGNOSIS — L820 Inflamed seborrheic keratosis: Secondary | ICD-10-CM | POA: Diagnosis not present

## 2019-11-02 HISTORY — DX: Squamous cell carcinoma of skin, unspecified: C44.92

## 2019-11-02 NOTE — Patient Instructions (Signed)

## 2019-11-02 NOTE — Progress Notes (Addendum)
   Follow-Up Visit   Subjective  Barbara Schwartz is a 73 y.o. female who presents for the following: Skin Problem (Place on right outer eye is returning. Biopsy showed hyperkeratosis with blood. Couple of places on her legs that she would like looked at. Cannot tell if they are new or old.).  New crusts Location: Legs, face, scalp Duration: Several months Quality: Right cheek bone is recurrent after previous biopsy, legs are new Associated Signs/Symptoms: Some soreness Modifying Factors:  Severity:  Timing: Context: History of multiple nonmelanoma skin cancers  The following portions of the chart were reviewed this encounter and updated as appropriate: Tobacco  Allergies  Meds  Problems  Med Hx  Surg Hx  Fam Hx      Objective  Well appearing patient in no apparent distress; mood and affect are within normal limits.  A focused examination was performed including scalp, face, neck, arms, legs.. Relevant physical exam findings are noted in the Assessment and Plan.   Assessment & Plan  AK (actinic keratosis) (3) Head - Anterior (Face)  Defer intervenion. Decide post biopsies on LN2 vs wintertime 5FU.  Neoplasm of skin (3) Right Malar Cheek  Skin / nail biopsy Type of biopsy: tangential   Informed consent: discussed and consent obtained   Anesthesia: the lesion was anesthetized in a standard fashion   Anesthetic:  1% lidocaine w/ epinephrine 1-100,000 local infiltration Instrument used: flexible razor blade   Hemostasis achieved with: ferric subsulfate   Outcome: patient tolerated procedure well   Post-procedure details: sterile dressing applied and wound care instructions given   Dressing type: petrolatum   Additional details:  Patient identified lesion of concern.  Lesion identified by physician.  Specimen 1 - Surgical pathology Differential Diagnosis: scc vs bcc Check Margins: No  Right Lower Leg - Posterior  Skin / nail biopsy Type of biopsy: tangential     Informed consent: discussed and consent obtained   Anesthesia: the lesion was anesthetized in a standard fashion   Anesthetic:  1% lidocaine w/ epinephrine 1-100,000 local infiltration Instrument used: flexible razor blade   Hemostasis achieved with: ferric subsulfate   Outcome: patient tolerated procedure well   Post-procedure details: sterile dressing applied and wound care instructions given   Dressing type: petrolatum   Additional details:  Patient identified lesion of concern.  Lesion identified by physician.  Specimen 2 - Surgical pathology Differential Diagnosis: scc vs bcc Check Margins: No  Left Lower Leg - Posterior  Skin / nail biopsy Type of biopsy: tangential   Informed consent: discussed and consent obtained   Anesthesia: the lesion was anesthetized in a standard fashion   Anesthetic:  1% lidocaine w/ epinephrine 1-100,000 local infiltration Instrument used: flexible razor blade   Hemostasis achieved with: ferric subsulfate   Outcome: patient tolerated procedure well   Post-procedure details: sterile dressing applied and wound care instructions given   Dressing type: petrolatum   Additional details:  Patient identified lesion of concern.  Lesion identified by physician.  Specimen 3 - Surgical pathology Differential Diagnosis: scc vs bcc Check Margins: No

## 2019-11-04 ENCOUNTER — Encounter: Payer: Self-pay | Admitting: *Deleted

## 2019-11-04 ENCOUNTER — Telehealth: Payer: Self-pay | Admitting: *Deleted

## 2019-11-04 NOTE — Telephone Encounter (Signed)
PATHOLOGY RESULTS TO PATIENT. INFORMED THAT SHE HAS A SQUAMOUS IN SITU ON RIGHT LOWER LEG AND LEFT LOWER LEG AND 30 MINUTE SURGERY SCHEDULED WITH DR. Denna Haggard ON  MAY 6 AT 8:30

## 2019-11-05 NOTE — Telephone Encounter (Signed)
See previous note This encounter was created in error - please disregard.

## 2019-11-25 ENCOUNTER — Encounter: Payer: Self-pay | Admitting: *Deleted

## 2019-11-26 ENCOUNTER — Ambulatory Visit (INDEPENDENT_AMBULATORY_CARE_PROVIDER_SITE_OTHER): Payer: Medicare Other | Admitting: Dermatology

## 2019-11-26 ENCOUNTER — Other Ambulatory Visit: Payer: Self-pay

## 2019-11-26 ENCOUNTER — Encounter: Payer: Self-pay | Admitting: Dermatology

## 2019-11-26 DIAGNOSIS — D099 Carcinoma in situ, unspecified: Secondary | ICD-10-CM

## 2019-11-26 DIAGNOSIS — D0472 Carcinoma in situ of skin of left lower limb, including hip: Secondary | ICD-10-CM

## 2019-11-26 DIAGNOSIS — D0471 Carcinoma in situ of skin of right lower limb, including hip: Secondary | ICD-10-CM | POA: Diagnosis not present

## 2019-11-26 MED ORDER — MUPIROCIN 2 % EX OINT: 1.0000 "application " | TOPICAL_OINTMENT | Freq: Two times a day (BID) | CUTANEOUS | 2 refills | Status: DC

## 2019-11-26 NOTE — Patient Instructions (Addendum)
Biopsy, Surgery (Curettage) & Surgery (Excision) Aftercare Instructions  1. Okay to remove bandage in 24 hours  2. Wash area with soap and water  3. Apply Mupirocin to area twice daily until healed (Not Neosporin)  4. Okay to cover with a Band-Aid to decrease the chance of infection or prevent irritation from clothing; also it's okay to uncover lesion at home.  5. Suture instructions: return to our office in 7-10 or 10-14 days for a nurse visit for suture removal. Variable healing with sutures, if pain or itching occurs call our office. It's okay to shower or bathe 24 hours after sutures are given.  6. The following risks may occur after a biopsy, curettage or excision: bleeding, scarring, discoloration, recurrence, infection (redness, yellow drainage, pain or swelling).  7. For questions, concerns and results call our office at [place] before 4pm & Friday before 3pm. Biopsy results will be available in 1 week.

## 2019-11-26 NOTE — Progress Notes (Signed)
   Follow-Up Visit   Subjective  Barbara Schwartz is a 74 y.o. female who presents for the following: Procedure (here for tx right lower leg-posterior & left lower leg posterior x2) and Procedure.  CIS x2 Location: Left and right lower leg Duration:  Quality:  Associated Signs/Symptoms: Modifying Factors:  Severity:  Timing: Context: For treatment  The following portions of the chart were reviewed this encounter and updated as appropriate:     Objective  Well appearing patient in no apparent distress; mood and affect are within normal limits.  A focused examination was performed including legs and face.. Relevant physical exam findings are noted in the Assessment and Plan.   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 Lower Leg - Posterior  Destruction of lesion Complexity: simple   Destruction method: electrodesiccation and curettage   Informed consent: discussed and consent obtained   Timeout:  patient name, date of birth, surgical site, and procedure verified Anesthesia: the lesion was anesthetized in a standard fashion   Anesthetic:  1% lidocaine w/ epinephrine 1-100,000 local infiltration Curettage performed in three different directions: Yes   Curettage cycles:  3 Lesion length (cm):  1.5 Lesion width (cm):  1 Margin per side (cm):  0 Final wound size (cm):  1.5 Hemostasis achieved with:  ferric subsulfate Outcome: patient tolerated procedure well with no complications   Post-procedure details: wound care instructions given   Additional details:  Inoculated with parenteral 5% fluorouracil  Right Lower Leg - Posterior  Destruction of lesion Complexity: simple   Destruction method: electrodesiccation and curettage   Informed consent: discussed and consent obtained   Timeout:  patient name, date of birth, surgical site, and procedure verified Anesthesia: the lesion was anesthetized in a standard fashion   Anesthetic:  1% lidocaine w/  epinephrine 1-100,000 local infiltration Curettage performed in three different directions: Yes   Curettage cycles:  3 Lesion length (cm):  1.9 Lesion width (cm):  1 Hemostasis achieved with:  ferric subsulfate Outcome: patient tolerated procedure well with no complications   Post-procedure details: wound care instructions given   Additional details:  Inoculated with parenteral 5% fluorouracil

## 2019-11-29 ENCOUNTER — Encounter: Payer: Self-pay | Admitting: Dermatology

## 2019-12-03 ENCOUNTER — Other Ambulatory Visit: Payer: Self-pay | Admitting: Obstetrics and Gynecology

## 2019-12-03 DIAGNOSIS — Z1231 Encounter for screening mammogram for malignant neoplasm of breast: Secondary | ICD-10-CM

## 2020-01-18 ENCOUNTER — Ambulatory Visit
Admission: RE | Admit: 2020-01-18 | Discharge: 2020-01-18 | Disposition: A | Discharge status: Home / Self Care | Payer: Medicare Other | Source: Ambulatory Visit | Attending: Obstetrics and Gynecology | Admitting: Obstetrics and Gynecology

## 2020-01-18 ENCOUNTER — Other Ambulatory Visit: Payer: Self-pay

## 2020-01-18 DIAGNOSIS — Z1231 Encounter for screening mammogram for malignant neoplasm of breast: Secondary | ICD-10-CM

## 2020-01-26 ENCOUNTER — Ambulatory Visit (INDEPENDENT_AMBULATORY_CARE_PROVIDER_SITE_OTHER): Payer: Medicare Other | Admitting: Obstetrics and Gynecology

## 2020-01-26 ENCOUNTER — Telehealth: Payer: Self-pay | Admitting: *Deleted

## 2020-01-26 ENCOUNTER — Other Ambulatory Visit: Payer: Self-pay

## 2020-01-26 ENCOUNTER — Ambulatory Visit: Payer: Medicare Other | Admitting: Gynecology

## 2020-01-26 ENCOUNTER — Encounter: Payer: Self-pay | Admitting: Obstetrics and Gynecology

## 2020-01-26 VITALS — BP 116/74 | Ht 65.0 in | Wt 117.0 lb

## 2020-01-26 DIAGNOSIS — N952 Postmenopausal atrophic vaginitis: Secondary | ICD-10-CM

## 2020-01-26 DIAGNOSIS — Z01419 Encounter for gynecological examination (general) (routine) without abnormal findings: Secondary | ICD-10-CM | POA: Diagnosis not present

## 2020-01-26 DIAGNOSIS — Z9289 Personal history of other medical treatment: Secondary | ICD-10-CM

## 2020-01-26 DIAGNOSIS — Z8543 Personal history of malignant neoplasm of ovary: Secondary | ICD-10-CM | POA: Diagnosis not present

## 2020-01-26 DIAGNOSIS — Z803 Family history of malignant neoplasm of breast: Secondary | ICD-10-CM

## 2020-01-26 DIAGNOSIS — M8580 Other specified disorders of bone density and structure, unspecified site: Secondary | ICD-10-CM

## 2020-01-26 MED ORDER — ESTRADIOL 2 MG VA RING: 2.0000 mg | VAGINAL_RING | VAGINAL | 3 refills | Status: DC

## 2020-01-26 NOTE — Telephone Encounter (Signed)
Orders placed at [facility], my chart message sent for patient to schedule.

## 2020-01-26 NOTE — Telephone Encounter (Signed)
-----   Message from Joseph Pierini, MD sent at 01/26/2020  2:47 PM EDT ----- Regarding: Breast MRI Please help this patient arrange she screening breast MRI given her elevated breast cancer risk.  She typically does this annually.

## 2020-01-26 NOTE — Progress Notes (Signed)
Barbara Schwartz July 08, 1946 [id number]  SUBJECTIVE:  74 y.o. G44P1011 female here for an annual routine gynecologic exam. She has no gynecologic concerns.  No bloating, early satiety, appetite change, nausea, pelvic pain, groin lymph node lumps.  Current Outpatient Medications  Medication Sig Dispense Refill  . aspirin EC 81 MG tablet Take 81 mg by mouth daily.    . Calcium Carb-Cholecalciferol (CALCIUM 600+D3 PO) Take 1 tablet by mouth 2 (two) times daily.    Marland Kitchen estradiol (ESTRING) 2 MG vaginal ring Place 2 mg vaginally every 3 (three) months. follow package directions 1 each 3  . ibuprofen (ADVIL,MOTRIN) 200 MG tablet Take 200-600 mg by mouth every 8 (eight) hours as needed (for pain).    . isosorbide mononitrate (IMDUR) 30 MG 24 hr tablet Take 0.5 tablets (15 mg total) by mouth daily. 45 tablet 3  . rosuvastatin (CRESTOR) 20 MG tablet Take 1 tablet (20 mg total) by mouth at bedtime. 90 tablet 3  . Investigational - Study Medication Take 3 capsules by mouth daily. Study name:Cosmos Study Trial-[employer] Supplement & Multivitamin   Additional study details: [facility] [place] (Patient not taking: Reported on 01/26/2020)    . mupirocin ointment (BACTROBAN) 2 % Apply 1 application topically 2 (two) times daily. (Patient not taking: Reported on 01/26/2020) 22 g 2   No current facility-administered medications for this visit.   Allergies: Albuterol and Xopenex [levalbuterol hcl]  No LMP recorded (lmp unknown). Patient has had a hysterectomy.  Past medical history,surgical history, problem list, medications, allergies, family history and social history were all reviewed and documented as reviewed in the EPIC chart.  ROS:  Feeling well. No dyspnea or chest pain on exertion.  No abdominal pain, change in bowel habits, black or bloody stools.  No urinary tract symptoms. GYN ROS: no abnormal bleeding, pelvic pain or discharge, no breast pain or new or enlarging lumps on self  exam. No neurological complaints.   OBJECTIVE:  BP 116/74   Ht 5\' 5"  (1.651 m)   Wt 117 lb (53.1 kg)   LMP  (LMP Unknown)   BMI 19.47 kg/m  The patient appears well, alert, oriented x 3, in no distress. ENT normal.  Neck supple. No cervical or supraclavicular adenopathy or thyromegaly.  Lungs are clear, good air entry, no wheezes, rhonchi or rales. S1 and S2 normal, no murmurs, regular rate and rhythm.  Abdomen soft without tenderness, guarding, mass or organomegaly.  Neurological is normal, no focal findings.  BREAST EXAM: breasts appear normal, no suspicious masses, no skin or nipple changes or axillary nodes  PELVIC EXAM: VULVA: normal appearing vulva with no masses, tenderness or lesions, VAGINA: normal appearing vagina with normal color and discharge, no lesions, CERVIX: surgically absent, UTERUS: surgically absent, vaginal cuff normal, ADNEXA: no masses, non tender, no inguinal lymphadenopathy  Chaperone: Caryn Bee present during the examination  ASSESSMENT:  74 y.o. G2P1011 here for annual gynecologic exam  PLAN:   1. Postmenopausal/vaginal atrophy. Prior TAH/BSO 1998 for ovarian cancer.  Continues on Estring 0.05 mg for vaginal atrophy and decreased UTI recurrence.  She is doing well with this and would like to continue.  We reviewed the low risk of systemic absorption and risks to include thrombotic diseases such as heart attack, stroke, DVT, PE, and the breast cancer issue. 2.  History of stage IIIC ovarian cancer.  Exam NED today.  She is followed with an annual CA-125 which is ordered today.  She was apparently  never genetically tested and she is not interested in pursuing this. 3. Pap smear 2017.  No significant history of abnormal Pap smears.  Comfortable not screening following the current guidelines. 4. Mammogram 12/2019.  Normal breast exam today.  Apparently a strong family history of breast cancer, so she has additional screening with annual breast MRIs.  We will  continue with annual mammograms and repeat the breast MRI.  We will have staff contact her to help arrange this. 5. Colonoscopy 2020.  Recommended that she follow up at the recommended interval.   6.  Osteopenia.  DEXA 02/2019.  T score -1.9, BMD stable, FRAX 18% / 9.6%.  Next DEXA recommended 02/2021 so we will discuss this at the time of her next annual exam. 7. Health maintenance.  No labs today as she normally has these completed elsewhere.  Return annually or sooner, prn.  Joseph Pierini MD 01/26/20

## 2020-01-27 LAB — CA 125: CA 125: 8 U/mL (ref <35)

## 2020-01-27 NOTE — Telephone Encounter (Signed)
Patient scheduled on 02/23/20 @ 1:00pm

## 2020-02-07 DIAGNOSIS — Z20828 Contact with and (suspected) exposure to other viral communicable diseases: Secondary | ICD-10-CM | POA: Diagnosis not present

## 2020-02-10 DIAGNOSIS — Z20828 Contact with and (suspected) exposure to other viral communicable diseases: Secondary | ICD-10-CM | POA: Diagnosis not present

## 2020-02-23 ENCOUNTER — Ambulatory Visit
Admission: RE | Admit: 2020-02-23 | Discharge: 2020-02-23 | Disposition: A | Discharge status: Home / Self Care | Payer: Medicare Other | Source: Ambulatory Visit | Attending: Obstetrics and Gynecology | Admitting: Obstetrics and Gynecology

## 2020-02-23 ENCOUNTER — Other Ambulatory Visit: Payer: Self-pay

## 2020-02-23 DIAGNOSIS — Z803 Family history of malignant neoplasm of breast: Secondary | ICD-10-CM | POA: Diagnosis not present

## 2020-02-23 MED ORDER — GADOBUTROL 1 MMOL/ML IV SOLN
5.0000 mL | Freq: Once | INTRAVENOUS | Status: AC | PRN
  Administered 2020-02-23: 5 mL via INTRAVENOUS

## 2020-03-01 DIAGNOSIS — H9011 Conductive hearing loss, unilateral, right ear, with unrestricted hearing on the contralateral side: Secondary | ICD-10-CM | POA: Diagnosis not present

## 2020-03-01 DIAGNOSIS — H6981 Other specified disorders of Eustachian tube, right ear: Secondary | ICD-10-CM | POA: Diagnosis not present

## 2020-03-13 DIAGNOSIS — Z23 Encounter for immunization: Secondary | ICD-10-CM | POA: Diagnosis not present

## 2020-03-21 DIAGNOSIS — H838X3 Other specified diseases of inner ear, bilateral: Secondary | ICD-10-CM | POA: Diagnosis not present

## 2020-03-21 DIAGNOSIS — H903 Sensorineural hearing loss, bilateral: Secondary | ICD-10-CM | POA: Diagnosis not present

## 2020-03-21 DIAGNOSIS — H6983 Other specified disorders of Eustachian tube, bilateral: Secondary | ICD-10-CM | POA: Diagnosis not present

## 2020-04-25 DIAGNOSIS — Z23 Encounter for immunization: Secondary | ICD-10-CM | POA: Diagnosis not present

## 2020-05-22 ENCOUNTER — Other Ambulatory Visit (HOSPITAL_COMMUNITY): Payer: Self-pay | Admitting: Internal Medicine

## 2020-05-30 ENCOUNTER — Other Ambulatory Visit: Payer: Self-pay

## 2020-05-30 ENCOUNTER — Ambulatory Visit (INDEPENDENT_AMBULATORY_CARE_PROVIDER_SITE_OTHER): Payer: Medicare Other | Admitting: Dermatology

## 2020-05-30 DIAGNOSIS — D044 Carcinoma in situ of skin of scalp and neck: Secondary | ICD-10-CM

## 2020-05-30 DIAGNOSIS — L570 Actinic keratosis: Secondary | ICD-10-CM

## 2020-05-30 DIAGNOSIS — Z85828 Personal history of other malignant neoplasm of skin: Secondary | ICD-10-CM

## 2020-05-30 DIAGNOSIS — D099 Carcinoma in situ, unspecified: Secondary | ICD-10-CM

## 2020-05-30 MED ORDER — IMIQUIMOD 5 % EX CREA: TOPICAL_CREAM | Freq: Every day | CUTANEOUS | 0 refills | Status: DC

## 2020-05-30 NOTE — Patient Instructions (Addendum)
Follow up visit for Barbara Schwartz date of birth [birth year].  She continues to develop crusts  both in areas of old treatment like the crown of the scalp as well as several new areas.  The five thicker spots are two on the crown of the scalp, one on the left front thigh, and two on the left lower shin.  Each of these was treated with a 6 to 8-second liquid nitrogen freeze.  She will test a small normal-looking area beyond one elbow crease for five nights with a little application of generic Aldara ointment.  If there is no reaction, she will first try treating the spot on the left front thigh on Monday plus Wednesday plus Friday (three times weekly) for 3 to 4 weeks.  If there is no vigorous reaction, I would have her apply the imiquimod to the two spots on the left lower shin, the two spots on the crown of the scalp, and one spot on the right wrist if there is any residual after freezing.  Additionally, freezing done to several smaller crusts on the right leg and right arm and outside the right eye;  these will not be treated with Aldara after freezing.  Please call me or contact me via MyChart in 1 week to let me know what happens with the test area on normal skin and again 3 weeks later to let me know how you are tolerating the treatment on the left front thigh.

## 2020-05-31 ENCOUNTER — Other Ambulatory Visit (HOSPITAL_COMMUNITY): Payer: Self-pay | Admitting: *Deleted

## 2020-05-31 MED ORDER — ISOSORBIDE MONONITRATE ER 30 MG PO TB24: 15.0000 mg | ORAL_TABLET | Freq: Every day | ORAL | 0 refills | Status: DC

## 2020-05-31 MED ORDER — ROSUVASTATIN CALCIUM 20 MG PO TABS: 20.0000 mg | ORAL_TABLET | Freq: Every day | ORAL | 0 refills | Status: DC

## 2020-06-06 ENCOUNTER — Encounter: Payer: Self-pay | Admitting: Dermatology

## 2020-06-13 ENCOUNTER — Encounter: Payer: Self-pay | Admitting: Dermatology

## 2020-06-13 NOTE — Progress Notes (Signed)
   Follow-Up Visit   Subjective  Barbara Schwartz is a 74 y.o. female who presents for the following: Follow-up (CIS on right lower leg and left lower leg.).  Multiple new crusts Location: Thickest on scalp and left leg Duration:  Quality:  Associated Signs/Symptoms: Modifying Factors:  Severity:  Timing: Context: History of multiple nonmelanoma skin cancers.  Objective  Well appearing patient in no apparent distress; mood and affect are within normal limits.  A focused examination was performed including , Neck, arms, legs.. Relevant physical exam findings are noted in the Assessment and Plan.   Assessment & Plan    AK (actinic keratosis) (6) Left Lower Leg - Anterior; Left Lower Leg - Posterior; Right Lower Leg - Posterior; Right Dorsal Hand; Mid Parietal Scalp; Right Malar Cheek  Destruction of lesion - Left Lower Leg - Anterior, Left Lower Leg - Posterior, Mid Parietal Scalp, Right Dorsal Hand, Right Lower Leg - Posterior, Right Malar Cheek Complexity: simple   Destruction method: cryotherapy   Informed consent: discussed and consent obtained   Timeout:  patient name, date of birth, surgical site, and procedure verified Lesion destroyed using liquid nitrogen: Yes   Cryotherapy cycles:  5 Outcome: patient tolerated procedure well with no complications   Post-procedure details: wound care instructions given    Ordered Medications: imiquimod (ALDARA) 5 % cream  Squamous cell carcinoma in situ Mid Occipital Scalp  Destruction of lesion  8-second freeze for each spots to be followed by trial with topical Aldara.  If this is tolerated on the spot, goal would be a total of 24 Aldara applications 3-5 times per week or until there is a brisk local reaction.  Follow up visit for Barbara Schwartz date of birth 08-11-1945.  She continues to develop crusts  both in areas of old treatment like the crown of the scalp as well as several new areas.  The five thicker spots are  two on the crown of the scalp, one on the left front thigh, and two on the left lower shin.  Each of these was treated with a 6 to 8-second liquid nitrogen freeze.  She will test a small normal-looking area beyond one elbow crease for five nights with a little application of generic Aldara ointment.  If there is no reaction, she will first try treating the spot on the left front thigh on Monday plus Wednesday plus Friday (three times weekly) for 3 to 4 weeks.  If there is no vigorous reaction, I would have her apply the imiquimod to the two spots on the left lower shin, the two spots on the crown of the scalp, and one spot on the right wrist if there is any residual after freezing.  Additionally, freezing done to several smaller crusts on the right leg and right arm and outside the right eye;  these will not be treated with Aldara after freezing.  Please call me or contact me via MyChart in 1 week to let me know what happens with the test area on normal skin and again 3 weeks later to let me know how you are tolerating the treatment on the left front thigh.   I, Barbara Monarch, MD, have reviewed all documentation for this visit.  The documentation on 06/13/20 for the exam, diagnosis, procedures, and orders are all accurate and complete.

## 2020-07-05 DIAGNOSIS — I2510 Atherosclerotic heart disease of native coronary artery without angina pectoris: Secondary | ICD-10-CM | POA: Diagnosis not present

## 2020-07-05 DIAGNOSIS — Z8543 Personal history of malignant neoplasm of ovary: Secondary | ICD-10-CM | POA: Diagnosis not present

## 2020-07-05 DIAGNOSIS — C44719 Basal cell carcinoma of skin of left lower limb, including hip: Secondary | ICD-10-CM | POA: Diagnosis not present

## 2020-07-05 DIAGNOSIS — E785 Hyperlipidemia, unspecified: Secondary | ICD-10-CM | POA: Diagnosis not present

## 2020-08-30 ENCOUNTER — Encounter: Payer: Self-pay | Admitting: Dermatology

## 2020-08-30 ENCOUNTER — Ambulatory Visit (INDEPENDENT_AMBULATORY_CARE_PROVIDER_SITE_OTHER): Payer: Medicare Other | Admitting: Dermatology

## 2020-08-30 ENCOUNTER — Other Ambulatory Visit: Payer: Self-pay

## 2020-08-30 DIAGNOSIS — D485 Neoplasm of uncertain behavior of skin: Secondary | ICD-10-CM

## 2020-08-30 DIAGNOSIS — Z85828 Personal history of other malignant neoplasm of skin: Secondary | ICD-10-CM

## 2020-08-30 DIAGNOSIS — D044 Carcinoma in situ of skin of scalp and neck: Secondary | ICD-10-CM | POA: Diagnosis not present

## 2020-08-30 DIAGNOSIS — L570 Actinic keratosis: Secondary | ICD-10-CM | POA: Diagnosis not present

## 2020-08-30 NOTE — Patient Instructions (Signed)

## 2020-09-08 ENCOUNTER — Telehealth: Payer: Self-pay

## 2020-09-08 NOTE — Telephone Encounter (Signed)
Path to patient she already has a follow up in the computer

## 2020-09-09 ENCOUNTER — Encounter: Payer: Self-pay | Admitting: Dermatology

## 2020-09-09 NOTE — Progress Notes (Signed)
Follow-Up Visit   Subjective  Barbara Schwartz is a 75 y.o. female who presents for the following: Follow-up (Scalp still has crust all other spots better, patient was afraid to treat scalp with aldara due to brisk reaction on chest).  Notable skin cancers, legs did quite well with Aldara but patient was reluctant to use on scalp (history of severe reaction with Aldara on chest) Location:  Duration:  Quality:  Associated Signs/Symptoms: Modifying Factors:  Severity:  Timing: Context:   Objective  Well appearing patient in no apparent distress; mood and affect are within normal limits. Objective  Right Lower Eyelid: Previously frozen, persistent 3 mm cutaneous horn  Objective  Superior Parietal Scalp: 9 mm hornlike crust; 12.5cm to left upper ear junction     Objective  Inferior Parietal Scalp: 1.2 cm hornlike crust; 12.9cm to left upper ear junction     Objective  Mid Parietal Scalp: 1.2 cm hornlike crust; 15.0cm to left upper ear junction     Objective  Left Lower Leg - Anterior, Right Lower Leg - Anterior: Both sites with carcinoma in situ on legs did beautifully with Aldara, smooth and virtually scar free with some hyperpigmentation.    A focused examination was performed including Head, neck, arms, legs.. Relevant physical exam findings are noted in the Assessment and Plan.   Assessment & Plan    AK (actinic keratosis) Right Lower Eyelid  Destruction of lesion - Right Lower Eyelid Complexity: simple   Destruction method: cryotherapy   Informed consent: discussed and consent obtained   Timeout:  patient name, date of birth, surgical site, and procedure verified Lesion destroyed using liquid nitrogen: Yes   Cryotherapy cycles:  5 Outcome: patient tolerated procedure well with no complications   Post-procedure details: wound care instructions given    Other Related Medications imiquimod (ALDARA) 5 % cream  Carcinoma in situ of skin of scalp  and neck (3) Superior Parietal Scalp  Skin / nail biopsy Type of biopsy: tangential   Informed consent: discussed and consent obtained   Timeout: patient name, date of birth, surgical site, and procedure verified   Procedure prep:  Patient was prepped and draped in usual sterile fashion (Non sterile) Prep type:  Chlorhexidine Anesthesia: the lesion was anesthetized in a standard fashion   Anesthetic:  1% lidocaine w/ epinephrine 1-100,000 local infiltration Instrument used: flexible razor blade   Outcome: patient tolerated procedure well   Post-procedure details: wound care instructions given    Destruction of lesion Complexity: simple   Destruction method: electrodesiccation and curettage   Informed consent: discussed and consent obtained   Timeout:  patient name, date of birth, surgical site, and procedure verified Anesthesia: the lesion was anesthetized in a standard fashion   Anesthetic:  1% lidocaine w/ epinephrine 1-100,000 local infiltration Curettage performed in three different directions: Yes   Electrodesiccation performed over the curetted area: Yes   Curettage cycles:  2 Lesion length (cm):  1 Lesion width (cm):  1 Margin per side (cm):  0 Final wound size (cm):  1 Hemostasis achieved with:  ferric subsulfate and electrodesiccation Outcome: patient tolerated procedure well with no complications   Post-procedure details: sterile dressing applied and wound care instructions given   Dressing type: petrolatum    Specimen 1 - Surgical pathology Differential Diagnosis: R/O BCC vs SCC  Check Margins: No  Treated after biopsy  Inferior Parietal Scalp  Skin / nail biopsy Type of biopsy: tangential   Informed consent: discussed and consent obtained  Timeout: patient name, date of birth, surgical site, and procedure verified   Procedure prep:  Patient was prepped and draped in usual sterile fashion (Non sterile) Prep type:  Chlorhexidine Anesthesia: the lesion was  anesthetized in a standard fashion   Anesthetic:  1% lidocaine w/ epinephrine 1-100,000 local infiltration Instrument used: flexible razor blade   Hemostasis achieved with: ferric subsulfate   Outcome: patient tolerated procedure well   Post-procedure details: wound care instructions given    Destruction of lesion Complexity: simple   Destruction method: electrodesiccation and curettage   Informed consent: discussed and consent obtained   Timeout:  patient name, date of birth, surgical site, and procedure verified Anesthesia: the lesion was anesthetized in a standard fashion   Anesthetic:  1% lidocaine w/ epinephrine 1-100,000 local infiltration Curettage performed in three different directions: Yes   Electrodesiccation performed over the curetted area: Yes   Curettage cycles:  2 Lesion length (cm):  1.2 Lesion width (cm):  1.2 Margin per side (cm):  0 Final wound size (cm):  1.2 Hemostasis achieved with:  ferric subsulfate and electrodesiccation Outcome: patient tolerated procedure well with no complications   Post-procedure details: sterile dressing applied and wound care instructions given   Dressing type: petrolatum    Specimen 2 - Surgical pathology Differential Diagnosis: R/O BCC vs SCC  Check Margins: No  Treated after biopsy  Mid Parietal Scalp  Skin / nail biopsy Type of biopsy: tangential   Informed consent: discussed and consent obtained   Timeout: patient name, date of birth, surgical site, and procedure verified   Procedure prep:  Patient was prepped and draped in usual sterile fashion (Non sterile) Prep type:  Chlorhexidine Anesthesia: the lesion was anesthetized in a standard fashion   Anesthetic:  1% lidocaine w/ epinephrine 1-100,000 local infiltration Instrument used: flexible razor blade   Outcome: patient tolerated procedure well   Post-procedure details: wound care instructions given    Destruction of lesion Complexity: simple   Destruction  method: electrodesiccation and curettage   Informed consent: discussed and consent obtained   Timeout:  patient name, date of birth, surgical site, and procedure verified Anesthesia: the lesion was anesthetized in a standard fashion   Anesthetic:  1% lidocaine w/ epinephrine 1-100,000 local infiltration Curettage performed in three different directions: Yes   Electrodesiccation performed over the curetted area: Yes   Curettage cycles:  2 Lesion length (cm):  1.3 Lesion width (cm):  1.3 Margin per side (cm):  0 Final wound size (cm):  1.3 Hemostasis achieved with:  ferric subsulfate and electrodesiccation Outcome: patient tolerated procedure well with no complications   Post-procedure details: sterile dressing applied and wound care instructions given   Dressing type: petrolatum    Specimen 3 - Surgical pathology Differential Diagnosis: R/O BCC vs SCC  Check Margins: No  Treated after biopsy  Personal history of skin cancer (2) Left Lower Leg - Anterior; Right Lower Leg - Anterior  Recheck as needed clinical change      I, [name], MD, have reviewed all documentation for this visit.  The documentation on 09/09/20 for the exam, diagnosis, procedures, and orders are all accurate and complete.
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
## 2020-09-21 ENCOUNTER — Ambulatory Visit (HOSPITAL_COMMUNITY)
Admission: RE | Admit: 2020-09-21 | Discharge: 2020-09-21 | Disposition: A | Discharge status: Home / Self Care | Payer: Medicare Other | Source: Ambulatory Visit | Attending: Internal Medicine | Admitting: Internal Medicine

## 2020-09-21 ENCOUNTER — Encounter (HOSPITAL_COMMUNITY): Payer: Self-pay | Admitting: Internal Medicine

## 2020-09-21 ENCOUNTER — Other Ambulatory Visit: Payer: Self-pay

## 2020-09-21 VITALS — BP 100/62 | HR 58 | Wt 116.6 lb

## 2020-09-21 DIAGNOSIS — I2510 Atherosclerotic heart disease of native coronary artery without angina pectoris: Secondary | ICD-10-CM | POA: Insufficient documentation

## 2020-09-21 DIAGNOSIS — R0609 Other forms of dyspnea: Secondary | ICD-10-CM

## 2020-09-21 DIAGNOSIS — Z7982 Long term (current) use of aspirin: Secondary | ICD-10-CM | POA: Diagnosis not present

## 2020-09-21 DIAGNOSIS — I5022 Chronic systolic (congestive) heart failure: Secondary | ICD-10-CM | POA: Insufficient documentation

## 2020-09-21 DIAGNOSIS — R0789 Other chest pain: Secondary | ICD-10-CM | POA: Diagnosis not present

## 2020-09-21 DIAGNOSIS — Z8543 Personal history of malignant neoplasm of ovary: Secondary | ICD-10-CM | POA: Insufficient documentation

## 2020-09-21 DIAGNOSIS — Z8249 Family history of ischemic heart disease and other diseases of the circulatory system: Secondary | ICD-10-CM | POA: Diagnosis not present

## 2020-09-21 DIAGNOSIS — Z87891 Personal history of nicotine dependence: Secondary | ICD-10-CM | POA: Insufficient documentation

## 2020-09-21 DIAGNOSIS — J479 Bronchiectasis, uncomplicated: Secondary | ICD-10-CM | POA: Diagnosis not present

## 2020-09-21 DIAGNOSIS — R0600 Dyspnea, unspecified: Secondary | ICD-10-CM | POA: Diagnosis not present

## 2020-09-21 HISTORY — DX: Heart failure, unspecified: I50.9

## 2020-09-21 MED ORDER — ISOSORBIDE MONONITRATE ER 30 MG PO TB24: 15.0000 mg | ORAL_TABLET | Freq: Every day | ORAL | 3 refills | Status: DC

## 2020-09-21 MED ORDER — ROSUVASTATIN CALCIUM 20 MG PO TABS: 20.0000 mg | ORAL_TABLET | Freq: Every day | ORAL | 3 refills | Status: DC

## 2020-09-21 NOTE — Patient Instructions (Signed)
It was great to see you today! No medication changes are needed at this time.  Your physician has requested that you have an echocardiogram. Echocardiography is a painless test that uses sound waves to create images of your heart. It provides your doctor with information about the size and shape of your heart and how well your heart's chambers and valves are working. This procedure takes approximately one hour. There are no restrictions for this procedure.  Your physician has recommended that you have a cardiopulmonary stress test (CPX). CPX testing is a non-invasive measurement of heart and lung function. It replaces a traditional treadmill stress test. This type of test provides a tremendous amount of information that relates not only to your present condition but also for future outcomes. This test combines measurements of you ventilation, respiratory gas exchange in the lungs, electrocardiogram (EKG), blood pressure and physical response before, during, and following an exercise protocol.  Your physician recommends that you schedule a follow-up appointment in: 6 months with Dr Haroldine Laws  At the [facility], you and your health needs are our priority. As part of our continuing mission to provide you with exceptional heart care, we have created designated Provider Care Teams. These Care Teams include your primary Cardiologist (physician) and Advanced Practice Providers (APPs- Physician Assistants and Nurse Practitioners) who all work together to provide you with the care you need, when you need it.   You may see any of the following providers on your designated Care Team at your next follow up: Marland Kitchen Dr Glori Bickers . Dr Loralie Champagne . Dr Vickki Muff . Darrick Grinder, NP . Lyda Jester, [place] . Audry Riles, PharmD   Please be sure to bring in all your medications bottles to every appointment.   If you have any questions or concerns before your next  appointment please send [place] a message through [place] or call our office at [phone].    TO LEAVE A MESSAGE FOR THE NURSE SELECT OPTION 2, PLEASE LEAVE A MESSAGE INCLUDING: . YOUR NAME . DATE OF BIRTH . CALL BACK NUMBER . REASON FOR CALL**this is important as we prioritize the call backs  YOU WILL RECEIVE A CALL BACK THE SAME DAY AS LONG AS YOU CALL BEFORE 4:00 PM

## 2020-10-24 ENCOUNTER — Other Ambulatory Visit: Payer: Self-pay

## 2020-10-24 ENCOUNTER — Ambulatory Visit (HOSPITAL_COMMUNITY): Payer: Medicare Other

## 2020-10-24 ENCOUNTER — Ambulatory Visit (HOSPITAL_COMMUNITY)
Admission: RE | Admit: 2020-10-24 | Discharge: 2020-10-24 | Disposition: A | Discharge status: Home / Self Care | Payer: Medicare Other | Source: Ambulatory Visit | Attending: Internal Medicine | Admitting: Internal Medicine

## 2020-10-24 ENCOUNTER — Other Ambulatory Visit (HOSPITAL_COMMUNITY): Payer: Self-pay | Admitting: *Deleted

## 2020-10-24 DIAGNOSIS — R0600 Dyspnea, unspecified: Secondary | ICD-10-CM | POA: Insufficient documentation

## 2020-10-24 DIAGNOSIS — I5022 Chronic systolic (congestive) heart failure: Secondary | ICD-10-CM

## 2020-10-24 DIAGNOSIS — I2510 Atherosclerotic heart disease of native coronary artery without angina pectoris: Secondary | ICD-10-CM | POA: Insufficient documentation

## 2020-10-24 LAB — ECHOCARDIOGRAM COMPLETE
Area-P 1/2: 4.15 cm2
S' Lateral: 2.2 cm
Single Plane A4C EF: 57 %

## 2020-10-24 NOTE — Progress Notes (Signed)
  Echocardiogram 2D Echocardiogram has been performed.  Barbara Schwartz 10/24/2020, 9:47 AM

## 2020-10-28 DIAGNOSIS — Z23 Encounter for immunization: Secondary | ICD-10-CM | POA: Diagnosis not present

## 2020-11-01 ENCOUNTER — Encounter: Payer: Self-pay | Admitting: Dermatology

## 2020-11-01 ENCOUNTER — Other Ambulatory Visit: Payer: Self-pay

## 2020-11-01 ENCOUNTER — Ambulatory Visit (INDEPENDENT_AMBULATORY_CARE_PROVIDER_SITE_OTHER): Payer: Medicare Other | Admitting: Dermatology

## 2020-11-01 DIAGNOSIS — L821 Other seborrheic keratosis: Secondary | ICD-10-CM

## 2020-11-01 DIAGNOSIS — Z1283 Encounter for screening for malignant neoplasm of skin: Secondary | ICD-10-CM

## 2020-11-01 DIAGNOSIS — L570 Actinic keratosis: Secondary | ICD-10-CM

## 2020-11-01 DIAGNOSIS — Z85828 Personal history of other malignant neoplasm of skin: Secondary | ICD-10-CM | POA: Diagnosis not present

## 2020-11-01 DIAGNOSIS — I2510 Atherosclerotic heart disease of native coronary artery without angina pectoris: Secondary | ICD-10-CM

## 2020-11-01 DIAGNOSIS — Z8589 Personal history of malignant neoplasm of other organs and systems: Secondary | ICD-10-CM

## 2020-11-01 NOTE — Patient Instructions (Addendum)
Apply aldara on spots on scalp 2 times a day. If no reaction and hits 20, then stop.

## 2020-11-11 ENCOUNTER — Encounter: Payer: Self-pay | Admitting: Dermatology

## 2020-11-11 NOTE — Progress Notes (Signed)
   Follow-Up Visit   Subjective  Barbara Schwartz is a 75 y.o. female who presents for the following: Follow-up (Follow up on scalp and neck carcinoma in situ. ).  Follow-up multiple superficial carcinoma as well as actinic keratoses Location:  Duration:  Quality:  Associated Signs/Symptoms: Modifying Factors:  Severity:  Timing: Context:   Objective  Well appearing patient in no apparent distress; mood and affect are within normal limits. Objective  Scalp: Scalp, arms, chest, and legs.  Objective  Left Temporal Scalp, Right Temporal Scalp:  Excellent overall improvement with use of Aldara.  Some residual not very thick crust.  Objective  Left Breast: 6 mm light brown flattopped textured papule    A focused examination was performed including Head, neck, arms, legs, chest.. Relevant physical exam findings are noted in the Assessment and Plan.   Assessment & Plan    History of basal cell carcinoma (BCC) Left Upper Back  Yearly skin check.  History of squamous cell carcinoma Right Malar Cheek  Yearly skin check.  Actinic keratosis Scalp  Per Dr. Denna Haggard, stable to leave.   Screening exam for skin cancer Scalp  Yearly skin check.  AK (actinic keratosis) (2) Left Temporal Scalp; Right Temporal Scalp  We will do 6 to 8-second LN2 freeze and Barbara Schwartz can decide whether she wishes to follow-up with Aldara in 1 month if there is any residual scale.  Destruction of lesion - Left Temporal Scalp, Right Temporal Scalp Complexity: simple   Destruction method: cryotherapy   Informed consent: discussed and consent obtained   Timeout:  patient name, date of birth, surgical site, and procedure verified Lesion destroyed using liquid nitrogen: Yes   Cryotherapy cycles:  5 Outcome: patient tolerated procedure well with no complications   Post-procedure details: wound care instructions given    Seborrheic keratosis Left Breast  Leave if stable.      I,  Barbara Monarch, MD, have reviewed all documentation for this visit.  The documentation on 11/11/20 for the exam, diagnosis, procedures, and orders are all accurate and complete.

## 2020-12-06 ENCOUNTER — Other Ambulatory Visit: Payer: Self-pay | Admitting: Obstetrics & Gynecology

## 2020-12-06 DIAGNOSIS — Z1231 Encounter for screening mammogram for malignant neoplasm of breast: Secondary | ICD-10-CM

## 2020-12-20 DIAGNOSIS — Z20822 Contact with and (suspected) exposure to covid-19: Secondary | ICD-10-CM | POA: Diagnosis not present

## 2020-12-23 ENCOUNTER — Other Ambulatory Visit: Payer: Self-pay

## 2020-12-23 ENCOUNTER — Encounter: Payer: Self-pay | Admitting: Podiatrist

## 2020-12-23 ENCOUNTER — Ambulatory Visit (INDEPENDENT_AMBULATORY_CARE_PROVIDER_SITE_OTHER): Payer: Medicare Other | Admitting: Podiatrist

## 2020-12-23 DIAGNOSIS — L84 Corns and callosities: Secondary | ICD-10-CM

## 2020-12-23 DIAGNOSIS — I2510 Atherosclerotic heart disease of native coronary artery without angina pectoris: Secondary | ICD-10-CM

## 2020-12-23 NOTE — Progress Notes (Signed)
  No chief complaint on file.    HPI: Patient is 75 y.o. female who presents today for pain on the plantar aspect of the left foot.  She is active and states she has what she believes is a callus on her foot that is painful.  She denies any drainage from the lesion.  Denies any redness or swelling in the foot.   Patient Active Problem List   Diagnosis Date Noted  . Hyperlipidemia 08/06/2018  . Coronary artery disease with exertional angina ([place]) 07/11/2017  . CAD (coronary artery disease) 07/11/2017  . Dyspnea 05/01/2016  . Solitary pulmonary nodule 05/02/2015  . Adverse effects of medication 02/14/2015  . Cough 01/28/2015  . Bronchiectasis without acute exacerbation ([place]) 01/28/2015  . Shadow 01/25/2015  . Ovarian ca ([place])   . Osteopenia     Current Outpatient Medications on File Prior to Visit  Medication Sig Dispense Refill  . aspirin EC 81 MG tablet Take 81 mg by mouth daily.    . Calcium Carb-Cholecalciferol (CALCIUM 600+D3 PO) Take 1 tablet by mouth 2 (two) times daily.    Marland Kitchen estradiol (ESTRING) 2 MG vaginal ring Place 2 mg vaginally every 3 (three) months. follow package directions 1 each 3  . ibuprofen (ADVIL,MOTRIN) 200 MG tablet Take 200-600 mg by mouth every 8 (eight) hours as needed (for pain).    . isosorbide mononitrate (IMDUR) 30 MG 24 hr tablet Take 0.5 tablets (15 mg total) by mouth daily. 45 tablet 3  . rosuvastatin (CRESTOR) 20 MG tablet Take 1 tablet (20 mg total) by mouth at bedtime. 90 tablet 3   No current facility-administered medications on file prior to visit.    Allergies  Allergen Reactions  . Albuterol Palpitations  . Xopenex [Levalbuterol Hcl] Palpitations    Pt with extreme nervousness , shakes and weakness after breathing treatment.     Review of Systems No fevers, chills, nausea, muscle aches, no difficulty breathing, no calf pain, no chest pain or shortness of breath.   Physical Exam  GENERAL APPEARANCE: Alert, conversant. Appropriately  groomed. No acute distress.   VASCULAR: Pedal pulses palpable DP and PT bilateral.  Capillary refill time is immediate to all digits,  Proximal to distal cooling it warm to warm.  Digital perfusion adequate.   NEUROLOGIC: sensation is intact to 5.07 monofilament at 5/5 sites bilateral.  Light touch is intact bilateral, vibratory sensation intact bilateral  MUSCULOSKELETAL: acceptable muscle strength, tone and stability bilateral.  No gross boney pedal deformities noted.  No pain, crepitus or limitation noted with foot and ankle range of motion bilateral.   DERMATOLOGIC: skin is warm, supple, and dry.  No open lesions noted.  No rash, no pre ulcerative lesions. Digital nails are asymptomatic.  Well circumscribed porokeratotic/ callus lesion present submet 4 of the left foot.  Ground glass appearance noted with skin tension lines throughout.  Pain with compression noted.     Assessment     ICD-10-CM   1. Callus of foot  L84      Plan  Pared the callus tissue down with a 15 blade without complication.  She will return as needed for follow up

## 2020-12-23 NOTE — Patient Instructions (Signed)
Corns and Calluses Corns are small areas of thickened skin that form on the top, sides, or tip of a toe. Corns have a cone-shaped core with a point that can press on a nerve below. This causes pain. Calluses are areas of thickened skin that can form anywhere on the body, including the hands, fingers, palms, soles of the feet, and heels. Calluses are usually larger than corns. What are the causes? Corns and calluses are caused by rubbing (friction) or pressure, such as from shoes that are too tight or do not fit properly. What increases the risk? Corns are more likely to develop in people who have misshapen toes (toe deformities), such as hammer toes. Calluses can form with friction to any area of the skin. They are more likely to develop in people who:  Work with their hands.  Wear shoes that fit poorly, are too tight, or are high-heeled.  Have toe deformities. What are the signs or symptoms? Symptoms of a corn or callus include:  A hard growth on the skin.  Pain or tenderness under the skin.  Redness and swelling.  Increased discomfort while wearing tight-fitting shoes, if your feet are affected. If a corn or callus becomes infected, symptoms may include:  Redness and swelling that gets worse.  Pain.  Fluid, blood, or pus draining from the corn or callus.   How is this diagnosed? Corns and calluses may be diagnosed based on your symptoms, your medical history, and a physical exam. How is this treated? Treatment for corns and calluses may include:  Removing the cause of the friction or pressure. This may involve: ? Changing your shoes. ? Wearing shoe inserts (orthotics) or other protective layers in your shoes, such as a corn pad. ? Wearing gloves.  Applying medicine to the skin (topical medicine) to help soften skin in the hardened, thickened areas.  Removing layers of dead skin with a file to reduce the size of the corn or callus.  Removing the corn or callus with a  scalpel or laser.  Taking antibiotic medicines, if your corn or callus is infected.  Having surgery, if a toe deformity is the cause. Follow these instructions at home:  Take over-the-counter and prescription medicines only as told by your health care provider.  If you were prescribed an antibiotic medicine, take it as told by your health care provider. Do not stop taking it even if your condition improves.  Wear shoes that fit well. Avoid wearing high-heeled shoes and shoes that are too tight or too loose.  Wear any padding, protective layers, gloves, or orthotics as told by your health care provider.  Soak your hands or feet. Then use a file or pumice stone to soften your corn or callus. Do this as told by your health care provider.  Check your corn or callus every day for signs of infection.   Contact a health care provider if:  Your symptoms do not improve with treatment.  You have redness or swelling that gets worse.  Your corn or callus becomes painful.  You have fluid, blood, or pus coming from your corn or callus.  You have new symptoms. Get help right away if:  You develop severe pain with redness. Summary  Corns are small areas of thickened skin that form on the top, sides, or tip of a toe. These can be painful.  Calluses are areas of thickened skin that can form anywhere on the body, including the hands, fingers, palms, and soles of the   feet. Calluses are usually larger than corns.  Corns and calluses are caused by rubbing (friction) or pressure, such as from shoes that are too tight or do not fit properly.  Treatment may include wearing padding, protective layers, gloves, or orthotics as told by your health care provider. This information is not intended to replace advice given to you by your health care provider. Make sure you discuss any questions you have with your health care provider. Document Revised: 11/05/2019 Document Reviewed: 11/05/2019 Elsevier  Patient Education  2021 Elsevier Inc.  

## 2021-01-10 DIAGNOSIS — Z8543 Personal history of malignant neoplasm of ovary: Secondary | ICD-10-CM | POA: Diagnosis not present

## 2021-01-10 DIAGNOSIS — E559 Vitamin D deficiency, unspecified: Secondary | ICD-10-CM | POA: Diagnosis not present

## 2021-01-10 DIAGNOSIS — Z0000 Encounter for general adult medical examination without abnormal findings: Secondary | ICD-10-CM | POA: Diagnosis not present

## 2021-01-10 DIAGNOSIS — C4492 Squamous cell carcinoma of skin, unspecified: Secondary | ICD-10-CM | POA: Diagnosis not present

## 2021-01-10 DIAGNOSIS — I2510 Atherosclerotic heart disease of native coronary artery without angina pectoris: Secondary | ICD-10-CM | POA: Diagnosis not present

## 2021-01-10 DIAGNOSIS — Z90710 Acquired absence of both cervix and uterus: Secondary | ICD-10-CM | POA: Diagnosis not present

## 2021-01-10 DIAGNOSIS — E785 Hyperlipidemia, unspecified: Secondary | ICD-10-CM | POA: Diagnosis not present

## 2021-01-10 DIAGNOSIS — D500 Iron deficiency anemia secondary to blood loss (chronic): Secondary | ICD-10-CM | POA: Diagnosis not present

## 2021-01-10 DIAGNOSIS — H9190 Unspecified hearing loss, unspecified ear: Secondary | ICD-10-CM | POA: Diagnosis not present

## 2021-01-10 DIAGNOSIS — C44719 Basal cell carcinoma of skin of left lower limb, including hip: Secondary | ICD-10-CM | POA: Diagnosis not present

## 2021-01-10 DIAGNOSIS — Z86010 Personal history of colonic polyps: Secondary | ICD-10-CM | POA: Diagnosis not present

## 2021-01-10 DIAGNOSIS — R911 Solitary pulmonary nodule: Secondary | ICD-10-CM | POA: Diagnosis not present

## 2021-01-10 DIAGNOSIS — R296 Repeated falls: Secondary | ICD-10-CM | POA: Diagnosis not present

## 2021-01-10 DIAGNOSIS — Z1389 Encounter for screening for other disorder: Secondary | ICD-10-CM | POA: Diagnosis not present

## 2021-01-26 ENCOUNTER — Encounter: Payer: Medicare Other | Admitting: Obstetrics and Gynecology

## 2021-01-26 ENCOUNTER — Ambulatory Visit (INDEPENDENT_AMBULATORY_CARE_PROVIDER_SITE_OTHER): Payer: Medicare Other | Admitting: Obstetrics & Gynecology

## 2021-01-26 ENCOUNTER — Encounter: Payer: Self-pay | Admitting: Obstetrics & Gynecology

## 2021-01-26 ENCOUNTER — Other Ambulatory Visit: Payer: Self-pay

## 2021-01-26 VITALS — BP 110/64 | HR 49 | Resp 14 | Ht 63.75 in | Wt 115.0 lb

## 2021-01-26 DIAGNOSIS — Z780 Asymptomatic menopausal state: Secondary | ICD-10-CM | POA: Diagnosis not present

## 2021-01-26 DIAGNOSIS — N952 Postmenopausal atrophic vaginitis: Secondary | ICD-10-CM

## 2021-01-26 DIAGNOSIS — Z01419 Encounter for gynecological examination (general) (routine) without abnormal findings: Secondary | ICD-10-CM

## 2021-01-26 DIAGNOSIS — M8580 Other specified disorders of bone density and structure, unspecified site: Secondary | ICD-10-CM | POA: Diagnosis not present

## 2021-01-26 DIAGNOSIS — Z8543 Personal history of malignant neoplasm of ovary: Secondary | ICD-10-CM

## 2021-01-26 MED ORDER — ESTRADIOL 2 MG VA RING: 2.0000 mg | VAGINAL_RING | VAGINAL | 4 refills | Status: DC

## 2021-01-26 NOTE — Progress Notes (Signed)
Barbara Schwartz 1946-05-02 [id number]   History:    75 y.o. G2P1A1L1  RP:  Established patient presenting for annual gyn exam   HPI:  Postmenopausal/vaginal atrophy. Continues on Estring 2 mg for vaginal atrophy and decreased UTI recurrence. Prior TAH/BSO 1998 for ovarian cancer. History of stage IIIC ovarian cancer. She is followed with an annual CA-125.  She was apparently never genetically tested and she is not interested in pursuing this. Pap smear 2017.  No significant history of abnormal Pap smears.  Breasts normal.  Mammogram 12/2019.  Positive family history of breast cancer.  Will do a 3D screening mammo.  Colonoscopy 2020.  Osteopenia.  DEXA 02/2019.  T score -1.9, BMD stable, FRAX 18% / 9.6%.  BMI 19.89.  Plays tennis.  Health labs with Fam MD.  Past medical history,surgical history, family history and social history were all reviewed and documented in the EPIC chart.  Gynecologic History No LMP recorded (lmp unknown). Patient has had a hysterectomy.  Obstetric History OB History  Gravida Para Term Preterm AB Living  2 1 1   1 1   SAB IAB Ectopic Multiple Live Births               # Outcome Date GA Lbr Len/2nd Weight Sex Delivery Anes PTL Lv  2 AB           1 Term              ROS: A ROS was performed and pertinent positives and negatives are included in the history.  GENERAL: No fevers or chills. HEENT: No change in vision, no earache, sore throat or sinus congestion. NECK: No pain or stiffness. CARDIOVASCULAR: No chest pain or pressure. No palpitations. PULMONARY: No shortness of breath, cough or wheeze. GASTROINTESTINAL: No abdominal pain, nausea, vomiting or diarrhea, melena or bright red blood per rectum. GENITOURINARY: No urinary frequency, urgency, hesitancy or dysuria. MUSCULOSKELETAL: No joint or muscle pain, no back pain, no recent trauma. DERMATOLOGIC: No rash, no itching, no lesions. ENDOCRINE: No polyuria, polydipsia, no heat or cold intolerance. No recent change  in weight. HEMATOLOGICAL: No anemia or easy bruising or bleeding. NEUROLOGIC: No headache, seizures, numbness, tingling or weakness. PSYCHIATRIC: No depression, no loss of interest in normal activity or change in sleep pattern.     Exam:   BP 110/64   Pulse (!) 49   Resp 14   Ht 5' 3.75" (1.619 m)   Wt 115 lb (52.2 kg)   LMP  (LMP Unknown)   BMI 19.89 kg/m   Body mass index is 19.89 kg/m.  General appearance : Well developed well nourished female. No acute distress HEENT: Eyes: no retinal hemorrhage or exudates,  Neck supple, trachea midline, no carotid bruits, no thyroidmegaly Lungs: Clear to auscultation, no rhonchi or wheezes, or rib retractions  Heart: Regular rate and rhythm, no murmurs or gallops Breast:Examined in sitting and supine position were symmetrical in appearance, no palpable masses or tenderness,  no skin retraction, no nipple inversion, no nipple discharge, no skin discoloration, no axillary or supraclavicular lymphadenopathy Abdomen: no palpable masses or tenderness, no rebound or guarding Extremities: no edema or skin discoloration or tenderness  Pelvic: Vulva: Normal             Vagina: No gross lesions or discharge  Cervix/Uterus absent  Adnexa  Without masses or tenderness  Anus: Normal   Assessment/Plan:  75 y.o. female for annual exam   1. Encounter for gynecological examination without abnormal  finding Gynecologic exam status post TAH/BSO.  No indication for Pap test at this time.  Breast exam normal.  Family history positive for breast cancer.  We will proceed with a 3D screening mammogram.  Colonoscopy 2015.  Good body mass index at 19.89.  Continue with fitness and healthy nutrition.  Playing tennis.  Health labs with family physician.  2. Postmenopause Well without systemic hormone replacement therapy. - DG Bone Density; Future  3. Postmenopausal atrophic vaginitis Will continue on Estring 2 mg every 3 months.  No contraindication to continue.   Prescription sent to pharmacy.  4. Osteopenia, unspecified location Will repeat a bone density in August 2022.  Continue with vitamin D supplements, calcium intake of 1.5 g/day total and regular weightbearing physical activities. - DG Bone Density; Future  5. History of ovarian cancer History of ovarian cancer status post TAH/BSO.  We will check a CA125 level today. - CA 125  Other orders - estradiol (ESTRING) 2 MG vaginal ring; Place 2 mg vaginally every 3 (three) months. follow package directions   Princess Bruins MD, 2:10 PM 01/26/2021

## 2021-01-27 LAB — CA 125: CA 125: 12 U/mL (ref <35)

## 2021-01-29 ENCOUNTER — Encounter: Payer: Self-pay | Admitting: Obstetrics & Gynecology

## 2021-02-01 ENCOUNTER — Ambulatory Visit
Admission: RE | Admit: 2021-02-01 | Discharge: 2021-02-01 | Disposition: A | Discharge status: Home / Self Care | Payer: Medicare Other | Source: Ambulatory Visit | Attending: Obstetrics & Gynecology | Admitting: Obstetrics & Gynecology

## 2021-02-01 ENCOUNTER — Other Ambulatory Visit: Payer: Self-pay

## 2021-02-01 DIAGNOSIS — Z1231 Encounter for screening mammogram for malignant neoplasm of breast: Secondary | ICD-10-CM | POA: Diagnosis not present

## 2021-02-19 DIAGNOSIS — U071 COVID-19: Secondary | ICD-10-CM | POA: Diagnosis not present

## 2021-02-20 DIAGNOSIS — M25551 Pain in right hip: Secondary | ICD-10-CM | POA: Diagnosis not present

## 2021-02-21 ENCOUNTER — Other Ambulatory Visit: Payer: Self-pay

## 2021-02-21 ENCOUNTER — Other Ambulatory Visit: Payer: Self-pay | Admitting: Obstetrics & Gynecology

## 2021-02-21 ENCOUNTER — Ambulatory Visit (INDEPENDENT_AMBULATORY_CARE_PROVIDER_SITE_OTHER): Payer: Medicare Other

## 2021-02-21 DIAGNOSIS — Z780 Asymptomatic menopausal state: Secondary | ICD-10-CM

## 2021-02-21 DIAGNOSIS — M8589 Other specified disorders of bone density and structure, multiple sites: Secondary | ICD-10-CM | POA: Diagnosis not present

## 2021-02-21 DIAGNOSIS — M8580 Other specified disorders of bone density and structure, unspecified site: Secondary | ICD-10-CM

## 2021-02-23 DIAGNOSIS — U071 COVID-19: Secondary | ICD-10-CM | POA: Diagnosis not present

## 2021-02-23 DIAGNOSIS — I2510 Atherosclerotic heart disease of native coronary artery without angina pectoris: Secondary | ICD-10-CM | POA: Diagnosis not present

## 2021-03-23 ENCOUNTER — Encounter: Payer: Self-pay | Admitting: Physical Therapy

## 2021-03-23 ENCOUNTER — Ambulatory Visit: Payer: Medicare Other | Attending: Orthopaedic Surgery | Admitting: Physical Therapy

## 2021-03-23 ENCOUNTER — Other Ambulatory Visit: Payer: Self-pay

## 2021-03-23 DIAGNOSIS — M62838 Other muscle spasm: Secondary | ICD-10-CM | POA: Diagnosis not present

## 2021-03-23 DIAGNOSIS — R293 Abnormal posture: Secondary | ICD-10-CM | POA: Diagnosis not present

## 2021-03-23 DIAGNOSIS — M533 Sacrococcygeal disorders, not elsewhere classified: Secondary | ICD-10-CM | POA: Insufficient documentation

## 2021-03-23 DIAGNOSIS — R2689 Other abnormalities of gait and mobility: Secondary | ICD-10-CM | POA: Insufficient documentation

## 2021-03-23 DIAGNOSIS — C439 Malignant melanoma of skin, unspecified: Secondary | ICD-10-CM

## 2021-03-23 NOTE — Patient Instructions (Addendum)
Open book ( hanout) to increase midback mobility   Sit with both feet on floor, not thigh crossed over thigh  To maintain equal alignment of pelvis    Avoid straining pelvic floor, abdominal muscles , spine  Use log rolling technique instead of getting out of bed with your neck or the sit-up     Log rolling into and out of bed   Log rolling into and out of bed If getting out of bed on R side, Bent knees, scoot hips/ shoulder to L  Raise R arm completely overhead, rolling onto armpit  Then lower bent knees to bed to get into complete side lying position  Then drop legs off bed, and push up onto R elbow/forearm, and use L hand to push onto the bed
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
## 2021-03-30 ENCOUNTER — Ambulatory Visit (HOSPITAL_COMMUNITY)
Admission: RE | Admit: 2021-03-30 | Discharge: 2021-03-30 | Disposition: A | Discharge status: Home / Self Care | Payer: Medicare Other | Source: Ambulatory Visit | Attending: Internal Medicine | Admitting: Internal Medicine

## 2021-03-30 ENCOUNTER — Other Ambulatory Visit: Payer: Self-pay

## 2021-03-30 ENCOUNTER — Encounter (HOSPITAL_COMMUNITY): Payer: Self-pay | Admitting: Internal Medicine

## 2021-03-30 VITALS — BP 130/78 | HR 47 | Wt 114.0 lb

## 2021-03-30 DIAGNOSIS — M8580 Other specified disorders of bone density and structure, unspecified site: Secondary | ICD-10-CM | POA: Insufficient documentation

## 2021-03-30 DIAGNOSIS — J479 Bronchiectasis, uncomplicated: Secondary | ICD-10-CM | POA: Insufficient documentation

## 2021-03-30 DIAGNOSIS — Z8249 Family history of ischemic heart disease and other diseases of the circulatory system: Secondary | ICD-10-CM | POA: Diagnosis not present

## 2021-03-30 DIAGNOSIS — Z87891 Personal history of nicotine dependence: Secondary | ICD-10-CM | POA: Insufficient documentation

## 2021-03-30 DIAGNOSIS — I509 Heart failure, unspecified: Secondary | ICD-10-CM | POA: Diagnosis not present

## 2021-03-30 DIAGNOSIS — I2510 Atherosclerotic heart disease of native coronary artery without angina pectoris: Secondary | ICD-10-CM | POA: Diagnosis not present

## 2021-03-30 DIAGNOSIS — R0600 Dyspnea, unspecified: Secondary | ICD-10-CM

## 2021-03-30 DIAGNOSIS — R001 Bradycardia, unspecified: Secondary | ICD-10-CM

## 2021-03-30 DIAGNOSIS — R0609 Other forms of dyspnea: Secondary | ICD-10-CM

## 2021-03-30 DIAGNOSIS — Z79899 Other long term (current) drug therapy: Secondary | ICD-10-CM | POA: Diagnosis not present

## 2021-03-30 DIAGNOSIS — Z8543 Personal history of malignant neoplasm of ovary: Secondary | ICD-10-CM | POA: Insufficient documentation

## 2021-03-30 DIAGNOSIS — Z7982 Long term (current) use of aspirin: Secondary | ICD-10-CM | POA: Insufficient documentation

## 2021-03-30 DIAGNOSIS — Z85828 Personal history of other malignant neoplasm of skin: Secondary | ICD-10-CM | POA: Insufficient documentation

## 2021-03-30 DIAGNOSIS — Z791 Long term (current) use of non-steroidal anti-inflammatories (NSAID): Secondary | ICD-10-CM | POA: Diagnosis not present

## 2021-03-30 NOTE — Progress Notes (Signed)
ReDS Vest / Clip - 03/30/21 1300       ReDS Vest / Clip   Station Marker A    Ruler Value 24    ReDS Value Range Low volume    ReDS Actual Value 25

## 2021-03-30 NOTE — Progress Notes (Signed)
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   Underwent CTA coronaries 12/18 and found to have borderline lesion in proximal LAD. Underwent subsequent right and left cath and flow wire interrogation by Dr. Burt Knack on 07/11/2017. Hemodynamics normal. Lesions in LAD and diagonal non-obstructive by FFR.  Had CPX 10/2020. Normal functional capacity.    Today she returns for HF follow up.Overall feeling fine. Chest tightness associated with stress. Denies SOB/PND/Orthopnea. Appetite ok. No fever or chills. Weight at home  pounds. Taking all medications.  CPX  Peak VO2: 28.6 (144% predicted peak VO2)  VE/VCO2 slope:  33  OUES: 1.58  Peak RER: 1.10  Ventilatory Threshold: 24.0 (121% predicted or 84% measured peak VO2)  Peak RR 41  Peak Ventilation:  63.9  VE/MVV:  67%  PETCO2 at peak:  30  O2pulse:  11   (157% predicted O2pulse)   Cath 12/18   1.  Moderate proximal LAD/first diagonal stenosis (60%), both lesions interrogated with FFR (0.92 LAD and 0.87 Diagonal). 2.  Widely patent left main, left circumflex, and right coronary arteries with minimal nonobstructive plaquing 3.  Normal right heart hemodynamics 4.  Normal LV systolic function with normal LVEDP     Past Medical History:  Diagnosis Date   Atypical nevus 01/05/1997   slight-mod- 3rd & 4th toe web Left foot -WS   BCC (basal cell carcinoma) 03/17/2004   Rigth lower inner leg (CX35Fu)   BCC (basal cell carcinoma) 12/03/2012   left upper back (CX35FU)   BCC (basal cell carcinoma) x 2 04/09/2006   right forearm (MOHS)  left scalp (MOHS)   Bowen's disease x 3 08/26/2015   Left calf medial- (CX35FU) , Left calf sup. (CX35FU), Left calf inf. (CX35FU)   bowens 05/05/1996   lower right shin/outer side (CX35FU)   CHF (congestive heart failure) (HCC)    Coronary artery disease    Dyspnea    with exercise   Nodular basal cell carcinoma (BCC) 07/04/2018   left side lower leg (MOHS)   Osteopenia 02/2019   T score -1.9 FRAX 18% / 9.6%   Ovarian cancer ([place]) 1990   Stage 3   SCC (squamous cell carcinoma) 08/17/2003   left ear (CX35FU)   SCC (squamous cell carcinoma) 03/17/2004   right ant. shin (CX35FU)   SCC (squamous cell carcinoma) 09/10/2017   left lower shin   SCC (squamous cell carcinoma) x 2 12/03/2012   left outer calf (CX35FU) right cheek (CX35FU)   SCC (squamous cell carcinoma) x 2 06/29/2014   right cheek, lateral (MOHS) Right cheek,medial (MOHS)   SCC (squamous cell carcinoma) x 3 07/04/2018   back of crown, inf, Left lower leg (CX35FU), Right lower calf (CX35FU)   Squamous cell carcinoma of skin 11/02/2019   in situ on right lower leg, posterior - CX3+5FU   Squamous cell carcinoma of skin 11/02/2019   in situ on left lower leg, posterior - CX3+5FU   Superficial basal cell carcinoma (BCC) 05/05/1996   left right shin inner side (CX35FU)   Superficial basal cell carcinoma (BCC) 08/24/2014  right submandibular (Dr Aundra Millet bx'd )   Past Surgical History:  Procedure Laterality Date   ABDOMINAL HYSTERECTOMY  1990   TAH BSO   Bowel obstruction     BREAST BIOPSY Right 11/27/2002   CARDIAC CATHETERIZATION  07/11/2017   INTRAVASCULAR PRESSURE WIRE/FFR STUDY N/A 07/11/2017   Procedure: INTRAVASCULAR PRESSURE WIRE/FFR STUDY;  Surgeon: Sherren Mocha, MD;  Location: [facility];  Service: Cardiovascular;  Laterality: N/A;   OOPHORECTOMY     BSO   RIGHT/LEFT HEART CATH AND CORONARY ANGIOGRAPHY N/A 07/11/2017   Procedure: RIGHT/LEFT HEART CATH AND CORONARY ANGIOGRAPHY;  Surgeon: Sherren Mocha, MD;  Location: [facility];  Service: Cardiovascular;  Laterality: N/A;   Squamous cell and Basal cell excision     VIDEO BRONCHOSCOPY Bilateral 02/15/2015   Procedure: VIDEO BRONCHOSCOPY WITH FLUORO;  Surgeon: Juanito Doom, MD;  Location: [facility];  Service: Cardiopulmonary;  Laterality: Bilateral;     Current Outpatient Medications  Medication Sig Dispense Refill   aspirin EC 81 MG tablet Take 81 mg by mouth daily.     Calcium Carb-Cholecalciferol (CALCIUM 600+D3 PO) Take 1 tablet by mouth 2 (two) times daily.     estradiol (ESTRING) 2 MG vaginal ring Place 2 mg vaginally every 3 (three) months. follow package directions 1 each 4   ibuprofen (ADVIL,MOTRIN) 200 MG tablet Take 200-600 mg by mouth every 8 (eight) hours as needed (for pain).     isosorbide mononitrate (IMDUR) 30 MG 24 hr tablet Take 0.5 tablets (15 mg total) by mouth daily. 45 tablet 3   rosuvastatin (CRESTOR) 20 MG tablet Take 1 tablet (20 mg total) by mouth at bedtime. 90 tablet 3   No current facility-administered medications for this encounter.    Allergies:   Albuterol and Xopenex [levalbuterol hcl]   Social History:  The patient  reports that she quit smoking about 53 years ago. Her smoking use included cigarettes. She has a 1.60 pack-year smoking history. She has never used smokeless tobacco. She reports current alcohol use of about 2.0 - 3.0 standard drinks per week. She reports that she does not use drugs.   Family History:  The patient's family history includes Breast cancer in her maternal grandmother and mother; Heart disease in her father; Ovarian cancer in her mother.   ROS:  Please see the history of present illness.   All other systems are personally reviewed and negative.   Vitals:   03/30/21 1348  BP: 130/78  Pulse: (!) 47  SpO2: 98%  Weight: 51.7 kg (114 lb)    ReDS Vest / Clip - 03/30/21 1300       ReDS Vest / Clip   Station Marker A    Ruler Value 24    ReDS Value  Range Low volume    ReDS Actual Value 25             Exam:   General:  Well appearing. No resp difficulty HEENT: normal Neck: supple. no JVD. Carotids 2+ bilat; no bruits. No lymphadenopathy or thryomegaly appreciated. Cor: PMI nondisplaced. Regular rate & rhythm. No rubs, gallops or murmurs. Lungs: clear Abdomen: soft, nontender, nondistended. No hepatosplenomegaly. No bruits or masses. Good bowel sounds. Extremities: no cyanosis, clubbing, rash, edema Neuro: alert & orientedx3, cranial nerves grossly intact. moves all 4 extremities w/o difficulty. Affect pleasant  EKG: Sinus Brady 47 bpm Narrow QRS PR 164 Personally reviewed   Recent Labs: No results found for requested labs within last 8760 hours.  Personally reviewed   Wt Readings from Last 3 Encounters:  03/30/21 51.7 kg (114 lb)  01/26/21 52.2 kg (115 lb)  09/21/20 52.9 kg (116 lb 9.6 oz)       ASSESSMENT AND PLAN:  1. CAD with Exertional dyspnea and chest pressure:  - cath 12/18 with non-obstructive CAD and normal RH pressures.  - CPX 10/2020 with normal functional capacity.  - continue ASA & statin & Imdur - Keep LDL < 70 - PCP following lipids . 2. Mild bronchiectasis and air-trapping - Followed by Dr. [place] Bells - PFTs with mild diffusion defect - will proceed with CPX   Follow up in 1 year.   Jeanmarie Hubert, NP  03/30/2021 2:02 PM  [facility] [address] [facility] [place] [address] [phone] (office) [phone] (fax)  Patient seen and examined with the above-signed Advanced Practice Provider and/or Housestaff. I personally reviewed laboratory data, imaging studies and relevant notes. I independently examined the patient and formulated the important aspects of the plan. I have edited the note to reflect any of my changes or salient points. I have personally discussed the plan with the patient and/or family.  Overall stable. No angina. Still with  exertional dyspnea but unchanged. No evidence of HF. ReDS 25%.   General:  Well appearing. Thin. No resp difficulty HEENT: normal Neck: supple. no JVD. Carotids 2+ bilat; no bruits. No lymphadenopathy or thryomegaly appreciated. Cor: PMI nondisplaced. Regular brady No rubs, gallops or murmurs. Lungs: clear Abdomen: soft, nontender, nondistended. No hepatosplenomegaly. No bruits or masses. Good bowel sounds. Extremities: no cyanosis, clubbing, rash, edema Neuro: alert & orientedx3, cranial nerves grossly intact. moves all 4 extremities w/o difficulty. Affect pleasant  Overall stable. No evidence of angina or HF. ReDS reading low. Functional status stable. Has significant sinus bradycardia on ECG but no evidence of conduction delays which may be related to sinus node disease. No indication for PPM currently.   Glori Bickers, MD  10:57 PM

## 2021-03-31 ENCOUNTER — Ambulatory Visit: Payer: Medicare Other | Admitting: Physical Therapy

## 2021-03-31 DIAGNOSIS — R2689 Other abnormalities of gait and mobility: Secondary | ICD-10-CM | POA: Diagnosis not present

## 2021-03-31 DIAGNOSIS — R293 Abnormal posture: Secondary | ICD-10-CM | POA: Diagnosis not present

## 2021-03-31 DIAGNOSIS — M533 Sacrococcygeal disorders, not elsewhere classified: Secondary | ICD-10-CM | POA: Diagnosis not present

## 2021-03-31 DIAGNOSIS — M62838 Other muscle spasm: Secondary | ICD-10-CM | POA: Diagnosis not present

## 2021-03-31 NOTE — Therapy (Addendum)
[place] [address] [place], [place], [address] Phone: [phone]   Fax:  [phone]  Physical Therapy Treatment  Patient Details  Name: Barbara Schwartz MRN: [id number] Date of Birth: [birth year] Referring Provider (PT): Fara Olden MD   Encounter Date: 03/31/2021   PT End of Session - 03/31/21 0811     Visit Number 2    Number of Visits 10    Date for PT Re-Evaluation 06/01/21    PT Start Time 0803    PT Stop Time 0900    PT Time Calculation (min) 57 min    Activity Tolerance Patient tolerated treatment well             Past Medical History:  Diagnosis Date   Atypical nevus 01/05/1997   slight-mod- 3rd & 4th toe web Left foot -WS   BCC (basal cell carcinoma) 03/17/2004   Rigth lower inner leg (CX35Fu)   BCC (basal cell carcinoma) 12/03/2012   left upper back (CX35FU)   BCC (basal cell carcinoma) x 2 04/09/2006   right forearm (MOHS) left scalp (MOHS)   Bowen's disease x 3 08/26/2015   Left calf medial- (CX35FU) , Left calf sup. (CX35FU), Left calf inf. (CX35FU)   bowens 05/05/1996   lower right shin/outer side (CX35FU)   CHF (congestive heart failure) (HCC)    Coronary artery disease    Dyspnea    with exercise   Nodular basal cell carcinoma (BCC) 07/04/2018   left side lower leg (MOHS)   Osteopenia 02/2019   T score -1.9 FRAX 18% / 9.6%   Ovarian cancer ([place]) 1990   Stage 3   SCC (squamous cell carcinoma) 08/17/2003   left ear (CX35FU)   SCC (squamous cell carcinoma) 03/17/2004   right ant. shin (CX35FU)   SCC (squamous cell carcinoma) 09/10/2017   left lower shin   SCC (squamous cell carcinoma) x 2 12/03/2012   left outer calf (CX35FU) right cheek (CX35FU)   SCC (squamous cell carcinoma) x 2 06/29/2014   right cheek, lateral (MOHS) Right cheek,medial (MOHS)   SCC (squamous cell carcinoma) x 3 07/04/2018   back of crown, inf, Left lower leg (CX35FU), Right lower calf (CX35FU)   Squamous  cell carcinoma of skin 11/02/2019   in situ on right lower leg, posterior - CX3+5FU   Squamous cell carcinoma of skin 11/02/2019   in situ on left lower leg, posterior - CX3+5FU   Superficial basal cell carcinoma (BCC) 05/05/1996   left right shin inner side (CX35FU)   Superficial basal cell carcinoma (BCC) 08/24/2014   right submandibular (Dr Aundra Millet bx'd )    Past Surgical History:  Procedure Laterality Date   ABDOMINAL HYSTERECTOMY  1990   TAH BSO   Bowel obstruction     BREAST BIOPSY Right 11/27/2002   CARDIAC CATHETERIZATION  07/11/2017   INTRAVASCULAR PRESSURE WIRE/FFR STUDY N/A 07/11/2017   Procedure: INTRAVASCULAR PRESSURE WIRE/FFR STUDY;  Surgeon: Sherren Mocha, MD;  Location: [facility];  Service: Cardiovascular;  Laterality: N/A;   OOPHORECTOMY     BSO   RIGHT/LEFT HEART CATH AND CORONARY ANGIOGRAPHY N/A 07/11/2017   Procedure: RIGHT/LEFT HEART CATH AND CORONARY ANGIOGRAPHY;  Surgeon: Sherren Mocha, MD;  Location: [facility];  Service: Cardiovascular;  Laterality: N/A;   Squamous cell and Basal cell excision     VIDEO BRONCHOSCOPY Bilateral 02/15/2015   Procedure: VIDEO BRONCHOSCOPY WITH FLUORO;  Surgeon: Juanito Doom, MD;  Location: [facility];  Service: Cardiopulmonary;  Laterality: Bilateral;    There were no vitals filed for this visit.   Subjective Assessment - 03/31/21 0810     Subjective Pt efeel she is walking better and remembering to not cross her legs. Her lower L back is still hurts. 6-7/10 pai level requiring her to take an advil    Pertinent History Hx of ovarian CA with Tx of hysterectomy, chemotherapy,1990, Skin squamous/basal cell carcinoma removal at top of head, face, legs,arms, eye lid,,  CAD, SOB with exercises, 1 vaginal deliveries, Side effects from chemotherapy: loss of hearing ( wears hearing aids B), neuropathy of feet and hands. Hx of ankle sprains in B, Hx of falls x 3 in the past 6 months ( 6-7 falls in one year).   Physical activities: Prior to getting COVID 02/2021, pt used to play tennis 3 x week, yoga, spin classes, 3 miles. Long COVID Sx include: dry coough and tiredness. Pt noticed her stamina has not returned with playing tennis by the last set in 90 min periods compared to being able to play for 2 -2.5 hours.    Patient Stated Goals work this pain and be back on the bike                [facility] PT Assessment - 03/31/21 0811       Palpation   Spinal mobility soreness reported  at low back sideflexion R/rotation R    SI assessment  levelled shoulders and pelvis,    Palpation comment tightness at T 5-7L, T 3-4 R, occiput R attachments, L paraspinals/ intercostals T5-10 L      Ambulation/Gait   Gait Comments no hip hike, reciporcal gait                           [facility] Adult PT Treatment/Exercise - 03/31/21 [id number]       Neuro Re-ed    Neuro Re-ed Details  cued for less chest breathing,      Manual Therapy   Manual therapy comments STM/MWM at problem areas noted in assessment to promote mobility , diaphragmatic expansion                          PT Long Term Goals - 03/23/21 0932       PT LONG TERM GOAL #1   Title Pt will report decreased pain from 4/10 to  < 2/10 after 15 min of sitting on clinic soft chair and being able to sit on hard chairs with 50% less pain inorder to sit incomunity settings    Time 6    Period Weeks    Status New    Target Date 05/04/21      PT LONG TERM GOAL #2   Title Pt will have no flareups of pain in R buttock after walking on pavement for 30 min    Time 8    Period Weeks    Status New    Target Date 05/18/21      PT LONG TERM GOAL #3   Title Pt will be able ride a bike in spin class for 25 min and be compliant stretches and HEP in order to return to biking stationery bike    Time 10    Period Weeks    Status New    Target Date 06/01/21      PT LONG TERM GOAL #4   Title Pt will demo  no trunk lean and more levelled  pelvis and shoulders, and space between rib and iliac crest will be equal in order to minimize pain with sitting and walking and biking  and progress to deep core exercises to maintain alignment    Baseline 3 fingers on R flank, 4 fingers L flank between lowest rib and iliac crest    Time 2    Period Weeks    Status New    Target Date 05/18/21      PT LONG TERM GOAL #5   Title Pt will improve her FOTO score from pts to  pts inorder to demonstrate improved functional mobility  ( plan to administer at 2nd session)    Time 10    Period Weeks    Status New    Target Date 06/01/21      Additional Long Term Goals   Additional Long Term Goals --      PT LONG TERM GOAL #6   Title Pt will demonstrate decreased scar restrictions along abdominal scar in order to progress to more strengthening exercises for trunk stability for decreased risk of falls    Time 5    Period Weeks    Status New    Target Date 04/27/21      PT LONG TERM GOAL #7   Title Pt will demo increased R hip abduction strength from 2/5 to > 4/5 and no tenderness at ischial tuberosity and glut med in order to improve pelvic stability for balance and mounting a bike.                   Plan - 03/31/21 0955     Clinical Impression Statement Pt demo'd good carry over from first session with equal alignment of pelvis and shoulder and less deviations of spine. Maunal Tx was required to further improve hypomobility at thoracic and cervical segments. Following Tx, pt demo'd no pulling pain at L T/L junction and showed equal distance at B flank space between rib and iliac crest . Pt continues to benefit from skilled PT and plan to add strengthening of deep core at next session.    Personal Factors and Comorbidities Comorbidity 3+    Comorbidities Hx of ovarian CA with Tx of hysterectomy, chemotherapy,1990, Skin squamous/basal cell carcinoma removal at top of head, face, legs,arms, eye lid, CAD, SOB with exercises, 1 vaginal  deliveries, Side effects from chemotherapy: loss of hearing ( wears hearing aids B), neuropathy of feet and hands. Hx of ankle sprains in B, Hx of falls x 3 in the past 6 months ( 6-7 falls in one year). Physical activities: Prior to getting COVID 02/2021, pt used to play tennis 3 x week, yoga, spin classes, 3 miles. Long COVID Sx include: dry coough and tiredness. Pt noticed her stamina has not returned with playing tennis by the last set in 90 min periods compared to being able to play for 2 -2.5 hours    Examination-Activity Limitations Locomotion Level;Other    Stability/Clinical Decision Making Evolving/Moderate complexity    Rehab Potential Good    PT Frequency 1x / week    PT Duration Other (comment)   10   PT Treatment/Interventions Gait training;Stair training;Neuromuscular re-education;Moist Heat;Traction;Therapeutic activities;Therapeutic exercise;Patient/family education;Manual techniques;Energy conservation;Splinting;Taping;Cryotherapy;Balance training;ADLs/Self Care Home Management;Scar mobilization;Spinal Manipulations;Joint Manipulations;Functional mobility training    Consulted and Agree with Plan of Care Patient             Patient will benefit from skilled therapeutic intervention in order to  improve the following deficits and impairments:  Decreased endurance, Decreased activity tolerance, Decreased balance, Difficulty walking, Impaired flexibility, Impaired vision/preception, Decreased strength, Decreased mobility, Decreased coordination, Decreased scar mobility, Abnormal gait, Improper body mechanics, Pain, Increased muscle spasms, Postural dysfunction, Impaired perceived functional ability, Hypomobility, Decreased range of motion, Increased fascial restricitons  Visit Diagnosis: Abnormal posture  Other abnormalities of gait and mobility  Other muscle spasm     Problem List Patient Active Problem List   Diagnosis Date Noted   Hyperlipidemia 08/06/2018   Coronary  artery disease with exertional angina ([place]) 07/11/2017   CAD (coronary artery disease) 07/11/2017   Dyspnea 05/01/2016   Solitary pulmonary nodule 05/02/2015   Adverse effects of medication 02/14/2015   Cough 01/28/2015   Bronchiectasis without acute exacerbation ([place]) 01/28/2015   Shadow 01/25/2015   Ovarian ca ([place])    Osteopenia     Jerl Mina, PT 03/31/2021, 9:57 AM  [place], [place], [address] Phone: [phone]   Fax:  [phone]  Name: AVYA DOLLINS MRN: [id number] Date of Birth: Dec 10, 1945

## 2021-03-31 NOTE — Patient Instructions (Signed)
Rotation, sideflexion of spine   One sided push up at counter 20 reps  Elbow by side    __  kitchen counter stretches  Hands on kitchen counter,   Palms shoulder width apart  Minisquat postion Trunk is parallel to floor  A) Pull buttocks back to lengthen spine, knees bent  3 breaths   B) Bring R hand to the L, and stretch the R side trunk  3 breaths   Brings hands to center again Do the same to the L side stretch by placing L hand on top of R   D) Modified thread the needle R hand on L thigh, L  thigh pushing out slightly as the R hands pull in,  elbow bent and pulls to theR,  Look under L armpit   Do the same to other side

## 2021-04-04 ENCOUNTER — Ambulatory Visit: Payer: Medicare Other | Admitting: Physical Therapy

## 2021-04-04 ENCOUNTER — Other Ambulatory Visit: Payer: Self-pay

## 2021-04-04 DIAGNOSIS — R293 Abnormal posture: Secondary | ICD-10-CM

## 2021-04-04 DIAGNOSIS — R2689 Other abnormalities of gait and mobility: Secondary | ICD-10-CM | POA: Diagnosis not present

## 2021-04-04 DIAGNOSIS — M62838 Other muscle spasm: Secondary | ICD-10-CM

## 2021-04-04 DIAGNOSIS — M533 Sacrococcygeal disorders, not elsewhere classified: Secondary | ICD-10-CM | POA: Diagnosis not present
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
## 2021-04-04 NOTE — Patient Instructions (Addendum)
Deep core level 1 and 2  (Handout)   Plant feet  ___  Get into bed the same way ( sidelying)    ___  Heelraises with hand on counter  12 reps each x 3 x day   Calf stretches   ___    Side of hip stretch:  Reclined twist for hips and side of the hips/ legs  Lay on your back, knees bend Scoot hips to the R , leave shoulders in place Bring knees to the L side, back to middle   Switch other side

## 2021-04-06 ENCOUNTER — Encounter: Payer: Medicare Other | Admitting: Physical Therapy

## 2021-04-13 ENCOUNTER — Other Ambulatory Visit: Payer: Self-pay

## 2021-04-13 ENCOUNTER — Ambulatory Visit: Payer: Medicare Other | Admitting: Physical Therapy

## 2021-04-13 DIAGNOSIS — R2689 Other abnormalities of gait and mobility: Secondary | ICD-10-CM

## 2021-04-13 DIAGNOSIS — M62838 Other muscle spasm: Secondary | ICD-10-CM | POA: Diagnosis not present

## 2021-04-13 DIAGNOSIS — R293 Abnormal posture: Secondary | ICD-10-CM

## 2021-04-13 DIAGNOSIS — M533 Sacrococcygeal disorders, not elsewhere classified: Secondary | ICD-10-CM | POA: Diagnosis not present

## 2021-04-13 NOTE — Patient Instructions (Signed)
   Neck / shoulder stretches:    Lying on back - small sushi roll towel under neck  _ 6 directions  5 reps  _elbow circles in and out to lower shoulder blades down and back  10 reps  _angel wings, lower elbows down , keep arms touching bed  10 reps     ___ 
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
## 2021-04-21 ENCOUNTER — Ambulatory Visit: Payer: Medicare Other | Admitting: Physical Therapy

## 2021-04-21 DIAGNOSIS — Z23 Encounter for immunization: Secondary | ICD-10-CM | POA: Diagnosis not present

## 2021-04-27 ENCOUNTER — Other Ambulatory Visit: Payer: Self-pay

## 2021-04-27 ENCOUNTER — Ambulatory Visit: Payer: Medicare Other | Attending: Orthopaedic Surgery | Admitting: Physical Therapy

## 2021-04-27 DIAGNOSIS — M62838 Other muscle spasm: Secondary | ICD-10-CM | POA: Insufficient documentation

## 2021-04-27 DIAGNOSIS — R2689 Other abnormalities of gait and mobility: Secondary | ICD-10-CM | POA: Diagnosis not present

## 2021-04-27 DIAGNOSIS — M533 Sacrococcygeal disorders, not elsewhere classified: Secondary | ICD-10-CM | POA: Insufficient documentation

## 2021-04-27 DIAGNOSIS — R293 Abnormal posture: Secondary | ICD-10-CM | POA: Diagnosis not present

## 2021-04-27 NOTE — Therapy (Addendum)
[place] MAIN [facility] SERVICES [address] [place], [place], [address] Phone: [phone]   Fax:  [phone]  Physical Therapy Treatment  Patient Details  Name: Barbara Schwartz MRN: [id number] Date of Birth: 1946-04-04 Referring Provider (PT): Fara Olden MD   Encounter Date: 04/27/2021   PT End of Session - 04/27/21 0957     Visit Number 5    Number of Visits 10    Date for PT Re-Evaluation 06/01/21    PT Start Time 0903    PT Stop Time 1005    PT Time Calculation (min) 62 min    Activity Tolerance Patient tolerated treatment well             Past Medical History:  Diagnosis Date   Atypical nevus 01/05/1997   slight-mod- 3rd & 4th toe web Left foot -WS   BCC (basal cell carcinoma) 03/17/2004   Rigth lower inner leg (CX35Fu)   BCC (basal cell carcinoma) 12/03/2012   left upper back (CX35FU)   BCC (basal cell carcinoma) x 2 04/09/2006   right forearm (MOHS) left scalp (MOHS)   Bowen's disease x 3 08/26/2015   Left calf medial- (CX35FU) , Left calf sup. (CX35FU), Left calf inf. (CX35FU)   bowens 05/05/1996   lower right shin/outer side (CX35FU)   CHF (congestive heart failure) (HCC)    Coronary artery disease    Dyspnea    with exercise   Nodular basal cell carcinoma (BCC) 07/04/2018   left side lower leg (MOHS)   Osteopenia 02/2019   T score -1.9 FRAX 18% / 9.6%   Ovarian cancer ([place]) 1990   Stage 3   SCC (squamous cell carcinoma) 08/17/2003   left ear (CX35FU)   SCC (squamous cell carcinoma) 03/17/2004   right ant. shin (CX35FU)   SCC (squamous cell carcinoma) 09/10/2017   left lower shin   SCC (squamous cell carcinoma) x 2 12/03/2012   left outer calf (CX35FU) right cheek (CX35FU)   SCC (squamous cell carcinoma) x 2 06/29/2014   right cheek, lateral (MOHS) Right cheek,medial (MOHS)   SCC (squamous cell carcinoma) x 3 07/04/2018   back of crown, inf, Left lower leg (CX35FU), Right lower calf (CX35FU)   Squamous  cell carcinoma of skin 11/02/2019   in situ on right lower leg, posterior - CX3+5FU   Squamous cell carcinoma of skin 11/02/2019   in situ on left lower leg, posterior - CX3+5FU   Superficial basal cell carcinoma (BCC) 05/05/1996   left right shin inner side (CX35FU)   Superficial basal cell carcinoma (BCC) 08/24/2014   right submandibular (Dr Aundra Millet bx'd )    Past Surgical History:  Procedure Laterality Date   ABDOMINAL HYSTERECTOMY  1990   TAH BSO   Bowel obstruction     BREAST BIOPSY Right 11/27/2002   CARDIAC CATHETERIZATION  07/11/2017   INTRAVASCULAR PRESSURE WIRE/FFR STUDY N/A 07/11/2017   Procedure: INTRAVASCULAR PRESSURE WIRE/FFR STUDY;  Surgeon: Sherren Mocha, MD;  Location: [place] [facility];  Service: Cardiovascular;  Laterality: N/A;   OOPHORECTOMY     BSO   RIGHT/LEFT HEART CATH AND CORONARY ANGIOGRAPHY N/A 07/11/2017   Procedure: RIGHT/LEFT HEART CATH AND CORONARY ANGIOGRAPHY;  Surgeon: Sherren Mocha, MD;  Location: [place] [facility];  Service: Cardiovascular;  Laterality: N/A;   Squamous cell and Basal cell excision     VIDEO BRONCHOSCOPY Bilateral 02/15/2015   Procedure: VIDEO BRONCHOSCOPY WITH FLUORO;  Surgeon: Juanito Doom, MD;  Location: [place];  Service: Cardiopulmonary;  Laterality: Bilateral;    There were no vitals filed for this visit.   Subjective Assessment - 04/27/21 0911     Subjective Pt reported her neck   hurt for a 1 week after last session. Now after 2 weeks, it just feels sore. The hips are not hurting. The spot at the R glut area has not flared up again.    Pertinent History Hx of ovarian CA with Tx of hysterectomy, chemotherapy,1990, Skin squamous/basal cell carcinoma removal at top of head, face, legs,arms, eye lid,  CAD, SOB with exercises, 1 vaginal deliveries, Side effects from chemotherapy: loss of hearing ( wears hearing aids B), neuropathy of feet and hands. Hx of ankle sprains in B, Hx of falls x 3 in the past 6 months (  6-7 falls in one year).  Physical activities: Prior to getting COVID 02/2021, pt used to play tennis 3 x week, yoga, spin classes, 3 miles. Long COVID Sx include: dry coough and tiredness. Pt noticed her stamina has not returned with playing tennis by the last set in 90 min periods compared to being able to play for 2 -2.5 hours.    Patient Stated Goals work this pain and be back on the bike                [facility] PT Assessment - 04/27/21 0914       AROM   Overall AROM Comments side  flexion (Pre/post Tx)  R 30 deg/ 40 deg, L 25deg, 30deg ,  rotation R 50 deg/ 50 deg, L 40 deg/ 50 deg      Palpation   SI assessment  levelled pelvis, R minor slight elevation of shoulder                           [facility] Adult PT Treatment/Exercise - 04/27/21 0958       Therapeutic Activites    Other Therapeutic Activities standing desk to adjust for higher height to place ipad on for reading and minimize hunched positon      Neuro Re-ed    Neuro Re-ed Details  cued for thoracic ext before turning head to minimzie neck pain      Modalities   Modalities Moist Heat      Moist Heat Therapy   Number Minutes Moist Heat 5 Minutes    Moist Heat Location --   thoracic     Manual Therapy   Manual therapy comments STM/MWM at intercostals, thoracic 8-12                          PT Long Term Goals - 04/13/21 0936       PT LONG TERM GOAL #1   Title Pt will report decreased pain from 4/10 to  < 2/10 after 15 min of sitting on clinic soft chair and being able to sit on hard chairs with 50% less pain inorder to sit incomunity settings    Time 6    Period Weeks    Status On-going      PT LONG TERM GOAL #2   Title Pt will have no flareups of pain in R buttock after walking on pavement for 30 min    Time 8    Period Weeks    Status On-going      PT LONG TERM GOAL #3   Title Pt will be able ride a bike in spin  class for 25 min and be compliant stretches and HEP in order  to return to biking stationery bike    Time 10    Period Weeks    Status On-going      PT LONG TERM GOAL #4   Title Pt will demo no trunk lean and more levelled pelvis and shoulders, and space between rib and iliac crest will be equal in order to minimize pain with sitting and walking and biking  and progress to deep core exercises to maintain alignment    Baseline 3 fingers on R flank, 4 fingers L flank between lowest rib and iliac crest    Time 2    Period Weeks    Status On-going      PT LONG TERM GOAL #5   Title Pt will improve her FOTO score from 57 pts to  > 67 pts inorder to demonstrate improved functional mobility  ( plan to administer at 2nd session)    Baseline 57 pts    Time 10    Period Weeks    Status On-going      Additional Long Term Goals   Additional Long Term Goals Yes      PT LONG TERM GOAL #6   Title Pt will demonstrate decreased scar restrictions along abdominal scar in order to progress to more strengthening exercises for trunk stability for decreased risk of falls    Time 5    Period Weeks    Status On-going      PT LONG TERM GOAL #7   Title Pt will demo increased R hip abduction strength from 2/5 to > 4/5 and no tenderness at ischial tuberosity and glut med in order to improve pelvic stability for balance and mounting a bike.      PT LONG TERM GOAL #8   Title Pt will increase cervical mobility to look over shoulder when biking in order to minimize falling off bike and maintaining balance while walking    Baseline cervical rotation R 30 deg, L 40 deg,  flexion R 25 deg, L 30 deg,                   Plan - 04/27/21 0957     Clinical Impression Statement Pt required more thoracic/intercostal mm releases to promote thoracic extension. Pt reported no pain with cervical rotation. sideflexion with increased AROM post Tx.  This approach will fare better than working on superficial neck mm which was done at last session and it caused her pain and soreness  afterwards. Plan to continue addressing thoracic spine before returning to cervical spine. Cued for thoracic extension before cervical movements and pt reported no pain. Pt continues to benefit from skilled PT   Personal Factors and Comorbidities Comorbidity 3+    Comorbidities Hx of ovarian CA with Tx of hysterectomy, chemotherapy,1990, Skin squamous/basal cell carcinoma removal at top of head, face, legs,arms, eye lid, CAD, SOB with exercises, 1 vaginal deliveries, Side effects from chemotherapy: loss of hearing ( wears hearing aids B), neuropathy of feet and hands. Hx of ankle sprains in B, Hx of falls x 3 in the past 6 months ( 6-7 falls in one year). Physical activities: Prior to getting COVID 02/2021, pt used to play tennis 3 x week, yoga, spin classes, 3 miles. Long COVID Sx include: dry coough and tiredness. Pt noticed her stamina has not returned with playing tennis by the last set in 90 min periods compared to being able to play for  2 -2.5 hours    Examination-Activity Limitations Locomotion Level;Other    Stability/Clinical Decision Making Evolving/Moderate complexity    Rehab Potential Good    PT Frequency 1x / week    PT Duration Other (comment)   10   PT Treatment/Interventions Gait training;Stair training;Neuromuscular re-education;Moist Heat;Traction;Therapeutic activities;Therapeutic exercise;Patient/family education;Manual techniques;Energy conservation;Splinting;Taping;Cryotherapy;Balance training;ADLs/Self Care Home Management;Scar mobilization;Spinal Manipulations;Joint Manipulations;Functional mobility training    Consulted and Agree with Plan of Care Patient             Patient will benefit from skilled therapeutic intervention in order to improve the following deficits and impairments:  Decreased endurance, Decreased activity tolerance, Decreased balance, Difficulty walking, Impaired flexibility, Impaired vision/preception, Decreased strength, Decreased mobility, Decreased  coordination, Decreased scar mobility, Abnormal gait, Improper body mechanics, Pain, Increased muscle spasms, Postural dysfunction, Impaired perceived functional ability, Hypomobility, Decreased range of motion, Increased fascial restricitons  Visit Diagnosis: Other abnormalities of gait and mobility  Abnormal posture  Other muscle spasm     Problem List Patient Active Problem List   Diagnosis Date Noted   Hyperlipidemia 08/06/2018   Coronary artery disease with exertional angina ([place]) 07/11/2017   CAD (coronary artery disease) 07/11/2017   Dyspnea 05/01/2016   Solitary pulmonary nodule 05/02/2015   Adverse effects of medication 02/14/2015   Cough 01/28/2015   Bronchiectasis without acute exacerbation ([place]) 01/28/2015   Shadow 01/25/2015   Ovarian ca ([place])    Osteopenia     Jerl Mina, PT 04/27/2021, 10:57 AM  [place], [place], [address] Phone: [phone]   Fax:  [phone]  Name: Barbara Schwartz MRN: [id number] Date of Birth: 1946/03/31

## 2021-04-27 NOTE — Patient Instructions (Signed)
Before heading head, straighten midback  To minimize neck mm pain  __  Try standing desk to raise height of table to place ipad on when reading to minimize hunched position

## 2021-05-03 ENCOUNTER — Other Ambulatory Visit: Payer: Self-pay

## 2021-05-03 ENCOUNTER — Encounter: Payer: Self-pay | Admitting: Dermatology

## 2021-05-03 ENCOUNTER — Ambulatory Visit (INDEPENDENT_AMBULATORY_CARE_PROVIDER_SITE_OTHER): Payer: Medicare Other | Admitting: Dermatology

## 2021-05-03 DIAGNOSIS — D044 Carcinoma in situ of skin of scalp and neck: Secondary | ICD-10-CM

## 2021-05-03 DIAGNOSIS — D099 Carcinoma in situ, unspecified: Secondary | ICD-10-CM

## 2021-05-03 DIAGNOSIS — D485 Neoplasm of uncertain behavior of skin: Secondary | ICD-10-CM

## 2021-05-03 DIAGNOSIS — Z85828 Personal history of other malignant neoplasm of skin: Secondary | ICD-10-CM

## 2021-05-03 DIAGNOSIS — L570 Actinic keratosis: Secondary | ICD-10-CM | POA: Diagnosis not present

## 2021-05-03 DIAGNOSIS — I2510 Atherosclerotic heart disease of native coronary artery without angina pectoris: Secondary | ICD-10-CM

## 2021-05-03 DIAGNOSIS — D0439 Carcinoma in situ of skin of other parts of face: Secondary | ICD-10-CM | POA: Diagnosis not present

## 2021-05-03 HISTORY — DX: Carcinoma in situ, unspecified: D09.9

## 2021-05-03 NOTE — Patient Instructions (Signed)

## 2021-05-04 ENCOUNTER — Other Ambulatory Visit: Payer: Self-pay | Admitting: Dermatology

## 2021-05-04 ENCOUNTER — Ambulatory Visit: Payer: Medicare Other | Admitting: Physical Therapy

## 2021-05-04 DIAGNOSIS — M62838 Other muscle spasm: Secondary | ICD-10-CM

## 2021-05-04 DIAGNOSIS — R293 Abnormal posture: Secondary | ICD-10-CM

## 2021-05-04 DIAGNOSIS — R2689 Other abnormalities of gait and mobility: Secondary | ICD-10-CM

## 2021-05-04 DIAGNOSIS — L570 Actinic keratosis: Secondary | ICD-10-CM

## 2021-05-04 DIAGNOSIS — M533 Sacrococcygeal disorders, not elsewhere classified: Secondary | ICD-10-CM | POA: Diagnosis not present

## 2021-05-04 NOTE — Patient Instructions (Signed)
Lying on back, knees bent    band under ballmounds  while laying on back w/ knees bent  "W" exercise  20  reps  x 3 x day ( GO SLOW)   Band is placed under feet, knees bent, feet are hip width apart Hold band with thumbs point out, keep upper arm and elbow touching the bed the whole time  - inhale and then exhale pull bands by bending elbows hands move in a "w"  (feel shoulder blades squeezing)

## 2021-05-04 NOTE — Therapy (Addendum)
[place] MAIN [facility] SERVICES [address] [place], [place], [address] Phone: [phone]   Fax:  [phone]  Physical Therapy Treatment  Patient Details  Name: Barbara Schwartz MRN: [id number] Date of Birth: 1945/10/26 Referring Provider (PT): Fara Olden MD   Encounter Date: 05/04/2021   PT End of Session - 05/04/21 0915     Visit Number 6    Number of Visits 10    Date for PT Re-Evaluation 06/01/21    PT Start Time 0903    PT Stop Time 1000    PT Time Calculation (min) 57 min    Activity Tolerance Patient tolerated treatment well;No increased pain    Behavior During Therapy [facility] for tasks assessed/performed             Past Medical History:  Diagnosis Date   Atypical nevus 01/05/1997   slight-mod- 3rd & 4th toe web Left foot -WS   BCC (basal cell carcinoma) 03/17/2004   Rigth lower inner leg (CX35Fu)   BCC (basal cell carcinoma) 12/03/2012   left upper back (CX35FU)   BCC (basal cell carcinoma) x 2 04/09/2006   right forearm (MOHS) left scalp (MOHS)   Bowen's disease x 3 08/26/2015   Left calf medial- (CX35FU) , Left calf sup. (CX35FU), Left calf inf. (CX35FU)   bowens 05/05/1996   lower right shin/outer side (CX35FU)   CHF (congestive heart failure) (HCC)    Coronary artery disease    Dyspnea    with exercise   Nodular basal cell carcinoma (BCC) 07/04/2018   left side lower leg (MOHS)   Osteopenia 02/2019   T score -1.9 FRAX 18% / 9.6%   Ovarian cancer ([place]) 1990   Stage 3   SCC (squamous cell carcinoma) 08/17/2003   left ear (CX35FU)   SCC (squamous cell carcinoma) 03/17/2004   right ant. shin (CX35FU)   SCC (squamous cell carcinoma) 09/10/2017   left lower shin   SCC (squamous cell carcinoma) x 2 12/03/2012   left outer calf (CX35FU) right cheek (CX35FU)   SCC (squamous cell carcinoma) x 2 06/29/2014   right cheek, lateral (MOHS) Right cheek,medial (MOHS)   SCC (squamous cell carcinoma) x 3 07/04/2018    back of crown, inf, Left lower leg (CX35FU), Right lower calf (CX35FU)   Squamous cell carcinoma of skin 11/02/2019   in situ on right lower leg, posterior - CX3+5FU   Squamous cell carcinoma of skin 11/02/2019   in situ on left lower leg, posterior - CX3+5FU   Superficial basal cell carcinoma (BCC) 05/05/1996   left right shin inner side (CX35FU)   Superficial basal cell carcinoma (BCC) 08/24/2014   right submandibular (Dr Aundra Millet bx'd )    Past Surgical History:  Procedure Laterality Date   ABDOMINAL HYSTERECTOMY  1990   TAH BSO   Bowel obstruction     BREAST BIOPSY Right 11/27/2002   CARDIAC CATHETERIZATION  07/11/2017   INTRAVASCULAR PRESSURE WIRE/FFR STUDY N/A 07/11/2017   Procedure: INTRAVASCULAR PRESSURE WIRE/FFR STUDY;  Surgeon: Sherren Mocha, MD;  Location: [place] [facility];  Service: Cardiovascular;  Laterality: N/A;   OOPHORECTOMY     BSO   RIGHT/LEFT HEART CATH AND CORONARY ANGIOGRAPHY N/A 07/11/2017   Procedure: RIGHT/LEFT HEART CATH AND CORONARY ANGIOGRAPHY;  Surgeon: Sherren Mocha, MD;  Location: [place] [facility];  Service: Cardiovascular;  Laterality: N/A;   Squamous cell and Basal cell excision     VIDEO BRONCHOSCOPY Bilateral 02/15/2015   Procedure: VIDEO BRONCHOSCOPY WITH  FLUORO;  Surgeon: Juanito Doom, MD;  Location: [facility];  Service: Cardiopulmonary;  Laterality: Bilateral;    There were no vitals filed for this visit.   Subjective Assessment - 05/04/21 0910     Subjective Pt feels much better with her Post Covid fatigue and she is not dragging as much with energy. Pt reported no increased pain after last session at the neck with the treatment that was done at the midback.  Pt could not sleep last night due to neck pain. 6/10 Denied radiating pain. Pt points to occiput area as the areas of pain. It hurts with turning head or in bed.  Pt has not had any flare ups with her R glut and has been pain free for 3 weeks. Pt was able to sit on hard  surface and had no flare up.    Pertinent History Hx of ovarian CA with Tx of hysterectomy, chemotherapy,1990, Skin squamous/basal cell carcinoma removal at top of head, face, legs,arms, eye lid,  CAD, SOB with exercises, 1 vaginal deliveries, Side effects from chemotherapy: loss of hearing ( wears hearing aids B), neuropathy of feet and hands. Hx of ankle sprains in B, Hx of falls x 3 in the past 6 months ( 6-7 falls in one year).  Physical activities: Prior to getting COVID 02/2021, pt used to play tennis 3 x week, yoga, spin classes, 3 miles. Long COVID Sx include: dry coough and tiredness. Pt noticed her stamina has not returned with playing tennis by the last set in 90 min periods compared to being able to play for 2 -2.5 hours.    Patient Stated Goals work this pain and be back on the bike                [facility] PT Assessment - 05/04/21 0915       AROM   Overall AROM Comments sideflexion B 30 deg,  rotation 35 deg B/ post Tx 60 deg R, 40 L ,   supine rotation L 55 deg, R 45  deg ( post Tx 60 eg L)      Palpation   Spinal mobility restricted intercostals anterior / posterior T7-12, R medial scapula interspinal                           [facility] Adult PT Treatment/Exercise - 05/04/21 1002       Therapeutic Activites    Other Therapeutic Activities explained alignment of knees, sacpular/ cervical retraction strengthening HEP and why it helps with minimizing neck pain, explained why hip abduction strengthening will be helpful long term to perform when returning to biking ,      Neuro Re-ed    Neuro Re-ed Details  cued for scapular/cervical retraction with green band in hooklying, cued for knee alignment      Manual Therapy   Manual therapy comments STM/MWM at problem areas noted in assessment to lengthen intercostals,                          PT Long Term Goals - 05/04/21 1541       PT LONG TERM GOAL #1   Title Pt will report decreased pain from  4/10 to  < 2/10 after 15 min of sitting on clinic soft chair and being able to sit on hard chairs with 50% less pain inorder to sit incomunity settings    Time 6  Period Weeks    Status Achieved      PT LONG TERM GOAL #2   Title Pt will have no flareups of pain in R buttock after walking on pavement for 30 min    Time 8    Period Weeks    Status Achieved      PT LONG TERM GOAL #3   Title Pt will be able ride a bike in spin class for 25 min and be compliant stretches and HEP in order to return to biking stationery bike    Time 10    Period Weeks    Status On-going      PT LONG TERM GOAL #4   Title Pt will demo no trunk lean and more levelled pelvis and shoulders, and space between rib and iliac crest will be equal in order to minimize pain with sitting and walking and biking  and progress to deep core exercises to maintain alignment    Baseline 3 fingers on R flank, 4 fingers L flank between lowest rib and iliac crest    Time 2    Period Weeks    Status Achieved      PT LONG TERM GOAL #5   Title Pt will improve her FOTO score from 57 pts to  > 67 pts inorder to demonstrate improved functional mobility  ( plan to administer at 2nd session)    Baseline 57 pts    Time 10    Period Weeks    Status On-going      PT LONG TERM GOAL #6   Title Pt will demonstrate decreased scar restrictions along abdominal scar in order to progress to more strengthening exercises for trunk stability for decreased risk of falls    Time 5    Period Weeks    Status Partially Met      PT LONG TERM GOAL #7   Title Pt will demo increased R hip abduction strength from 2/5 to > 4/5 and no tenderness at ischial tuberosity and glut med in order to improve pelvic stability for balance and mounting a bike.    Time 8    Period Weeks    Status On-going      PT LONG TERM GOAL #8   Title Pt will increase cervical mobility to look over shoulder when biking in order to minimize falling off bike and maintaining  balance while walking    Baseline cervical rotation R 30 deg, L 40 deg,  flexion R 25 deg, L 30 deg,    Status On-going                   Plan - 05/04/21 0915     Clinical Impression Statement  Pt achieved 3/8 goals.  Pt reached a milestone on her 6 th sessions today and reported Pt has not had any flare ups with her R glut and has been pain free for 3 weeks. Pt was able to sit on hard surface and had no flare up.   Pt demo'd more levelled shoulders and pelvic girdle but cervical AROM relapsed from last session and pt still notices neck pain with rotation. Manual Tx addressed tightness at R intercostal area and AROM increased and pain decreased. Pt demo'd increased excursion of R anterior/ lateral ribs post Tx which will help optimize deep core system for postural stability and more adequate cervical ROM needed to bike and drive safely with head turns.   Initiated scapular stabilization strengthening in supine position. Pt  will still need more gravity-eliminated strengthening of posterior chain to increase spinal stability.  Pt is still not ready to return to biking on spin class yet.   Pt benefits from skilled PT to achieve remaining goals.    Personal Factors and Comorbidities Comorbidity 3+    Comorbidities Hx of ovarian CA with Tx of hysterectomy, chemotherapy,1990, Skin squamous/basal cell carcinoma removal at top of head, face, legs,arms, eye lid, CAD, SOB with exercises, 1 vaginal deliveries, Side effects from chemotherapy: loss of hearing ( wears hearing aids B), neuropathy of feet and hands. Hx of ankle sprains in B, Hx of falls x 3 in the past 6 months ( 6-7 falls in one year). Physical activities: Prior to getting COVID 02/2021, pt used to play tennis 3 x week, yoga, spin classes, 3 miles. Long COVID Sx include: dry coough and tiredness. Pt noticed her stamina has not returned with playing tennis by the last set in 90 min periods compared to being able to play for 2 -2.5 hours     Examination-Activity Limitations Locomotion Level;Other    Stability/Clinical Decision Making Evolving/Moderate complexity    Rehab Potential Good    PT Frequency 1x / week    PT Duration Other (comment)   10   PT Treatment/Interventions Gait training;Stair training;Neuromuscular re-education;Moist Heat;Traction;Therapeutic activities;Therapeutic exercise;Patient/family education;Manual techniques;Energy conservation;Splinting;Taping;Cryotherapy;Balance training;ADLs/Self Care Home Management;Scar mobilization;Spinal Manipulations;Joint Manipulations;Functional mobility training    Consulted and Agree with Plan of Care Patient             Patient will benefit from skilled therapeutic intervention in order to improve the following deficits and impairments:  Decreased endurance, Decreased activity tolerance, Decreased balance, Difficulty walking, Impaired flexibility, Impaired vision/preception, Decreased strength, Decreased mobility, Decreased coordination, Decreased scar mobility, Abnormal gait, Improper body mechanics, Pain, Increased muscle spasms, Postural dysfunction, Impaired perceived functional ability, Hypomobility, Decreased range of motion, Increased fascial restricitons  Visit Diagnosis: Other abnormalities of gait and mobility  Abnormal posture  Other muscle spasm     Problem List Patient Active Problem List   Diagnosis Date Noted   Hyperlipidemia 08/06/2018   Coronary artery disease with exertional angina ([place]) 07/11/2017   CAD (coronary artery disease) 07/11/2017   Dyspnea 05/01/2016   Solitary pulmonary nodule 05/02/2015   Adverse effects of medication 02/14/2015   Cough 01/28/2015   Bronchiectasis without acute exacerbation ([place]) 01/28/2015   Shadow 01/25/2015   Ovarian ca ([place])    Osteopenia     Jerl Mina, PT 05/04/2021, 3:42 PM  [place] MAIN [facility] SERVICES [address] [place], [place],  [address] Phone: [phone]   Fax:  [phone]  Name: Barbara Schwartz MRN: [id number] Date of Birth: [birth year]

## 2021-05-08 ENCOUNTER — Telehealth: Payer: Self-pay | Admitting: *Deleted

## 2021-05-08 DIAGNOSIS — L570 Actinic keratosis: Secondary | ICD-10-CM

## 2021-05-08 MED ORDER — IMIQUIMOD 5 % EX CREA: TOPICAL_CREAM | CUTANEOUS | 0 refills | Status: AC

## 2021-05-08 NOTE — Telephone Encounter (Signed)
Pathology to patient and patient also needed refill of imiquimod sent to [facility].

## 2021-05-08 NOTE — Telephone Encounter (Signed)
Left message for patient to call us back.  

## 2021-05-08 NOTE — Telephone Encounter (Signed)
-----   Message from [name], MD sent at 05/07/2021 11:59 AM EDT ----- Patient is already scheduled for a follow-up surgical appointment; we will take care of this biopsy site if there is any residual at that visit.

## 2021-05-11 ENCOUNTER — Other Ambulatory Visit: Payer: Self-pay

## 2021-05-11 ENCOUNTER — Ambulatory Visit: Payer: Medicare Other | Admitting: Physical Therapy

## 2021-05-11 DIAGNOSIS — M62838 Other muscle spasm: Secondary | ICD-10-CM | POA: Diagnosis not present

## 2021-05-11 DIAGNOSIS — R293 Abnormal posture: Secondary | ICD-10-CM

## 2021-05-11 DIAGNOSIS — R2689 Other abnormalities of gait and mobility: Secondary | ICD-10-CM | POA: Diagnosis not present

## 2021-05-11 DIAGNOSIS — M533 Sacrococcygeal disorders, not elsewhere classified: Secondary | ICD-10-CM | POA: Diagnosis not present

## 2021-05-11 NOTE — Patient Instructions (Addendum)
Mini low cobras/ CHIN TUCK :  Toes turned each other palms under armpit, elbows by ribs (like cricket wings),  imaginary pencil held in armpit,   Inhale,  Exhale press through palms to bed/floor and small lift of chest only not the chin. You will feel the bend in the midback not your low back   10 REPS    Locust pose  "sUPERWOMAN CAPE"  Pillow under hips if needed for decreased low back pain  Palms face midline by hips  Finger tips shooting straight down  Imagine holding pencil under your armpits Draw shoulders away from ears Inhale Exhale lift chest up slightly without feeling it in your back. The bend happens in the midback  (keep chin tucked)   10 REPS

## 2021-05-11 NOTE — Therapy (Addendum)
[place] MAIN [facility] SERVICES [address] [place], [place], [address] Phone: [phone]   Fax:  [phone]  Physical Therapy Treatment  Patient Details  Name: Barbara Schwartz MRN: [id number] Date of Birth: Oct 21, 1945 Referring Provider (PT): Fara Olden MD   Encounter Date: 05/11/2021   PT End of Session - 05/11/21 1000     Visit Number 7    Number of Visits 10    Date for PT Re-Evaluation 06/01/21    PT Start Time 0900    PT Stop Time 1000    PT Time Calculation (min) 60 min    Activity Tolerance Patient tolerated treatment well;No increased pain    Behavior During Therapy [facility] for tasks assessed/performed             Past Medical History:  Diagnosis Date   Atypical nevus 01/05/1997   slight-mod- 3rd & 4th toe web Left foot -WS   BCC (basal cell carcinoma) 03/17/2004   Rigth lower inner leg (CX35Fu)   BCC (basal cell carcinoma) 12/03/2012   left upper back (CX35FU)   BCC (basal cell carcinoma) x 2 04/09/2006   right forearm (MOHS) left scalp (MOHS)   Bowen's disease x 3 08/26/2015   Left calf medial- (CX35FU) , Left calf sup. (CX35FU), Left calf inf. (CX35FU)   bowens 05/05/1996   lower right shin/outer side (CX35FU)   CHF (congestive heart failure) (HCC)    Coronary artery disease    Dyspnea    with exercise   Nodular basal cell carcinoma (BCC) 07/04/2018   left side lower leg (MOHS)   Osteopenia 02/2019   T score -1.9 FRAX 18% / 9.6%   Ovarian cancer ([place]) 1990   Stage 3   SCC (squamous cell carcinoma) 08/17/2003   left ear (CX35FU)   SCC (squamous cell carcinoma) 03/17/2004   right ant. shin (CX35FU)   SCC (squamous cell carcinoma) 09/10/2017   left lower shin   SCC (squamous cell carcinoma) x 2 12/03/2012   left outer calf (CX35FU) right cheek (CX35FU)   SCC (squamous cell carcinoma) x 2 06/29/2014   right cheek, lateral (MOHS) Right cheek,medial (MOHS)   SCC (squamous cell carcinoma) x 3 07/04/2018    back of crown, inf, Left lower leg (CX35FU), Right lower calf (CX35FU)   Squamous cell carcinoma of skin 11/02/2019   in situ on right lower leg, posterior - CX3+5FU   Squamous cell carcinoma of skin 11/02/2019   in situ on left lower leg, posterior - CX3+5FU   Superficial basal cell carcinoma (BCC) 05/05/1996   left right shin inner side (CX35FU)   Superficial basal cell carcinoma (BCC) 08/24/2014   right submandibular (Dr Aundra Millet bx'd )    Past Surgical History:  Procedure Laterality Date   ABDOMINAL HYSTERECTOMY  1990   TAH BSO   Bowel obstruction     BREAST BIOPSY Right 11/27/2002   CARDIAC CATHETERIZATION  07/11/2017   INTRAVASCULAR PRESSURE WIRE/FFR STUDY N/A 07/11/2017   Procedure: INTRAVASCULAR PRESSURE WIRE/FFR STUDY;  Surgeon: Sherren Mocha, MD;  Location: [place] [facility];  Service: Cardiovascular;  Laterality: N/A;   OOPHORECTOMY     BSO   RIGHT/LEFT HEART CATH AND CORONARY ANGIOGRAPHY N/A 07/11/2017   Procedure: RIGHT/LEFT HEART CATH AND CORONARY ANGIOGRAPHY;  Surgeon: Sherren Mocha, MD;  Location: [place] [facility];  Service: Cardiovascular;  Laterality: N/A;   Squamous cell and Basal cell excision     VIDEO BRONCHOSCOPY Bilateral 02/15/2015   Procedure: VIDEO BRONCHOSCOPY WITH  FLUORO;  Surgeon: Juanito Doom, MD;  Location: [facility] ENDOSCOPY;  Service: Cardiopulmonary;  Laterality: Bilateral;    There were no vitals filed for this visit.   Subjective Assessment - 05/11/21 0904     Subjective Pt reported her neck is much better and nothing like it was even though it still bothers her when she drives and moves her neck . Pt hiked in the mountains for 1 mile and she did not have pain in her R glut and B hips.    Pertinent History Hx of ovarian CA with Tx of hysterectomy, chemotherapy,1990, Skin squamous/basal cell carcinoma removal at top of head, face, legs,arms, eye lid,  CAD, SOB with exercises, 1 vaginal deliveries, Side effects from chemotherapy: loss of  hearing ( wears hearing aids B), neuropathy of feet and hands. Hx of ankle sprains in B, Hx of falls x 3 in the past 6 months ( 6-7 falls in one year).  Physical activities: Prior to getting COVID 02/2021, pt used to play tennis 3 x week, yoga, spin classes, 3 miles. Long COVID Sx include: dry coough and tiredness. Pt noticed her stamina has not returned with playing tennis by the last set in 90 min periods compared to being able to play for 2 -2.5 hours.    Patient Stated Goals work this pain and be back on the bike                [facility] PT Assessment - 05/11/21 0911       Observation/Other Assessments   Observations less forward head      Coordination   Coordination and Movement Description R scapula downward rotation limited,  poor scapulocervical retraction      Palpation   Spinal mobility R intercostals 5-7, medial levator attachments to scapula, occiput                           [facility] Adult PT Treatment/Exercise - 05/11/21 0958       Neuro Re-ed    Neuro Re-ed Details  tactile , verbal, visual cues for cervicaoscapular stabilization      Modalities   Modalities Moist Heat      Moist Heat Therapy   Number Minutes Moist Heat 5 Minutes    Moist Heat Location --   prone , pillow under belly, heat pack on neck/ shoulder     Manual Therapy   Manual therapy comments STM/MWM at problem areas noted in assessment promote depression of upper traps, throacic extension                          PT Long Term Goals - 05/04/21 1541       PT LONG TERM GOAL #1   Title Pt will report decreased pain from 4/10 to  < 2/10 after 15 min of sitting on clinic soft chair and being able to sit on hard chairs with 50% less pain inorder to sit incomunity settings    Time 6    Period Weeks    Status Achieved      PT LONG TERM GOAL #2   Title Pt will have no flareups of pain in R buttock after walking on pavement for 30 min    Time 8    Period Weeks    Status  Achieved      PT LONG TERM GOAL #3   Title Pt will be able ride a bike  in spin class for 25 min and be compliant stretches and HEP in order to return to biking stationery bike    Time 10    Period Weeks    Status On-going      PT LONG TERM GOAL #4   Title Pt will demo no trunk lean and more levelled pelvis and shoulders, and space between rib and iliac crest will be equal in order to minimize pain with sitting and walking and biking  and progress to deep core exercises to maintain alignment    Baseline 3 fingers on R flank, 4 fingers L flank between lowest rib and iliac crest    Time 2    Period Weeks    Status Achieved      PT LONG TERM GOAL #5   Title Pt will improve her FOTO score from 57 pts to  > 67 pts inorder to demonstrate improved functional mobility  ( plan to administer at 2nd session)    Baseline 57 pts    Time 10    Period Weeks    Status On-going      PT LONG TERM GOAL #6   Title Pt will demonstrate decreased scar restrictions along abdominal scar in order to progress to more strengthening exercises for trunk stability for decreased risk of falls    Time 5    Period Weeks    Status Partially Met      PT LONG TERM GOAL #7   Title Pt will demo increased R hip abduction strength from 2/5 to > 4/5 and no tenderness at ischial tuberosity and glut med in order to improve pelvic stability for balance and mounting a bike.    Time 8    Period Weeks    Status On-going      PT LONG TERM GOAL #8   Title Pt will increase cervical mobility to look over shoulder when biking in order to minimize falling off bike and maintaining balance while walking    Baseline cervical rotation R 30 deg, L 40 deg,  flexion R 25 deg, L 30 deg,    Status On-going                   Plan - 05/11/21 1010     Clinical Impression Statement Pt is making good progress and was able to hike in the mountains for 1 mile and had no pain. Continued to apply manual Tx to R neck and intercostal mm  to minimize tensions. Pt required propioception training to strengthen cervico-scapular stabilization. Pt continues to benefit from skilled PT    Personal Factors and Comorbidities Comorbidity 3+    Comorbidities Hx of ovarian CA with Tx of hysterectomy, chemotherapy,1990, Skin cancer 2000 with removal at top of head, face, legs,arms, eye lid, CAD, SOB with exercises, 1 vaginal deliveries, Side effects from chemotherapy: loss of hearing ( wears hearing aids B), neuropathy of feet and hands. Hx of ankle sprains in B, Hx of falls x 3 in the past 6 months ( 6-7 falls in one year). Physical activities: Prior to getting COVID 02/2021, pt used to play tennis 3 x week, yoga, spin classes, 3 miles. Long COVID Sx include: dry coough and tiredness. Pt noticed her stamina has not returned with playing tennis by the last set in 90 min periods compared to being able to play for 2 -2.5 hours    Examination-Activity Limitations Locomotion Level;Other    Stability/Clinical Decision Making Evolving/Moderate complexity    Rehab Potential  Good    PT Frequency 1x / week    PT Duration Other (comment)   10   PT Treatment/Interventions Gait training;Stair training;Neuromuscular re-education;Moist Heat;Traction;Therapeutic activities;Therapeutic exercise;Patient/family education;Manual techniques;Energy conservation;Splinting;Taping;Cryotherapy;Balance training;ADLs/Self Care Home Management;Scar mobilization;Spinal Manipulations;Joint Manipulations;Functional mobility training    Consulted and Agree with Plan of Care Patient             Patient will benefit from skilled therapeutic intervention in order to improve the following deficits and impairments:  Decreased endurance, Decreased activity tolerance, Decreased balance, Difficulty walking, Impaired flexibility, Impaired vision/preception, Decreased strength, Decreased mobility, Decreased coordination, Decreased scar mobility, Abnormal gait, Improper body mechanics,  Pain, Increased muscle spasms, Postural dysfunction, Impaired perceived functional ability, Hypomobility, Decreased range of motion, Increased fascial restricitons  Visit Diagnosis: Other abnormalities of gait and mobility  Abnormal posture  Other muscle spasm     Problem List Patient Active Problem List   Diagnosis Date Noted   Hyperlipidemia 08/06/2018   Coronary artery disease with exertional angina ([place]) 07/11/2017   CAD (coronary artery disease) 07/11/2017   Dyspnea 05/01/2016   Solitary pulmonary nodule 05/02/2015   Adverse effects of medication 02/14/2015   Cough 01/28/2015   Bronchiectasis without acute exacerbation ([place]) 01/28/2015   Shadow 01/25/2015   Ovarian ca ([place])    Osteopenia     Jerl Mina, PT 05/11/2021, 10:13 AM  [place] [place], [place], [address] Phone: [phone]   Fax:  [phone]  Name: Barbara Schwartz MRN: [id number] Date of Birth: 1945/11/05

## 2021-05-16 ENCOUNTER — Other Ambulatory Visit: Payer: Self-pay

## 2021-05-16 ENCOUNTER — Ambulatory Visit: Payer: Medicare Other | Admitting: Physical Therapy

## 2021-05-16 ENCOUNTER — Telehealth: Payer: Self-pay | Admitting: *Deleted

## 2021-05-16 DIAGNOSIS — M62838 Other muscle spasm: Secondary | ICD-10-CM

## 2021-05-16 DIAGNOSIS — R293 Abnormal posture: Secondary | ICD-10-CM | POA: Diagnosis not present

## 2021-05-16 DIAGNOSIS — M533 Sacrococcygeal disorders, not elsewhere classified: Secondary | ICD-10-CM | POA: Diagnosis not present

## 2021-05-16 DIAGNOSIS — Z803 Family history of malignant neoplasm of breast: Secondary | ICD-10-CM

## 2021-05-16 DIAGNOSIS — R2689 Other abnormalities of gait and mobility: Secondary | ICD-10-CM

## 2021-05-16 NOTE — Therapy (Addendum)
[place] MAIN [facility] SERVICES [address] [place], [place], [address] Phone: [phone]   Fax:  [phone]  Physical Therapy Treatment  Patient Details  Name: Barbara Schwartz MRN: [id number] Date of Birth: August 11, 1945 Referring Provider (PT): Fara Olden MD   Encounter Date: 05/16/2021   PT End of Session - 05/16/21 1039     Visit Number 8    Number of Visits 10    Date for PT Re-Evaluation 06/01/21    PT Start Time 2409    PT Stop Time 1000    PT Time Calculation (min) 46 min    Activity Tolerance Patient tolerated treatment well;No increased pain    Behavior During Therapy [facility] for tasks assessed/performed             Past Medical History:  Diagnosis Date   Atypical nevus 01/05/1997   slight-mod- 3rd & 4th toe web Left foot -WS   BCC (basal cell carcinoma) 03/17/2004   Rigth lower inner leg (CX35Fu)   BCC (basal cell carcinoma) 12/03/2012   left upper back (CX35FU)   BCC (basal cell carcinoma) x 2 04/09/2006   right forearm (MOHS) left scalp (MOHS)   Bowen's disease x 3 08/26/2015   Left calf medial- (CX35FU) , Left calf sup. (CX35FU), Left calf inf. (CX35FU)   bowens 05/05/1996   lower right shin/outer side (CX35FU)   CHF (congestive heart failure) (HCC)    Coronary artery disease    Dyspnea    with exercise   Nodular basal cell carcinoma (BCC) 07/04/2018   left side lower leg (MOHS)   Osteopenia 02/2019   T score -1.9 FRAX 18% / 9.6%   Ovarian cancer ([place]) 1990   Stage 3   SCC (squamous cell carcinoma) 08/17/2003   left ear (CX35FU)   SCC (squamous cell carcinoma) 03/17/2004   right ant. shin (CX35FU)   SCC (squamous cell carcinoma) 09/10/2017   left lower shin   SCC (squamous cell carcinoma) x 2 12/03/2012   left outer calf (CX35FU) right cheek (CX35FU)   SCC (squamous cell carcinoma) x 2 06/29/2014   right cheek, lateral (MOHS) Right cheek,medial (MOHS)   SCC (squamous cell carcinoma) x 3 07/04/2018    back of crown, inf, Left lower leg (CX35FU), Right lower calf (CX35FU)   Squamous cell carcinoma of skin 11/02/2019   in situ on right lower leg, posterior - CX3+5FU   Squamous cell carcinoma of skin 11/02/2019   in situ on left lower leg, posterior - CX3+5FU   Superficial basal cell carcinoma (BCC) 05/05/1996   left right shin inner side (CX35FU)   Superficial basal cell carcinoma (BCC) 08/24/2014   right submandibular (Dr Aundra Millet bx'd )    Past Surgical History:  Procedure Laterality Date   ABDOMINAL HYSTERECTOMY  1990   TAH BSO   Bowel obstruction     BREAST BIOPSY Right 11/27/2002   CARDIAC CATHETERIZATION  07/11/2017   INTRAVASCULAR PRESSURE WIRE/FFR STUDY N/A 07/11/2017   Procedure: INTRAVASCULAR PRESSURE WIRE/FFR STUDY;  Surgeon: Sherren Mocha, MD;  Location: [place] [facility];  Service: Cardiovascular;  Laterality: N/A;   OOPHORECTOMY     BSO   RIGHT/LEFT HEART CATH AND CORONARY ANGIOGRAPHY N/A 07/11/2017   Procedure: RIGHT/LEFT HEART CATH AND CORONARY ANGIOGRAPHY;  Surgeon: Sherren Mocha, MD;  Location: [place] [facility] LAB;  Service: Cardiovascular;  Laterality: N/A;   Squamous cell and Basal cell excision     VIDEO BRONCHOSCOPY Bilateral 02/15/2015   Procedure: VIDEO BRONCHOSCOPY WITH  FLUORO;  Surgeon: Juanito Doom, MD;  Location: [facility] ENDOSCOPY;  Service: Cardiopulmonary;  Laterality: Bilateral;    There were no vitals filed for this visit.   Subjective Assessment - 05/16/21 0914     Subjective Pt reported her neck is better. She feels it is improving. Pt gets up 2 x a night to use the bathroom at midnight and 2 o'clock.  Pt stops drinking water and liquids at 6-7pm. Pt goes to bed at 9:30pm. Pt has been getting up 2 x night for a long night. Pt had a sleep study years ago. Pt and husband has been regularly walking 3.5 miles in one hour starting this past week.    Pertinent History Hx of ovarian CA with Tx of hysterectomy, chemotherapy,1990, Skin squamous/basal cell  carcinoma removal at top of head, face, legs,arms, eye lid,,  CAD, SOB with exercises, 1 vaginal deliveries, Side effects from chemotherapy: loss of hearing ( wears hearing aids B), neuropathy of feet and hands. Hx of ankle sprains in B, Hx of falls x 3 in the past 6 months ( 6-7 falls in one year).  Physical activities: Prior to getting COVID 02/2021, pt used to play tennis 3 x week, yoga, spin classes, 3 miles. Long COVID Sx include: dry cough and tiredness. Pt noticed her stamina has not returned with playing tennis by the last set in 90 min periods compared to being able to play for 2 -2.5 hours.    Patient Stated Goals work this pain and be back on the bike                [facility] PT Assessment - 05/16/21 0926       Coordination   Coordination and Movement Description scapular downward rotation B normal, slightly elevated R shoulder      AROM   Overall AROM Comments seated: pre/post: sideflexion B 30deg/ 35  deg,  rotation R 50/ 60deg, L 30 deg/ 50 deg   supine : R 60 deg, 40 deg L  rotation (post Tx 50 deg B)     Palpation   Spinal mobility intercostal mm tightness meedial/posterior border of scapula, paraspinal/ interspinal mm tightness,                           [facility] Adult PT Treatment/Exercise - 05/16/21 1038       Therapeutic Activites    Other Therapeutic Activities reassessment of cervical/ thoracic spine      Modalities   Modalities Moist Heat      Moist Heat Therapy   Number Minutes Moist Heat 5 Minutes    Moist Heat Location --   cervical/thoracic     Manual Therapy   Manual therapy comments STM/MWM at problem areas noted in assessment extend thoracic spine, rotation of thoracic , lengthen shoulder mm                         PT Long Term Goals - 05/04/21 1541       PT LONG TERM GOAL #1   Title Pt will report decreased pain from 4/10 to  < 2/10 after 15 min of sitting on clinic soft chair and being able to sit on hard chairs  with 50% less pain inorder to sit incomunity settings    Time 6    Period Weeks    Status Achieved      PT LONG TERM GOAL #2  Title Pt will have no flareups of pain in R buttock after walking on pavement for 30 min    Time 8    Period Weeks    Status Achieved      PT LONG TERM GOAL #3   Title Pt will be able ride a bike in spin class for 25 min and be compliant stretches and HEP in order to return to biking stationery bike    Time 10    Period Weeks    Status On-going      PT LONG TERM GOAL #4   Title Pt will demo no trunk lean and more levelled pelvis and shoulders, and space between rib and iliac crest will be equal in order to minimize pain with sitting and walking and biking  and progress to deep core exercises to maintain alignment    Baseline 3 fingers on R flank, 4 fingers L flank between lowest rib and iliac crest    Time 2    Period Weeks    Status Achieved      PT LONG TERM GOAL #5   Title Pt will improve her FOTO score from 57 pts to  > 67 pts inorder to demonstrate improved functional mobility  ( plan to administer at 2nd session)    Baseline 57 pts    Time 10    Period Weeks    Status On-going      PT LONG TERM GOAL #6   Title Pt will demonstrate decreased scar restrictions along abdominal scar in order to progress to more strengthening exercises for trunk stability for decreased risk of falls    Time 5    Period Weeks    Status Partially Met      PT LONG TERM GOAL #7   Title Pt will demo increased R hip abduction strength from 2/5 to > 4/5 and no tenderness at ischial tuberosity and glut med in order to improve pelvic stability for balance and mounting a bike.    Time 8    Period Weeks    Status On-going      PT LONG TERM GOAL #8   Title Pt will increase cervical mobility to look over shoulder when biking in order to minimize falling off bike and maintaining balance while walking    Baseline cervical rotation R 30 deg, L 40 deg,  flexion R 25 deg, L 30 deg,     Status On-going                   Plan - 05/16/21 1054     Clinical Impression Statement Pt continues to show gradual improvement with thoracic and cervical spine but still required further manual Tx to R upper quadrant today. Technique was modified when pt was expressing tenderness/ SOB. Sustained pressure and lighter pressure was provided to help bring comfort to pt to tolerate Tx. Pt demo'd increased cervical / thoracic rotation post Tx. Plan to add lower kinetic chain stretches to HEP at next session as pt has started walk 3.5 miles within 1 hours this past week. Anticipate stretches will help minimize tightness and prolong her aerobic fitness. Pt continues to benefit from skilled PT.     Personal Factors and Comorbidities Comorbidity 3+    Comorbidities Hx of ovarian CA with Tx of hysterectomy, chemotherapy,1990, Skin squamous/basal cell carcinoma removal at top of head, face, legs,arms, eye lid, CAD, SOB with exercises, 1 vaginal deliveries, Side effects from chemotherapy: loss of hearing ( wears hearing aids  B), neuropathy of feet and hands. Hx of ankle sprains in B, Hx of falls x 3 in the past 6 months ( 6-7 falls in one year). Physical activities: Prior to getting COVID 02/2021, pt used to play tennis 3 x week, yoga, spin classes, 3 miles. Long COVID Sx include: dry coough and tiredness. Pt noticed her stamina has not returned with playing tennis by the last set in 90 min periods compared to being able to play for 2 -2.5 hours    Examination-Activity Limitations Locomotion Level;Other    Stability/Clinical Decision Making Evolving/Moderate complexity    Rehab Potential Good    PT Frequency 1x / week    PT Duration Other (comment)   10   PT Treatment/Interventions Gait training;Stair training;Neuromuscular re-education;Moist Heat;Traction;Therapeutic activities;Therapeutic exercise;Patient/family education;Manual techniques;Energy conservation;Splinting;Taping;Cryotherapy;Balance  training;ADLs/Self Care Home Management;Scar mobilization;Spinal Manipulations;Joint Manipulations;Functional mobility training    Consulted and Agree with Plan of Care Patient             Patient will benefit from skilled therapeutic intervention in order to improve the following deficits and impairments:  Decreased endurance, Decreased activity tolerance, Decreased balance, Difficulty walking, Impaired flexibility, Impaired vision/preception, Decreased strength, Decreased mobility, Decreased coordination, Decreased scar mobility, Abnormal gait, Improper body mechanics, Pain, Increased muscle spasms, Postural dysfunction, Impaired perceived functional ability, Hypomobility, Decreased range of motion, Increased fascial restricitons  Visit Diagnosis: Other abnormalities of gait and mobility  Abnormal posture  Other muscle spasm     Problem List Patient Active Problem List   Diagnosis Date Noted   Hyperlipidemia 08/06/2018   Coronary artery disease with exertional angina ([place]) 07/11/2017   CAD (coronary artery disease) 07/11/2017   Dyspnea 05/01/2016   Solitary pulmonary nodule 05/02/2015   Adverse effects of medication 02/14/2015   Cough 01/28/2015   Bronchiectasis without acute exacerbation ([place]) 01/28/2015   Shadow 01/25/2015   Ovarian ca ([place])    Osteopenia     Jerl Mina, PT 05/16/2021, 10:55 AM  [place], [place], [address] Phone: [phone]   Fax:  [phone]  Name: Barbara Schwartz MRN: [id number] Date of Birth: 07-Mar-1946

## 2021-05-16 NOTE — Telephone Encounter (Signed)
Patient called requesting yearly breast Mri, order placed at [place]. Patient aware she can call and schedule.

## 2021-05-17 NOTE — Telephone Encounter (Signed)
Barbara Schwartz patient scheduled on 05/30/21 at [place] for Breast Mri.   Dr.Lavoie just wanted you to know I placed order for breast MRI, she gets them yearly.

## 2021-05-17 NOTE — Telephone Encounter (Signed)
PA not required.  Encounter closed.

## 2021-05-20 ENCOUNTER — Encounter: Payer: Self-pay | Admitting: Dermatology

## 2021-05-20 NOTE — Progress Notes (Signed)
   Follow-Up Visit   Subjective  Barbara Schwartz is a 75 y.o. female who presents for the following: Follow-up (New lesion on the left leg, patient stated she wanted the chin checked and scalp possible previous biopsy).  Multiple nonmelanoma skin cancers including 2 new spots Location:  Duration:  Quality:  Associated Signs/Symptoms: Modifying Factors:  Severity:  Timing: Context:   Objective  Well appearing patient in no apparent distress; mood and affect are within normal limits. Right Wrist - Posterior (2) Gritty 5 mm pink crust  Right Submandibular Area Verrucous pink 6 mm crust verrucous       Scalp The superficial carcinomas treated with Aldara and responded with good tolerance (historically she had an intolerable reaction to using the same medication on her chest years ago).    A focused examination was performed including scalp, face, neck, arms, legs. Relevant physical exam findings are noted in the Assessment and Plan.   Assessment & Plan    AK (actinic keratosis) (2) Right Wrist - Posterior  Destruction of lesion - Right Wrist - Posterior Complexity: simple   Destruction method: cryotherapy   Informed consent: discussed and consent obtained   Timeout:  patient name, date of birth, surgical site, and procedure verified Lesion destroyed using liquid nitrogen: Yes   Cryotherapy cycles:  3 Outcome: patient tolerated procedure well with no complications   Post-procedure details: wound care instructions given    Carcinoma in situ of skin of scalp and neck Right Submandibular Area  Skin / nail biopsy Type of biopsy: tangential   Informed consent: discussed and consent obtained   Timeout: patient name, date of birth, surgical site, and procedure verified   Procedure prep:  Patient was prepped and draped in usual sterile fashion (Non sterile) Prep type:  Chlorhexidine Anesthesia: the lesion was anesthetized in a standard fashion   Anesthetic:  1%  lidocaine w/ epinephrine 1-100,000 local infiltration Instrument used: flexible razor blade   Hemostasis achieved with: ferric subsulfate and electrodesiccation   Outcome: patient tolerated procedure well   Post-procedure details: sterile dressing applied and wound care instructions given   Dressing type: bandage and petrolatum    Destruction of lesion Complexity: simple   Destruction method: electrodesiccation and curettage   Informed consent: discussed and consent obtained   Timeout:  patient name, date of birth, surgical site, and procedure verified Anesthesia: the lesion was anesthetized in a standard fashion   Anesthetic:  1% lidocaine w/ epinephrine 1-100,000 local infiltration Curettage cycles:  1 Lesion length (cm):  0.6 Lesion width (cm):  0.6 Margin per side (cm):  0 Final wound size (cm):  0.6 Hemostasis achieved with:  ferric subsulfate Outcome: patient tolerated procedure well with no complications   Post-procedure details: sterile dressing applied and wound care instructions given   Dressing type: bandage and petrolatum    Specimen 1 - Surgical pathology Differential Diagnosis: R/O BCC vs SCC - cautery after biopsy  Check Margins: No  After shave biopsy the base of the lesion was curetted and cauterized.  Personal history of skin cancer Scalp  Patient is willing to reuse the Aldara for remaining crusts.  Application will be 3-5 times weekly with a goal of roughly 27-51 total applications or until there is a vigorous inflammatory response.      I, [name], MD, have reviewed all documentation for this visit.  The documentation on 05/20/21 for the exam, diagnosis, procedures, and orders are all accurate and complete.

## 2021-05-22 ENCOUNTER — Encounter: Payer: Self-pay | Admitting: Dermatology

## 2021-05-23 ENCOUNTER — Other Ambulatory Visit: Payer: Self-pay

## 2021-05-23 ENCOUNTER — Ambulatory Visit: Payer: Medicare Other | Attending: Orthopaedic Surgery | Admitting: Physical Therapy

## 2021-05-23 DIAGNOSIS — M533 Sacrococcygeal disorders, not elsewhere classified: Secondary | ICD-10-CM | POA: Diagnosis not present

## 2021-05-23 DIAGNOSIS — R293 Abnormal posture: Secondary | ICD-10-CM | POA: Diagnosis not present

## 2021-05-23 DIAGNOSIS — M62838 Other muscle spasm: Secondary | ICD-10-CM | POA: Insufficient documentation

## 2021-05-23 DIAGNOSIS — R2689 Other abnormalities of gait and mobility: Secondary | ICD-10-CM | POA: Diagnosis not present

## 2021-05-23 NOTE — Patient Instructions (Signed)
Pat yourself on back/ reaching behind back with hand towel Lengthening around shoulder blade /ribs, stretching opposite pect /front shoulder muscles:   R hand pats back of your band, elbow pointed up to ceiling/ holding hand towel  Roll L shoulder back and down, grab bottom of hand towel , pull gently down  *Press back of L hand against back , inhale expand back, make sure to not over arch the low back,  Keep chin tucked, press head back as if wall is behind you   __

## 2021-05-24 NOTE — Therapy (Addendum)
[place] MAIN [facility] SERVICES [address] [place], [place], [address] Phone: [phone]   Fax:  [phone]  Physical Therapy Treatment  Patient Details  Name: Barbara Schwartz MRN: [id number] Date of Birth: 1946-03-22 Referring Provider (PT): Fara Olden MD   Encounter Date: 05/23/2021   PT End of Session - 05/23/21 0907     Visit Number 9    Number of Visits 10    Date for PT Re-Evaluation 06/01/21    PT Start Time 0903    PT Stop Time 1000    PT Time Calculation (min) 57 min    Activity Tolerance Patient tolerated treatment well;No increased pain    Behavior During Therapy [facility] for tasks assessed/performed             Past Medical History:  Diagnosis Date   Atypical nevus 01/05/1997   slight-mod- 3rd & 4th toe web Left foot -WS   BCC (basal cell carcinoma) 03/17/2004   Rigth lower inner leg (CX35Fu)   BCC (basal cell carcinoma) 12/03/2012   left upper back (CX35FU)   BCC (basal cell carcinoma) x 2 04/09/2006   right forearm (MOHS) left scalp (MOHS)   Bowen's disease x 3 08/26/2015   Left calf medial- (CX35FU) , Left calf sup. (CX35FU), Left calf inf. (CX35FU)   bowens 05/05/1996   lower right shin/outer side (CX35FU)   CHF (congestive heart failure) (HCC)    Coronary artery disease    Dyspnea    with exercise   Nodular basal cell carcinoma (BCC) 07/04/2018   left side lower leg (MOHS)   Osteopenia 02/2019   T score -1.9 FRAX 18% / 9.6%   Ovarian cancer ([place]) 1990   Stage 3   SCC (squamous cell carcinoma) 08/17/2003   left ear (CX35FU)   SCC (squamous cell carcinoma) 03/17/2004   right ant. shin (CX35FU)   SCC (squamous cell carcinoma) 09/10/2017   left lower shin   SCC (squamous cell carcinoma) x 2 12/03/2012   left outer calf (CX35FU) right cheek (CX35FU)   SCC (squamous cell carcinoma) x 2 06/29/2014   right cheek, lateral (MOHS) Right cheek,medial (MOHS)   SCC (squamous cell carcinoma) x 3 07/04/2018    back of crown, inf, Left lower leg (CX35FU), Right lower calf (CX35FU)   Squamous cell carcinoma of skin 11/02/2019   in situ on right lower leg, posterior - CX3+5FU   Squamous cell carcinoma of skin 11/02/2019   in situ on left lower leg, posterior - CX3+5FU   Superficial basal cell carcinoma (BCC) 05/05/1996   left right shin inner side (CX35FU)   Superficial basal cell carcinoma (BCC) 08/24/2014   right submandibular (Dr Aundra Millet bx'd )    Past Surgical History:  Procedure Laterality Date   ABDOMINAL HYSTERECTOMY  1990   TAH BSO   Bowel obstruction     BREAST BIOPSY Right 11/27/2002   CARDIAC CATHETERIZATION  07/11/2017   INTRAVASCULAR PRESSURE WIRE/FFR STUDY N/A 07/11/2017   Procedure: INTRAVASCULAR PRESSURE WIRE/FFR STUDY;  Surgeon: Sherren Mocha, MD;  Location: [place] [facility];  Service: Cardiovascular;  Laterality: N/A;   OOPHORECTOMY     BSO   RIGHT/LEFT HEART CATH AND CORONARY ANGIOGRAPHY N/A 07/11/2017   Procedure: RIGHT/LEFT HEART CATH AND CORONARY ANGIOGRAPHY;  Surgeon: Sherren Mocha, MD;  Location: [place] CV [facility];  Service: Cardiovascular;  Laterality: N/A;   Squamous cell and Basal cell excision     VIDEO BRONCHOSCOPY Bilateral 02/15/2015   Procedure: VIDEO BRONCHOSCOPY WITH  FLUORO;  Surgeon: Juanito Doom, MD;  Location: [facility] END[facility]COPY;  Service: Cardiopulmonary;  Laterality: Bilateral;    There were no vitals filed for this visit.   Subjective Assessment - 05/23/21 0906     Subjective Pt reported her neck is better and no longer interrupting her sleep this past week.    The muscles behind the ear are still hurting and it is there consistently. Sometimes it is much more intensely with movement and looking down with reading. Pt points that pain stays at occiput to shoulders and states it does not radiate down to hands. The pain is a sharp pain and sometimes she hear cracking Pt had a fall onto her chin and she experienced cracking like does now. Pt has not  received imaging at the time of this fall. She said her neck gone better and did not crack with time.   Pt has not experienced her R hip and glut pain has been gone for the past 2 weeks. Pt enjoys reading for up to 3 hours. Pt played tennis and her teammates noticed she has not fallen lately. since starting PT.    Pertinent History Hx of ovarian CA with Tx of hysterectomy, chemotherapy,1990, Skin squamous/basal cell carcinoma removal at top of head, face, legs,arms, eye lid,  CAD, SOB with exercises, 1 vaginal deliveries, Side effects from chemotherapy: loss of hearing ( wears hearing aids B), neuropathy of feet and hands. Hx of ankle sprains in B, Hx of falls x 3 in the past 6 months ( 6-7 falls in one year).  Physical activities: Prior to getting COVID 02/2021, pt used to play tennis 3 x week, yoga, spin classes, 3 miles. Long COVID Sx include: dry cough and tiredness. Pt noticed her stamina has not returned with playing tennis by the last set in 90 min periods compared to being able to play for 2 -2.5 hours.    Patient Stated Goals work this pain and be back on the bike                [facility] PT Assessment - 05/24/21 1211       Posture/Postural Control   Posture Comments cervical mm endurance test 2 min, 48 sec      Palpation   Spinal mobility R  interspinal mm/ intercostal  tightness along upper thoracic                           [facility] Adult PT Treatment/Exercise - 05/24/21 1211       Therapeutic Activites    Other Therapeutic Activities discussed modifications to activities to minimize looking down posture which is contributing to strained neck mm, discussed well balanced fitness program to minimize worsening of osteopenia      Modalities   Modalities Moist Heat      Moist Heat Therapy   Number Minutes Moist Heat 5 Minutes    Moist Heat Location --   thoracic     Manual Therapy   Manual therapy comments STM/MWM at problem areas noted in assessment extend  thoracic spine, diaphragmatic excursion                          PT Long Term Goals - 05/24/21 1212       PT LONG TERM GOAL #1   Title Pt will report decreased pain from 4/10 to  < 2/10 after 15 min of sitting on clinic soft  chair and being able to sit on hard chairs with 50% less pain inorder to sit incomunity settings    Time 6    Period Weeks    Status Achieved      PT LONG TERM GOAL #2   Title Pt will have no flareups of pain in R buttock after walking on pavement for 30 min    Time 8    Period Weeks    Status Achieved      PT LONG TERM GOAL #3   Title Pt will be able ride a bike in spin class for 25 min and be compliant stretches and HEP in order to return to biking stationery bike    Time 10    Period Weeks    Status On-going      PT LONG TERM GOAL #4   Title Pt will demo no trunk lean and more levelled pelvis and shoulders, and space between rib and iliac crest will be equal in order to minimize pain with sitting and walking and biking  and progress to deep core exercises to maintain alignment    Baseline 3 fingers on R flank, 4 fingers L flank between lowest rib and iliac crest    Time 2    Period Weeks    Status Achieved      PT LONG TERM GOAL #5   Title Pt will improve her FOTO score from 57 pts to  > 67 pts inorder to demonstrate improved functional mobility  ( plan to administer at 2nd session)    Baseline 57 pts    Time 10    Period Weeks    Status On-going      Additional Long Term Goals   Additional Long Term Goals Yes      PT LONG TERM GOAL #6   Title Pt will demonstrate decreased scar restrictions along abdominal scar in order to progress to more strengthening exercises for trunk stability for decreased risk of falls    Time 5    Period Weeks    Status Partially Met      PT LONG TERM GOAL #7   Title Pt will demo increased R hip abduction strength from 2/5 to > 4/5 and no tenderness at ischial tuberosity and glut med in order to improve  pelvic stability for balance and mounting a bike.    Time 8    Period Weeks    Status On-going      PT LONG TERM GOAL #8   Title Pt will increase cervical mobility to look over shoulder when biking in order to minimize falling off bike and maintaining balance while walking    Baseline cervical rotation R 30 deg, L 40 deg,  flexion R 25 deg, L 30 deg,    Status On-going      PT LONG TERM GOAL  #9   TITLE Pt will increase cervical mm endurance test time from  2 min, 48 sec  -> > 3 min in order to minimize neck pain and demo no more forward head posture / throacic kyphosis    Time 4    Period Weeks    Status New    Target Date 06/21/21                   Plan - 05/23/21 1404     Clinical Impression Statement Pt has not experienced her R hip and glut pain has been gone for the past 2 weeks.  Pt played tennis  and her teammates noticed she has not fallen lately since starting PT. These are improvements have come about with a better realigned spine.pelvis and her compliance to HEP.   Pt reported her neck pain did not interrupt her sleep last week which is an improvement. Pt still reports sharp mm ache  at the muscles behind the ear are still hurting and it is there consistently and with looking down. Continued to address R UE quadrant today wth manual Tx at intercostals / R scapula to continue promoting scapular retraction/ diaphragmatic excursion.  Discussed modifications to activities to minimize looking down posture which is contributing to strained neck mm, discussed well balanced fitness program to minimize worsening of osteopenia and fall risks given pt 's Hx of falls.    Anticipate more manual at thoracic spine and cervical endurance strengthening will help improve posture. Plan to communicate with PCP re: updated imaging on cervical spine as pt reports she had a fall in the past onto her chin.   Pt continues to benefit from skilled PT.     Personal Factors and Comorbidities  Comorbidity 3+    Comorbidities Hx of ovarian CA with Tx of hysterectomy, chemotherapy,1990, Skin squamous/basal cell carcinoma removal at top of head, face, legs,arms, eye lid, CAD, SOB with exercises, 1 vaginal deliveries, Side effects from chemotherapy: loss of hearing ( wears hearing aids B), neuropathy of feet and hands. Hx of ankle sprains in B, Hx of falls x 3 in the past 6 months ( 6-7 falls in one year). Physical activities: Prior to getting COVID 02/2021, pt used to play tennis 3 x week, yoga, spin classes, 3 miles. Long COVID Sx include: dry coough and tiredness. Pt noticed her stamina has not returned with playing tennis by the last set in 90 min periods compared to being able to play for 2 -2.5 hours    Examination-Activity Limitations Locomotion Level;Other    Stability/Clinical Decision Making Evolving/Moderate complexity    Rehab Potential Good    PT Frequency 1x / week    PT Duration Other (comment)   10   PT Treatment/Interventions Gait training;Stair training;Neuromuscular re-education;Moist Heat;Traction;Therapeutic activities;Therapeutic exercise;Patient/family education;Manual techniques;Energy conservation;Splinting;Taping;Cryotherapy;Balance training;ADLs/Self Care Home Management;Scar mobilization;Spinal Manipulations;Joint Manipulations;Functional mobility training    Consulted and Agree with Plan of Care Patient             Patient will benefit from skilled therapeutic intervention in order to improve the following deficits and impairments:  Decreased endurance, Decreased activity tolerance, Decreased balance, Difficulty walking, Impaired flexibility, Impaired vision/preception, Decreased strength, Decreased mobility, Decreased coordination, Decreased scar mobility, Abnormal gait, Improper body mechanics, Pain, Increased muscle spasms, Postural dysfunction, Impaired perceived functional ability, Hypomobility, Decreased range of motion, Increased fascial restricitons  Visit  Diagnosis: Other abnormalities of gait and mobility  Abnormal posture  Other muscle spasm     Problem List Patient Active Problem List   Diagnosis Date Noted   Hyperlipidemia 08/06/2018   Coronary artery disease with exertional angina ([place]) 07/11/2017   CAD (coronary artery disease) 07/11/2017   Dyspnea 05/01/2016   Solitary pulmonary nodule 05/02/2015   Adverse effects of medication 02/14/2015   Cough 01/28/2015   Bronchiectasis without acute exacerbation ([place]) 01/28/2015   Shadow 01/25/2015   Ovarian ca ([place])    Osteopenia     Jerl Mina, PT 05/24/2021, 2:06 PM  [place] MAIN [facility] SERVICES [address] [place], [place], [address] Phone: [phone]   Fax:  [phone]  Name: Barbara Schwartz MRN:  [id number] Date of Birth: [birth year]

## 2021-05-25 ENCOUNTER — Other Ambulatory Visit: Payer: Self-pay

## 2021-05-25 ENCOUNTER — Ambulatory Visit (INDEPENDENT_AMBULATORY_CARE_PROVIDER_SITE_OTHER): Payer: Medicare Other | Admitting: Podiatry

## 2021-05-25 DIAGNOSIS — R52 Pain, unspecified: Secondary | ICD-10-CM | POA: Diagnosis not present

## 2021-05-25 DIAGNOSIS — Z7902 Long term (current) use of antithrombotics/antiplatelets: Secondary | ICD-10-CM | POA: Diagnosis not present

## 2021-05-25 DIAGNOSIS — L84 Corns and callosities: Secondary | ICD-10-CM | POA: Diagnosis not present

## 2021-05-25 NOTE — Patient Instructions (Signed)
Look for urea 40% cream or ointment and apply to the thickened dry skin / calluses. This can be bought over the counter, at a pharmacy or online such as [employer].  Aperture pads can be bought on [employer] as well or a specialty shoe / foot store

## 2021-05-29 NOTE — Progress Notes (Signed)
  Subjective:  Patient ID: Barbara Schwartz, female    DOB: 1945-08-05,  MRN: [id number]  Chief Complaint  Patient presents with   Foot Pain    Left foot pain , patient states foot pain is better , she would like her callus trim down some    75 y.o. female presents with the above complaint. History confirmed with patient.  She is planning to go to [place] hiking in January.  Lesion is very painful.  Objective:  Physical Exam: warm, good capillary refill, no trophic changes or ulcerative lesions, normal DP and PT pulses, and normal sensory exam. Left Foot: Submetatarsal 4 porokeratosis painful pressure   Assessment:   1. Callus of foot   2. Encounter for current long term use of antiplatelet drug   3. Pain      Plan:  Patient was evaluated and treated and all questions answered.  All symptomatic hyperkeratoses were safely debrided with a sterile #15 blade to patient's level of comfort without incident. We discussed preventative and palliative care of these lesions including supportive and accommodative shoegear, padding, prefabricated and custom molded accommodative orthoses, use of a pumice stone and lotions/creams daily.  Recommended offloading with aperture pads and/or using a pumice stone and urea cream daily.  Return to see me as needed for this or other issues  Return if symptoms worsen or fail to improve.

## 2021-05-30 ENCOUNTER — Ambulatory Visit
Admission: RE | Admit: 2021-05-30 | Discharge: 2021-05-30 | Disposition: A | Discharge status: Home / Self Care | Payer: Medicare Other | Source: Ambulatory Visit | Attending: Obstetrics & Gynecology | Admitting: Obstetrics & Gynecology

## 2021-05-30 ENCOUNTER — Ambulatory Visit: Payer: Medicare Other | Admitting: Physical Therapy

## 2021-05-30 ENCOUNTER — Other Ambulatory Visit: Payer: Self-pay

## 2021-05-30 DIAGNOSIS — R293 Abnormal posture: Secondary | ICD-10-CM

## 2021-05-30 DIAGNOSIS — Z803 Family history of malignant neoplasm of breast: Secondary | ICD-10-CM

## 2021-05-30 DIAGNOSIS — R2689 Other abnormalities of gait and mobility: Secondary | ICD-10-CM

## 2021-05-30 DIAGNOSIS — Z1239 Encounter for other screening for malignant neoplasm of breast: Secondary | ICD-10-CM | POA: Diagnosis not present

## 2021-05-30 DIAGNOSIS — M533 Sacrococcygeal disorders, not elsewhere classified: Secondary | ICD-10-CM | POA: Diagnosis not present

## 2021-05-30 DIAGNOSIS — M62838 Other muscle spasm: Secondary | ICD-10-CM | POA: Diagnosis not present

## 2021-05-30 MED ORDER — GADOBUTROL 1 MMOL/ML IV SOLN
5.0000 mL | Freq: Once | INTRAVENOUS | Status: AC | PRN
  Administered 2021-05-30: 5 mL via INTRAVENOUS

## 2021-05-30 NOTE — Patient Instructions (Signed)
Picture of bike at gym and you sitting in it Picture of your bike without you   __  Step b ack exericse 2 min x 2 sets per day   Front knee above ankle Back foot ( ski track hip width apart) , toes tucked, push off   Opp arm up in half V , thumbs up

## 2021-05-30 NOTE — Therapy (Addendum)
[place] MAIN [facility] SERVICES [address] [place], [place], [address] Phone: [phone]   Fax:  [phone]  Physical Therapy Treatment / Progress Note 10 visits from 03/23/21 to 05/30/21   Patient Details  Name: Barbara Schwartz MRN: [id number] Date of Birth: 10-Jan-1946 Referring Provider (PT): Fara Olden MD   Encounter Date: 05/30/2021   PT End of Session - 05/30/21 1042     Visit Number 10    Date for PT Re-Evaluation 08/08/21   eval 03/23/21, PN 05/30/21   PT Start Time 0900    PT Stop Time 1000    PT Time Calculation (min) 60 min    Activity Tolerance Patient tolerated treatment well;No increased pain    Behavior During Therapy [facility] for tasks assessed/performed             Past Medical History:  Diagnosis Date   Atypical nevus 01/05/1997   slight-mod- 3rd & 4th toe web Left foot -WS   BCC (basal cell carcinoma) 03/17/2004   Rigth lower inner leg (CX35Fu)   BCC (basal cell carcinoma) 12/03/2012   left upper back (CX35FU)   BCC (basal cell carcinoma) x 2 04/09/2006   right forearm (MOHS) left scalp (MOHS)   Bowen's disease x 3 08/26/2015   Left calf medial- (CX35FU) , Left calf sup. (CX35FU), Left calf inf. (CX35FU)   bowens 05/05/1996   lower right shin/outer side (CX35FU)   CHF (congestive heart failure) (HCC)    Coronary artery disease    Dyspnea    with exercise   Nodular basal cell carcinoma (BCC) 07/04/2018   left side lower leg (MOHS)   Osteopenia 02/2019   T score -1.9 FRAX 18% / 9.6%   Ovarian cancer ([place]) 1990   Stage 3   SCC (squamous cell carcinoma) 08/17/2003   left ear (CX35FU)   SCC (squamous cell carcinoma) 03/17/2004   right ant. shin (CX35FU)   SCC (squamous cell carcinoma) 09/10/2017   left lower shin   SCC (squamous cell carcinoma) x 2 12/03/2012   left outer calf (CX35FU) right cheek (CX35FU)   SCC (squamous cell carcinoma) x 2 06/29/2014   right cheek, lateral (MOHS) Right cheek,medial (MOHS)    SCC (squamous cell carcinoma) x 3 07/04/2018   back of crown, inf, Left lower leg (CX35FU), Right lower calf (CX35FU)   Squamous cell carcinoma of skin 11/02/2019   in situ on right lower leg, posterior - CX3+5FU   Squamous cell carcinoma of skin 11/02/2019   in situ on left lower leg, posterior - CX3+5FU   Superficial basal cell carcinoma (BCC) 05/05/1996   left right shin inner side (CX35FU)   Superficial basal cell carcinoma (BCC) 08/24/2014   right submandibular (Dr Aundra Millet bx'd )    Past Surgical History:  Procedure Laterality Date   ABDOMINAL HYSTERECTOMY  1990   TAH BSO   Bowel obstruction     BREAST BIOPSY Right 11/27/2002   CARDIAC CATHETERIZATION  07/11/2017   INTRAVASCULAR PRESSURE WIRE/FFR STUDY N/A 07/11/2017   Procedure: INTRAVASCULAR PRESSURE WIRE/FFR STUDY;  Surgeon: Sherren Mocha, MD;  Location: [facility];  Service: Cardiovascular;  Laterality: N/A;   OOPHORECTOMY     BSO   RIGHT/LEFT HEART CATH AND CORONARY ANGIOGRAPHY N/A 07/11/2017   Procedure: RIGHT/LEFT HEART CATH AND CORONARY ANGIOGRAPHY;  Surgeon: Sherren Mocha, MD;  Location: [place] [facility];  Service: Cardiovascular;  Laterality: N/A;   Squamous cell and Basal cell excision     VIDEO BRONCHOSCOPY  Bilateral 02/15/2015   Procedure: VIDEO BRONCHOSCOPY WITH FLUORO;  Surgeon: Juanito Doom, MD;  Location: [facility];  Service: Cardiopulmonary;  Laterality: Bilateral;    There were no vitals filed for this visit.   Subjective Assessment - 05/30/21 0907     Subjective Pt reported overall, everything feels good. Pt has signed up for a trip of hiking and biking at [facility] in June 2023. Pt reports no more shapr neck pain since last session. Pt only feels soreness.    Pertinent History Hx of ovarian CA with Tx of hysterectomy, chemotherapy,1990, Skin squamous/basal cell carcinoma removal at top of head, face, legs,arms, eye lid, CAD, SOB with exercises, 1 vaginal deliveries, Side effects  from chemotherapy: loss of hearing ( wears hearing aids B), neuropathy of feet and hands. Hx of ankle sprains in B, Hx of falls x 3 in the past 6 months ( 6-7 falls in one year).  Physical activities: Prior to getting COVID 02/2021, pt used to play tennis 3 x week, yoga, spin classes, 3 miles. Long COVID Sx include: dry cough and tiredness. Pt noticed her stamina has not returned with playing tennis by the last set in 90 min periods compared to being able to play for 2 -2.5 hours.    Patient Stated Goals work this pain and be back on the bike                [facility] PT Assessment - 05/30/21 0909       Coordination   Coordination and Movement Description rotation of thoracic, then cervical , more segmental motion      AROM   Overall AROM Comments sideflexion cervical Sideflexion pre/Post Tx:R 35/ 32 deg, L 25/ 30 deg,  rotation 45 deg B      Palpation   Spinal mobility tightness along L intercostals T7-8,  interspinal mm,      Bed Mobility   Bed Mobility --                           [facility] Adult PT Treatment/Exercise - 05/30/21 0909       Therapeutic Activites    Other Therapeutic Activities discussed bike fitting and progression to stationery bike, explained the importance of strengthening posterior chain for maintaining proper posture      Neuro Re-ed    Neuro Re-ed Details  cued for new HEP , propioception and technique      Modalities   Modalities Moist Heat      Moist Heat Therapy   Number Minutes Moist Heat 5 Minutes    Moist Heat Location --   R thoracic spine, provided education about bike fitting and progression to stationery bike     Manual Therapy   Manual therapy comments STM/MWM at at areas noted in assessment to promote thoracic and cervical mobility, thoracic extensions                          PT Long Term Goals - 05/24/21 1212       PT LONG TERM GOAL #1   Title Pt will report decreased pain from 4/10 to  < 2/10 after 15  min of sitting on clinic soft chair and being able to sit on hard chairs with 50% less pain inorder to sit incomunity settings    Time 6    Period Weeks    Status Achieved      PT LONG  TERM GOAL #2   Title Pt will have no flareups of pain in R buttock after walking on pavement for 30 min    Time 8    Period Weeks    Status Achieved      PT LONG TERM GOAL #3   Title Pt will be able ride a bike in spin class for 25 min and be compliant stretches and HEP in order to return to biking stationery bike    Time 10    Period Weeks    Status On-going      PT LONG TERM GOAL #4   Title Pt will demo no trunk lean and more levelled pelvis and shoulders, and space between rib and iliac crest will be equal in order to minimize pain with sitting and walking and biking  and progress to deep core exercises to maintain alignment    Baseline 3 fingers on R flank, 4 fingers L flank between lowest rib and iliac crest    Time 2    Period Weeks    Status Achieved      PT LONG TERM GOAL #5   Title Pt will improve her FOTO score from 57 pts to  > 67 pts inorder to demonstrate improved functional mobility  ( plan to administer at 2nd session)    Baseline 57 pts    Time 10    Period Weeks    Status On-going      Additional Long Term Goals   Additional Long Term Goals Yes      PT LONG TERM GOAL #6   Title Pt will demonstrate decreased scar restrictions along abdominal scar in order to progress to more strengthening exercises for trunk stability for decreased risk of falls    Time 5    Period Weeks    Status Partially Met      PT LONG TERM GOAL #7   Title Pt will demo increased R hip abduction strength from 2/5 to > 4/5 and no tenderness at ischial tuberosity and glut med in order to improve pelvic stability for balance and mounting a bike.    Time 8    Period Weeks    Status On-going      PT LONG TERM GOAL #8   Title Pt will increase cervical mobility to look over shoulder when biking in order to  minimize falling off bike and maintaining balance while walking    Baseline cervical rotation R 30 deg, L 40 deg,  flexion R 25 deg, L 30 deg,    Status On-going      PT LONG TERM GOAL  #9   TITLE Pt will increase cervical mm endurance test time from  2 min, 48 sec  -> > 3 min in order to minimize neck pain and demo no more forward head posture / throacic kyphosis    Time 4    Period Weeks    Status New    Target Date 06/21/21                   Plan - 05/30/21 1044     Clinical Impression Statement Pt has achieved 3/9 goals and progressing well towards remaining goals. Pt has reported no R glut pain for one month and is able to sit on soft and hard chairs without pain for longer periods of time. Pt's sharp neck pain is also resolving and thus, the concern for further imaging is no longer needed. Pt's spinal curves and deviations/ pelvic  misalignment , and gait pattern have been corrected and pt's forward head / thoracic kyphosis is improving greatly. Pt is regaining cervical mobility with segmental mobility initiated at thoracic spine. Pt's deep core mm strength and coordination is improving to maintain this trunk control. Currently working on improving balance, posterior back and BLE strengthen to prepare pt to return to biking and hiking [place] which will be involved in  her out of country nature trips scheduled for next year.   Pt continues to benefit from skilled PT.    Personal Factors and Comorbidities Comorbidity 3+    Comorbidities Hx of ovarian CA with Tx of hysterectomy, chemotherapy,1990, Skin squamous/basal cell carcinoma removal at top of head, face, legs,arms, eye lid, CAD, SOB with exercises, 1 vaginal deliveries, Side effects from chemotherapy: loss of hearing ( wears hearing aids B), neuropathy of feet and hands. Hx of ankle sprains in B, Hx of falls x 3 in the past 6 months ( 6-7 falls in one year). Physical activities: Prior to getting COVID 02/2021, pt used to play  tennis 3 x week, yoga, spin classes, 3 miles. Long COVID Sx include: dry coough and tiredness. Pt noticed her stamina has not returned with playing tennis by the last set in 90 min periods compared to being able to play for 2 -2.5 hours    Examination-Activity Limitations Locomotion Level;Other    Stability/Clinical Decision Making Evolving/Moderate complexity    Rehab Potential Good    PT Frequency 1x / week    PT Duration Other (comment)   10   PT Treatment/Interventions Gait training;Stair training;Neuromuscular re-education;Moist Heat;Traction;Therapeutic activities;Therapeutic exercise;Patient/family education;Manual techniques;Energy conservation;Splinting;Taping;Cryotherapy;Balance training;ADLs/Self Care Home Management;Scar mobilization;Spinal Manipulations;Joint Manipulations;Functional mobility training    Consulted and Agree with Plan of Care Patient             Patient will benefit from skilled therapeutic intervention in order to improve the following deficits and impairments:  Decreased endurance, Decreased activity tolerance, Decreased balance, Difficulty walking, Impaired flexibility, Impaired vision/preception, Decreased strength, Decreased mobility, Decreased coordination, Decreased scar mobility, Abnormal gait, Improper body mechanics, Pain, Increased muscle spasms, Postural dysfunction, Impaired perceived functional ability, Hypomobility, Decreased range of motion, Increased fascial restricitons  Visit Diagnosis: Other abnormalities of gait and mobility - Plan: PT plan of care cert/re-cert  Abnormal posture - Plan: PT plan of care cert/re-cert  Other muscle spasm - Plan: PT plan of care cert/re-cert     Problem List Patient Active Problem List   Diagnosis Date Noted   Hyperlipidemia 08/06/2018   Coronary artery disease with exertional angina (HCC) 07/11/2017   CAD (coronary artery disease) 07/11/2017   Dyspnea 05/01/2016   Solitary pulmonary nodule 05/02/2015    Adverse effects of medication 02/14/2015   Cough 01/28/2015   Bronchiectasis without acute exacerbation ([place]) 01/28/2015   Shadow 01/25/2015   Ovarian ca ([place])    Osteopenia     Jerl Mina, PT 05/30/2021, 5:53 PM  [place] MAIN [facility] SERVICES [address] [place], [place], [address] Phone: [phone]   Fax:  [phone]  Name: Barbara Schwartz MRN: [id number] Date of Birth: 08/03/1945

## 2021-06-06 ENCOUNTER — Encounter: Payer: Medicare Other | Admitting: Physical Therapy

## 2021-06-13 ENCOUNTER — Ambulatory Visit: Payer: Medicare Other | Admitting: Physical Therapy

## 2021-06-13 ENCOUNTER — Other Ambulatory Visit: Payer: Self-pay

## 2021-06-13 DIAGNOSIS — R293 Abnormal posture: Secondary | ICD-10-CM

## 2021-06-13 DIAGNOSIS — M62838 Other muscle spasm: Secondary | ICD-10-CM | POA: Diagnosis not present

## 2021-06-13 DIAGNOSIS — M533 Sacrococcygeal disorders, not elsewhere classified: Secondary | ICD-10-CM | POA: Diagnosis not present

## 2021-06-13 DIAGNOSIS — R2689 Other abnormalities of gait and mobility: Secondary | ICD-10-CM

## 2021-06-13 NOTE — Therapy (Addendum)
[place] MAIN [facility] SERVICES [address] [place], [place], [address] Phone: [phone]   Fax:  [phone]  Physical Therapy Treatment  Patient Details  Name: Barbara Schwartz MRN: [id number] Date of Birth: 1946-02-23 Referring Provider (PT): Fara Olden MD   Encounter Date: 06/13/2021   PT End of Session - 06/13/21 0908     Visit Number 11    Date for PT Re-Evaluation 08/08/21   eval 03/23/21, PN 05/30/21   PT Start Time 0900    PT Stop Time 1000    PT Time Calculation (min) 60 min    Activity Tolerance Patient tolerated treatment well;No increased pain    Behavior During Therapy [facility] for tasks assessed/performed             Past Medical History:  Diagnosis Date   Atypical nevus 01/05/1997   slight-mod- 3rd & 4th toe web Left foot -WS   BCC (basal cell carcinoma) 03/17/2004   Rigth lower inner leg (CX35Fu)   BCC (basal cell carcinoma) 12/03/2012   left upper back (CX35FU)   BCC (basal cell carcinoma) x 2 04/09/2006   right forearm (MOHS) left scalp (MOHS)   Bowen's disease x 3 08/26/2015   Left calf medial- (CX35FU) , Left calf sup. (CX35FU), Left calf inf. (CX35FU)   bowens 05/05/1996   lower right shin/outer side (CX35FU)   CHF (congestive heart failure) (HCC)    Coronary artery disease    Dyspnea    with exercise   Nodular basal cell carcinoma (BCC) 07/04/2018   left side lower leg (MOHS)   Osteopenia 02/2019   T score -1.9 FRAX 18% / 9.6%   Ovarian cancer ([place]) 1990   Stage 3   SCC (squamous cell carcinoma) 08/17/2003   left ear (CX35FU)   SCC (squamous cell carcinoma) 03/17/2004   right ant. shin (CX35FU)   SCC (squamous cell carcinoma) 09/10/2017   left lower shin   SCC (squamous cell carcinoma) x 2 12/03/2012   left outer calf (CX35FU) right cheek (CX35FU)   SCC (squamous cell carcinoma) x 2 06/29/2014   right cheek, lateral (MOHS) Right cheek,medial (MOHS)   SCC (squamous cell carcinoma) x 3 07/04/2018    back of crown, inf, Left lower leg (CX35FU), Right lower calf (CX35FU)   Squamous cell carcinoma of skin 11/02/2019   in situ on right lower leg, posterior - CX3+5FU   Squamous cell carcinoma of skin 11/02/2019   in situ on left lower leg, posterior - CX3+5FU   Superficial basal cell carcinoma (BCC) 05/05/1996   left right shin inner side (CX35FU)   Superficial basal cell carcinoma (BCC) 08/24/2014   right submandibular (Dr Aundra Millet bx'd )    Past Surgical History:  Procedure Laterality Date   ABDOMINAL HYSTERECTOMY  1990   TAH BSO   Bowel obstruction     BREAST BIOPSY Right 11/27/2002   CARDIAC CATHETERIZATION  07/11/2017   INTRAVASCULAR PRESSURE WIRE/FFR STUDY N/A 07/11/2017   Procedure: INTRAVASCULAR PRESSURE WIRE/FFR STUDY;  Surgeon: Sherren Mocha, MD;  Location: [place] CV LAB;  Service: Cardiovascular;  Laterality: N/A;   OOPHORECTOMY     BSO   RIGHT/LEFT HEART CATH AND CORONARY ANGIOGRAPHY N/A 07/11/2017   Procedure: RIGHT/LEFT HEART CATH AND CORONARY ANGIOGRAPHY;  Surgeon: Sherren Mocha, MD;  Location: [place] [facility];  Service: Cardiovascular;  Laterality: N/A;   Squamous cell and Basal cell excision     VIDEO BRONCHOSCOPY Bilateral 02/15/2015   Procedure: VIDEO BRONCHOSCOPY WITH FLUORO;  Surgeon: Juanito Doom, MD;  Location: [place];  Service: Cardiopulmonary;  Laterality: Bilateral;    There were no vitals filed for this visit.   Subjective Assessment - 06/13/21 0906     Subjective Pt reports she wakes up with neck stiffness and no longer has pain that wakes her up. Cracking sounds are gone in the neck. Hip and buttock has been resolved for a month. Pt is able to sit on hard surfaces.    Pertinent History Hx of ovarian CA with Tx of hysterectomy, chemotherapy,1990, Skin squamous/basal cell carcinoma removal at top of head, face, legs,arms, eye lid,  CAD, SOB with exercises, 1 vaginal deliveries, Side effects from chemotherapy: loss of hearing ( wears  hearing aids B), neuropathy of feet and hands. Hx of ankle sprains in B, Hx of falls x 3 in the past 6 months ( 6-7 falls in one year).  Physical activities: Prior to getting COVID 02/2021, pt used to play tennis 3 x week, yoga, spin classes, 3 miles. Long COVID Sx include: dry cough and tiredness. Pt noticed her stamina has not returned with playing tennis by the last set in 90 min periods compared to being able to play for 2 -2.5 hours.    Patient Stated Goals work this pain and be back on the bike                [facility] PT Assessment - 06/13/21 0914       AROM   Overall AROM Comments sideflexion cervical Sideflexion R 40 deg/ L 30 deg,  rotation 30 deg B      Strength   Overall Strength Comments R hip/knee flex/ext, DF/EV 3/5, L 4/5,  R hip abd 2/5, R hip ext 3/5, L hip abd/ext 4-/5, ( post Tx: hip abd R 3-/5,  hip ext 3+/5)      Palpation   SI assessment  hypomobile R SIJ, limited nutation, concordant pain at level of S2/3 at sacral border and ischail rami attachments ( obt int/ext) R,                           [facility] Adult PT Treatment/Exercise - 06/13/21 1352       Therapeutic Activites    Other Therapeutic Activities explained the need to strengthen R LE before advancing to stationery bike      Neuro Re-ed    Neuro Re-ed Details  cued for SIJ mobility, SLS balance , propioception, peosterior pelvic floor stretches      Modalities   Modalities Moist Heat      Moist Heat Therapy   Number Minutes Moist Heat 5 Minutes    Moist Heat Location Other (comment)   gluts in prone ( not billed)     Manual Therapy   Manual therapy comments long axis distraction at RLE< PA mob Grade II-III at sacral border, STM/MWM at coccygeus, sacrotuberous/spinous ligament, hip abductor attachments at lateral border of R sacrum,                          PT Long Term Goals - 06/13/21 0913       PT LONG TERM GOAL #1   Title Pt will report decreased pain from 4/10  to  < 2/10 after 15 min of sitting on clinic soft chair and being able to sit on hard chairs with 50% less pain inorder to sit incomunity settings  Time 6    Period Weeks    Status Achieved      PT LONG TERM GOAL #2   Title Pt will have no flareups of pain in R buttock after walking on pavement for 30 min    Time 8    Period Weeks    Status Achieved      PT LONG TERM GOAL #3   Title Pt will be able ride a bike in spin class for 25 min and be compliant stretches and HEP in order to return to biking stationery bike    Time 10    Period Weeks    Status On-going      PT LONG TERM GOAL #4   Title Pt will demo no trunk lean and more levelled pelvis and shoulders, and space between rib and iliac crest will be equal in order to minimize pain with sitting and walking and biking  and progress to deep core exercises to maintain alignment    Baseline 3 fingers on R flank, 4 fingers L flank between lowest rib and iliac crest    Time 2    Period Weeks    Status Achieved      PT LONG TERM GOAL #5   Title Pt will improve her FOTO score from 57 pts to  > 67 pts inorder to demonstrate improved functional mobility  ( 06/13/21: 79 pts)    Baseline 57 pts    Time 10    Period Weeks    Status Achieved      PT LONG TERM GOAL #6   Title Pt will demonstrate decreased scar restrictions along abdominal scar in order to progress to more strengthening exercises for trunk stability for decreased risk of falls    Time 5    Period Weeks    Status Partially Met      PT LONG TERM GOAL #7   Title Pt will demo increased R hip abduction strength from 2/5 to > 4/5 and no tenderness at ischial tuberosity and glut med in order to improve pelvic stability for balance and mounting a bike.    Time 8    Period Weeks    Status On-going      PT LONG TERM GOAL #8   Title Pt will increase cervical mobility to look over shoulder when biking in order to minimize falling off bike and maintaining balance while walking     Baseline cervical rotation R 30 deg, L 40 deg,  flexion R 25 deg, L 30 deg,    Status On-going      PT LONG TERM GOAL  #9   TITLE Pt will increase cervical mm endurance test time from  2 min, 48 sec  -> > 3 min in order to minimize neck pain and demo no more forward head posture / throacic kyphosis    Time 4    Period Weeks    Status New                   Plan - 06/13/21 1355     Clinical Impression Statement Pt continues to experience no pain with sitting and has not had R buttock pain for a month. Neck stiffness occurs in the morning but neck pain does not occur.   Pt showed improved segmental movement of thoracic spine preceding cervical spine movement which is an improvement from past sessions and will be needed for safe biking and driving.   Assessed pt's readiness to integrate to  stationery bike which will help pt gradually return to biking. However, pt showed RLE weakness compared to LLE , SIJ hypomobility with lack of nutation/ coccyx extension on R, and had concordant pain at R SIJ/posterior pelvic floor mm. Post Tx, pt showed improved SIJ /coccyx mobility and decreased mm tensions with less tenderness at problem spots. Hip abduction on R improved slightly in strength after Tx  but still warrants more manual Tx and further assessment at next session. Withholding calm shell exercise due to concordant pain after performing 5 reps.   Plan to give  pt pelvic floor / deep core  approaches   Tx given pt's abdominal surgeries, poor posture, and spinal deviations. Pt demo'd improved pelvic tilts which will help pt achieve more anterior tilt of pelvis which is needed for biking. Plan to continue with SLS strengthening and balance training which is needed to mount a bike. Pt has a biking trip planned for next year in July.  Anticipate pt will be able to progress towards biking by next summer  Pt continues to benefit from skilled PT.    Personal Factors and Comorbidities Comorbidity 3+     Comorbidities Hx of ovarian CA with Tx of hysterectomy, chemotherapy,1990, Skin squamous/basal cell carcinoma removal at top of head, face, legs,arms, eye lid, CAD, SOB with exercises, 1 vaginal deliveries, Side effects from chemotherapy: loss of hearing ( wears hearing aids B), neuropathy of feet and hands. Hx of ankle sprains in B, Hx of falls x 3 in the past 6 months ( 6-7 falls in one year). Physical activities: Prior to getting COVID 02/2021, pt used to play tennis 3 x week, yoga, spin classes, 3 miles. Long COVID Sx include: dry coough and tiredness. Pt noticed her stamina has not returned with playing tennis by the last set in 90 min periods compared to being able to play for 2 -2.5 hours    Examination-Activity Limitations Locomotion Level;Other    Stability/Clinical Decision Making Evolving/Moderate complexity    Rehab Potential Good    PT Frequency 1x / week    PT Duration Other (comment)   10   PT Treatment/Interventions Gait training;Stair training;Neuromuscular re-education;Moist Heat;Traction;Therapeutic activities;Therapeutic exercise;Patient/family education;Manual techniques;Energy conservation;Splinting;Taping;Cryotherapy;Balance training;ADLs/Self Care Home Management;Scar mobilization;Spinal Manipulations;Joint Manipulations;Functional mobility training    Consulted and Agree with Plan of Care Patient             Patient will benefit from skilled therapeutic intervention in order to improve the following deficits and impairments:  Decreased endurance, Decreased activity tolerance, Decreased balance, Difficulty walking, Impaired flexibility, Impaired vision/preception, Decreased strength, Decreased mobility, Decreased coordination, Decreased scar mobility, Abnormal gait, Improper body mechanics, Pain, Increased muscle spasms, Postural dysfunction, Impaired perceived functional ability, Hypomobility, Decreased range of motion, Increased fascial restricitons  Visit  Diagnosis: Other abnormalities of gait and mobility  Abnormal posture  Other muscle spasm     Problem List Patient Active Problem List   Diagnosis Date Noted   Hyperlipidemia 08/06/2018   Coronary artery disease with exertional angina ([place]) 07/11/2017   CAD (coronary artery disease) 07/11/2017   Dyspnea 05/01/2016   Solitary pulmonary nodule 05/02/2015   Adverse effects of medication 02/14/2015   Cough 01/28/2015   Bronchiectasis without acute exacerbation ([place]) 01/28/2015   Shadow 01/25/2015   Ovarian ca ([place])    Osteopenia     Jerl Mina, PT 06/13/2021, 1:58 PM  [place] [facility] MAIN [facility] SERVICES [place], [place], [address] Phone: [phone]   Fax:  [phone]  Name: Barbara Schwartz MRN: [id number] Date of Birth: November 08, 1945

## 2021-06-13 NOTE — Patient Instructions (Addendum)
     On belly: Riding horse edge of mattress  knee bent like riding a horse, move knee towards armpit and out  10 reps    3- foot tap  10 reps  Each side   Hold onto wall   Slightly bend of standing knee, and keep hips above foot   ballmound of opposite leg   taps to each direction and   back to spot under hips- notice equal pressure through both legs, and across ballmound and heels    ___

## 2021-06-19 ENCOUNTER — Encounter: Payer: Self-pay | Admitting: Dermatology

## 2021-06-19 ENCOUNTER — Ambulatory Visit (INDEPENDENT_AMBULATORY_CARE_PROVIDER_SITE_OTHER): Payer: Medicare Other | Admitting: Dermatology

## 2021-06-19 ENCOUNTER — Other Ambulatory Visit: Payer: Self-pay

## 2021-06-19 ENCOUNTER — Other Ambulatory Visit: Payer: Self-pay | Admitting: Dermatology

## 2021-06-19 DIAGNOSIS — Z8589 Personal history of malignant neoplasm of other organs and systems: Secondary | ICD-10-CM

## 2021-06-19 DIAGNOSIS — C44719 Basal cell carcinoma of skin of left lower limb, including hip: Secondary | ICD-10-CM | POA: Diagnosis not present

## 2021-06-19 DIAGNOSIS — D044 Carcinoma in situ of skin of scalp and neck: Secondary | ICD-10-CM | POA: Diagnosis not present

## 2021-06-19 DIAGNOSIS — I2510 Atherosclerotic heart disease of native coronary artery without angina pectoris: Secondary | ICD-10-CM

## 2021-06-19 DIAGNOSIS — Z85828 Personal history of other malignant neoplasm of skin: Secondary | ICD-10-CM | POA: Diagnosis not present

## 2021-06-19 DIAGNOSIS — L570 Actinic keratosis: Secondary | ICD-10-CM | POA: Diagnosis not present

## 2021-06-19 DIAGNOSIS — B354 Tinea corporis: Secondary | ICD-10-CM

## 2021-06-19 LAB — POCT SKIN KOH

## 2021-06-19 MED ORDER — TRIAMCINOLONE ACETONIDE 0.1 % EX CREA: 1.0000 "application " | TOPICAL_CREAM | Freq: Every day | CUTANEOUS | 0 refills | Status: AC | PRN

## 2021-06-19 NOTE — Patient Instructions (Addendum)
Culture on buttocks will take 4 weeks      Biopsy, Surgery (Curettage) & Surgery (Excision) Aftercare Instructions  1. Okay to remove bandage in 24 hours  2. Wash area with soap and water  3. Apply Vaseline to area twice daily until healed (Not Neosporin)  4. Okay to cover with a Band-Aid to decrease the chance of infection or prevent irritation from clothing; also it's okay to uncover lesion at home.  5. Suture instructions: return to our office in 7-10 or 10-14 days for a nurse visit for suture removal. Variable healing with sutures, if pain or itching occurs call our office. It's okay to shower or bathe 24 hours after sutures are given.  6. The following risks may occur after a biopsy, curettage or excision: bleeding, scarring, discoloration, recurrence, infection (redness, yellow drainage, pain or swelling).  7. For questions, concerns and results call our office at [place] before 4pm & Friday before 3pm. Biopsy results will be available in 1 week.

## 2021-06-20 ENCOUNTER — Encounter: Payer: Medicare Other | Admitting: Physical Therapy

## 2021-06-20 DIAGNOSIS — Z23 Encounter for immunization: Secondary | ICD-10-CM | POA: Diagnosis not present

## 2021-06-21 ENCOUNTER — Telehealth: Payer: Self-pay | Admitting: *Deleted

## 2021-06-21 NOTE — Telephone Encounter (Signed)
Path to patient. No surgery needed at this time. Patient will call if there is any residual crusting and Dr.Tafeen will prescribe aldera.

## 2021-06-27 ENCOUNTER — Ambulatory Visit: Payer: Medicare Other | Attending: Orthopaedic Surgery | Admitting: Physical Therapy

## 2021-06-27 ENCOUNTER — Other Ambulatory Visit: Payer: Self-pay

## 2021-06-27 DIAGNOSIS — M62838 Other muscle spasm: Secondary | ICD-10-CM | POA: Diagnosis not present

## 2021-06-27 DIAGNOSIS — R2689 Other abnormalities of gait and mobility: Secondary | ICD-10-CM | POA: Insufficient documentation

## 2021-06-27 DIAGNOSIS — R293 Abnormal posture: Secondary | ICD-10-CM | POA: Insufficient documentation

## 2021-06-27 DIAGNOSIS — M533 Sacrococcygeal disorders, not elsewhere classified: Secondary | ICD-10-CM | POA: Diagnosis not present

## 2021-06-27 NOTE — Patient Instructions (Addendum)
Modified figure 4 in 45 deg turn at the chair / bed  Calf/ leg is on the chair/ bed   To stretch pelvic floor and hips   5 breaths   __  Sit to stand, knees wide, more weight on across ballmounds not just heels  __  Ardine Eng pose stretching at kitchen counter during the day to stretch pelvic floor     __  Turn segmentally in open book, Relax shoulders down  Rotate ribs first, then the heart, , then the head , rest briefly on the pillow behind your back  after 3/ 4 turn

## 2021-06-28 NOTE — Therapy (Addendum)
[place] MAIN [facility] SERVICES [address] [place], [place], [address] Phone: [phone]   Fax:  [phone]  Physical Therapy Treatment  Patient Details  Name: Barbara Schwartz MRN: [id number] Date of Birth: Aug 12, 1945 Referring Provider (PT): Fara Olden MD   Encounter Date: 06/27/2021   PT End of Session - 06/27/21 0914     Visit Number 12    Date for PT Re-Evaluation 08/08/21   eval 03/23/21, PN 05/30/21   PT Start Time 0900    PT Stop Time 1020    PT Time Calculation (min) 80 min    Activity Tolerance Patient tolerated treatment well;No increased pain    Behavior During Therapy [facility] for tasks assessed/performed             Past Medical History:  Diagnosis Date   Atypical nevus 01/05/1997   slight-mod- 3rd & 4th toe web Left foot -WS   BCC (basal cell carcinoma) 03/17/2004   Rigth lower inner leg (CX35Fu)   BCC (basal cell carcinoma) 12/03/2012   left upper back (CX35FU)   BCC (basal cell carcinoma) x 2 04/09/2006   right forearm (MOHS) left scalp (MOHS)   Bowen's disease x 3 08/26/2015   Left calf medial- (CX35FU) , Left calf sup. (CX35FU), Left calf inf. (CX35FU)   bowens 05/05/1996   lower right shin/outer side (CX35FU)   CHF (congestive heart failure) (HCC)    Coronary artery disease    Dyspnea    with exercise   Nodular basal cell carcinoma (BCC) 07/04/2018   left side lower leg (MOHS)   Osteopenia 02/2019   T score -1.9 FRAX 18% / 9.6%   Ovarian cancer ([place]) 1990   Stage 3   SCC (squamous cell carcinoma) 08/17/2003   left ear (CX35FU)   SCC (squamous cell carcinoma) 03/17/2004   right ant. shin (CX35FU)   SCC (squamous cell carcinoma) 09/10/2017   left lower shin   SCC (squamous cell carcinoma) x 2 12/03/2012   left outer calf (CX35FU) right cheek (CX35FU)   SCC (squamous cell carcinoma) x 2 06/29/2014   right cheek, lateral (MOHS) Right cheek,medial (MOHS)   SCC (squamous cell carcinoma) x 3 07/04/2018    back of crown, inf, Left lower leg (CX35FU), Right lower calf (CX35FU)   Squamous cell carcinoma of skin 11/02/2019   in situ on right lower leg, posterior - CX3+5FU   Squamous cell carcinoma of skin 11/02/2019   in situ on left lower leg, posterior - CX3+5FU   Superficial basal cell carcinoma (BCC) 05/05/1996   left right shin inner side (CX35FU)   Superficial basal cell carcinoma (BCC) 08/24/2014   right submandibular (Dr Aundra Millet bx'd )    Past Surgical History:  Procedure Laterality Date   ABDOMINAL HYSTERECTOMY  1990   TAH BSO   Bowel obstruction     BREAST BIOPSY Right 11/27/2002   CARDIAC CATHETERIZATION  07/11/2017   INTRAVASCULAR PRESSURE WIRE/FFR STUDY N/A 07/11/2017   Procedure: INTRAVASCULAR PRESSURE WIRE/FFR STUDY;  Surgeon: Sherren Mocha, MD;  Location: [place] [facility];  Service: Cardiovascular;  Laterality: N/A;   OOPHORECTOMY     BSO   RIGHT/LEFT HEART CATH AND CORONARY ANGIOGRAPHY N/A 07/11/2017   Procedure: RIGHT/LEFT HEART CATH AND CORONARY ANGIOGRAPHY;  Surgeon: Sherren Mocha, MD;  Location: [place] [facility];  Service: Cardiovascular;  Laterality: N/A;   Squamous cell and Basal cell excision     VIDEO BRONCHOSCOPY Bilateral 02/15/2015   Procedure: VIDEO BRONCHOSCOPY WITH FLUORO;  Surgeon: Juanito Doom, MD;  Location: [place];  Service: Cardiopulmonary;  Laterality: Bilateral;    There were no vitals filed for this visit.   Subjective Assessment - 06/27/21 0910     Subjective Pt felt sore at the R glut area after last session 2 weeks ago. The soreness went away. Her neck is on and off with low grade pain 3-4/10. Turning head is easier with more range of motion. ONe of her tennis playmatessssss noticed she has not been falling during the games compared to always falling when playing tennis and other times too. Pt has not had a fall since starting PT in Sept. She feels stronger and when she feels she is about fall, she is able to self-correct.     Pertinent History Hx of ovarian CA with Tx of hysterectomy, chemotherapy,1990,  Skin squamous/basal cell carcinoma removal at top of head, face, legs,arms, eye lid,   CAD, SOB with exercises, 1 vaginal deliveries, Side effects from chemotherapy: loss of hearing ( wears hearing aids B), neuropathy of feet and hands. Hx of ankle sprains in B, Hx of falls x 3 in the past 6 months ( 6-7 falls in one year).  Physical activities: Prior to getting COVID 02/2021, pt used to play tennis 3 x week, yoga, spin classes, 3 miles. Long COVID Sx include: dry cough and tiredness. Pt noticed her stamina has not returned with playing tennis by the last set in 90 min periods compared to being able to play for 2 -2.5 hours.    Patient Stated Goals work this pain and be back on the bike                [facility] PT Assessment - 06/28/21 0806       Observation/Other Assessments   Observations shakiness in  R leg after Tx, no shakiness after sitting down 10 min, guided relaxation practice    Cranial Nerve(s) R iliac / shoulder higher  ( post Tx: levelled pelvic girdle_)      Strength   Overall Strength Comments hip abd 2/5 R, pain with palpation at R SIJ, less tenderness at R SIJ, more tenderness at posterior pelvic floor      Palpation   Spinal mobility hypomobility T9-12, L interspinal tightness at this level    SI assessment  tightness / tenderness at R obt int, traverse perineal superficial/ deep / coccygeus                           [facility] Adult PT Treatment/Exercise - 06/28/21 0806       Therapeutic Activites    Other Therapeutic Activities explained breathing practice calming after mm releases, advised epson salt bath and water intake to minimize soreness of mm and optimize tissue hydration and mm mobility post treatment      Neuro Re-ed    Neuro Re-ed Details  cued for modified figure-4 stretch, slowed breathing to slow breath and promote relaxation      Modalities   Modalities Moist  Heat      Moist Heat Therapy   Moist Heat Location --   thoracic/lumbar     Manual Therapy   Manual therapy comments STM/MWM at intercostals L T9-10,  interspinal L T/L junction, mobilizations Grade II with MWM at  T9-12, Obt int R  PT Long Term Goals - 06/13/21 0913       PT LONG TERM GOAL #1   Title Pt will report decreased pain from 4/10 to  < 2/10 after 15 min of sitting on clinic soft chair and being able to sit on hard chairs with 50% less pain inorder to sit incomunity settings    Time 6    Period Weeks    Status Achieved      PT LONG TERM GOAL #2   Title Pt will have no flareups of pain in R buttock after walking on pavement for 30 min    Time 8    Period Weeks    Status Achieved      PT LONG TERM GOAL #3   Title Pt will be able ride a bike in spin class for 25 min and be compliant stretches and HEP in order to return to biking stationery bike    Time 10    Period Weeks    Status On-going      PT LONG TERM GOAL #4   Title Pt will demo no trunk lean and more levelled pelvis and shoulders, and space between rib and iliac crest will be equal in order to minimize pain with sitting and walking and biking  and progress to deep core exercises to maintain alignment    Baseline 3 fingers on R flank, 4 fingers L flank between lowest rib and iliac crest    Time 2    Period Weeks    Status Achieved      PT LONG TERM GOAL #5   Title Pt will improve her FOTO score from 57 pts to  > 67 pts inorder to demonstrate improved functional mobility  ( 06/13/21: 79 pts)    Baseline 57 pts    Time 10    Period Weeks    Status Achieved      PT LONG TERM GOAL #6   Title Pt will demonstrate decreased scar restrictions along abdominal scar in order to progress to more strengthening exercises for trunk stability for decreased risk of falls    Time 5    Period Weeks    Status Partially Met      PT LONG TERM GOAL #7   Title Pt will demo increased R  hip abduction strength from 2/5 to > 4/5 and no tenderness at ischial tuberosity and glut med in order to improve pelvic stability for balance and mounting a bike.    Time 8    Period Weeks    Status On-going      PT LONG TERM GOAL #8   Title Pt will increase cervical mobility to look over shoulder when biking in order to minimize falling off bike and maintaining balance while walking    Baseline cervical rotation R 30 deg, L 40 deg,  flexion R 25 deg, L 30 deg,    Status On-going      PT LONG TERM GOAL  #9   TITLE Pt will increase cervical mm endurance test time from  2 min, 48 sec  -> > 3 min in order to minimize neck pain and demo no more forward head posture / throacic kyphosis    Time 4    Period Weeks    Status New                    Plan - 06/27/21 0911     Clinical Impression Statement Pt has not had a fall  since starting PT in Sept. She feels stronger and when she feels she is about fall, she is able to self-correct.   Pt showed less posteriorly set ilia  on L compared to last session which is good cary over from last session.  Continued to perform more manual Tx at R SIJ because pt continued to report Posterior glut pain after 12 reps of clamshell exercises which is still being withheld from HEP.  Further pelvic floor assessment through external technique showed concordant sign with palpation at obturator internus/ and deep/superficial transverse perineal mm. Pt demo'd decreased tensions in these areas.Modified pressure and technique when palpation caused tenderness and more fast paced breathing.  Pt reported shakiness in R LE post Tx and was guided through slowed breathing exercises in seated position and explained sometimes a stress response when tight mm are released. Pt demo'd no more shakiness inRLE and displayed normal breathing post training. Pt was advised to continue applying heat to pelvic and SIJ areas and epsom salt bath can also be helpful. Advised pt on more  water intake for managing lactic acid release following muscle tension releases. Plan to provide more assessment for pelvic floor health and mobility in upcoming sessions  given pt's Hx of ovarian CA and subsequent hysterectomy in 1990. Withholding stationery bike until pt shows no more concordant pain with palpation at pelvic floor and SIJ areas and bilateral strength at hip abductors. Pt continues to benefit from skilled PT.   Personal Factors and Comorbidities Comorbidity 3+    Comorbidities Hx of ovarian CA with Tx of hysterectomy, chemotherapy,1990,  Skin squamous/basal cell carcinoma removal at top of head, face, legs,arms, eye lid, , CAD, SOB with exercises, 1 vaginal deliveries, Side effects from chemotherapy: loss of hearing ( wears hearing aids B), neuropathy of feet and hands. Hx of ankle sprains in B, Hx of falls x 3 in the past 6 months ( 6-7 falls in one year). Physical activities: Prior to getting COVID 02/2021, pt used to play tennis 3 x week, yoga, spin classes, 3 miles. Long COVID Sx include: dry coough and tiredness. Pt noticed her stamina has not returned with playing tennis by the last set in 90 min periods compared to being able to play for 2 -2.5 hours    Examination-Activity Limitations Locomotion Level;Other    Stability/Clinical Decision Making Evolving/Moderate complexity    Rehab Potential Good    PT Frequency 1x / week    PT Duration Other (comment)   10   PT Treatment/Interventions Gait training;Stair training;Neuromuscular re-education;Moist Heat;Traction;Therapeutic activities;Therapeutic exercise;Patient/family education;Manual techniques;Energy conservation;Splinting;Taping;Cryotherapy;Balance training;ADLs/Self Care Home Management;Scar mobilization;Spinal Manipulations;Joint Manipulations;Functional mobility training    Consulted and Agree with Plan of Care Patient             Patient will benefit from skilled therapeutic intervention in order to improve the  following deficits and impairments:  Decreased endurance, Decreased activity tolerance, Decreased balance, Difficulty walking, Impaired flexibility, Impaired vision/preception, Decreased strength, Decreased mobility, Decreased coordination, Decreased scar mobility, Abnormal gait, Improper body mechanics, Pain, Increased muscle spasms, Postural dysfunction, Impaired perceived functional ability, Hypomobility, Decreased range of motion, Increased fascial restricitons  Visit Diagnosis: Other abnormalities of gait and mobility  Sacrococcygeal disorders, not elsewhere classified  Abnormal posture  Other muscle spasm     Problem List Patient Active Problem List   Diagnosis Date Noted   Hyperlipidemia 08/06/2018   Coronary artery disease with exertional angina ([place]) 07/11/2017   CAD (coronary artery disease) 07/11/2017   Dyspnea 05/01/2016  Solitary pulmonary nodule 05/02/2015   Adverse effects of medication 02/14/2015   Cough 01/28/2015   Bronchiectasis without acute exacerbation ([place]) 01/28/2015   Shadow 01/25/2015   Ovarian ca ([place])    Osteopenia     Jerl Mina, PT 06/28/2021, 8:38 AM  [place] MAIN [facility] SERVICES [address] [place], [place], [address] Phone: [phone]   Fax:  [phone]  Name: ANNICA MARINELLO MRN: [id number] Date of Birth: 26-Jun-1946

## 2021-06-29 ENCOUNTER — Encounter: Payer: Self-pay | Admitting: Dermatology

## 2021-07-04 ENCOUNTER — Other Ambulatory Visit: Payer: Self-pay

## 2021-07-04 ENCOUNTER — Ambulatory Visit: Payer: Medicare Other | Admitting: Physical Therapy

## 2021-07-04 DIAGNOSIS — R2689 Other abnormalities of gait and mobility: Secondary | ICD-10-CM

## 2021-07-04 DIAGNOSIS — R293 Abnormal posture: Secondary | ICD-10-CM | POA: Diagnosis not present

## 2021-07-04 DIAGNOSIS — M62838 Other muscle spasm: Secondary | ICD-10-CM

## 2021-07-04 DIAGNOSIS — M533 Sacrococcygeal disorders, not elsewhere classified: Secondary | ICD-10-CM | POA: Diagnosis not present

## 2021-07-04 NOTE — Therapy (Signed)
[place] MAIN [facility] SERVICES [address] [place], [place], [address] Phone: [phone]   Fax:  [phone]  Physical Therapy Treatment  Patient Details  Name: Barbara Schwartz MRN: [id number] Date of Birth: [birth year] Referring Provider (PT): Fara Olden MD   Encounter Date: 07/04/2021   PT End of Session - 07/04/21 1925     Visit Number 13    Date for PT Re-Evaluation 08/08/21   eval 03/23/21, PN 05/30/21   PT Start Time 0900    PT Stop Time 1010    PT Time Calculation (min) 70 min    Activity Tolerance Patient tolerated treatment well;No increased pain    Behavior During Therapy [facility] for tasks assessed/performed             Past Medical History:  Diagnosis Date   Atypical nevus 01/05/1997   slight-mod- 3rd & 4th toe web Left foot -WS   BCC (basal cell carcinoma) 03/17/2004   Rigth lower inner leg (CX35Fu)   BCC (basal cell carcinoma) 12/03/2012   left upper back (CX35FU)   BCC (basal cell carcinoma) x 2 04/09/2006   right forearm (MOHS) left scalp (MOHS)   Bowen's disease x 3 08/26/2015   Left calf medial- (CX35FU) , Left calf sup. (CX35FU), Left calf inf. (CX35FU)   bowens 05/05/1996   lower right shin/outer side (CX35FU)   CHF (congestive heart failure) (HCC)    Coronary artery disease    Dyspnea    with exercise   Nodular basal cell carcinoma (BCC) 07/04/2018   left side lower leg (MOHS)   Osteopenia 02/2019   T score -1.9 FRAX 18% / 9.6%   Ovarian cancer ([place]) 1990   Stage 3   SCC (squamous cell carcinoma) 08/17/2003   left ear (CX35FU)   SCC (squamous cell carcinoma) 03/17/2004   right ant. shin (CX35FU)   SCC (squamous cell carcinoma) 09/10/2017   left lower shin   SCC (squamous cell carcinoma) x 2 12/03/2012   left outer calf (CX35FU) right cheek (CX35FU)   SCC (squamous cell carcinoma) x 2 06/29/2014   right cheek, lateral (MOHS) Right cheek,medial (MOHS)   SCC (squamous cell carcinoma) x 3 07/04/2018    back of crown, inf, Left lower leg (CX35FU), Right lower calf (CX35FU)   Squamous cell carcinoma of skin 11/02/2019   in situ on right lower leg, posterior - CX3+5FU   Squamous cell carcinoma of skin 11/02/2019   in situ on left lower leg, posterior - CX3+5FU   Superficial basal cell carcinoma (BCC) 05/05/1996   left right shin inner side (CX35FU)   Superficial basal cell carcinoma (BCC) 08/24/2014   right submandibular (Dr Aundra Millet bx'd )    Past Surgical History:  Procedure Laterality Date   ABDOMINAL HYSTERECTOMY  1990   TAH BSO   Bowel obstruction     BREAST BIOPSY Right 11/27/2002   CARDIAC CATHETERIZATION  07/11/2017   INTRAVASCULAR PRESSURE WIRE/FFR STUDY N/A 07/11/2017   Procedure: INTRAVASCULAR PRESSURE WIRE/FFR STUDY;  Surgeon: Sherren Mocha, MD;  Location: [place] [facility];  Service: Cardiovascular;  Laterality: N/A;   OOPHORECTOMY     BSO   RIGHT/LEFT HEART CATH AND CORONARY ANGIOGRAPHY N/A 07/11/2017   Procedure: RIGHT/LEFT HEART CATH AND CORONARY ANGIOGRAPHY;  Surgeon: Sherren Mocha, MD;  Location: [place] [facility];  Service: Cardiovascular;  Laterality: N/A;   Squamous cell and Basal cell excision     VIDEO BRONCHOSCOPY Bilateral 02/15/2015   Procedure: VIDEO BRONCHOSCOPY WITH FLUORO;  Surgeon: Juanito Doom, MD;  Location: [place];  Service: Cardiopulmonary;  Laterality: Bilateral;    There were no vitals filed for this visit.   Subjective Assessment - 07/04/21 0904     Subjective Pt reports 1/10 at the R posterior glut area. It took a few days to calm down the area after last session. The shakiness in her RLE after the session did not come back after the relaxation practice    Pertinent History Hx of ovarian CA with Tx of hysterectomy, chemotherapy,1990,  Skin squamous/basal cell carcinoma removal at top of head, face, legs,arms, eye lid,   CAD, SOB with exercises, 1 vaginal deliveries, Side effects from chemotherapy: loss of hearing ( wears hearing  aids B), neuropathy of feet and hands. Hx of ankle sprains in B, Hx of falls x 3 in the past 6 months ( 6-7 falls in one year).  Physical activities: Prior to getting COVID 02/2021, pt used to play tennis 3 x week, yoga, spin classes, 3 miles. Long COVID Sx include: dry cough and tiredness. Pt noticed her stamina has not returned with playing tennis by the last set in 90 min periods compared to being able to play for 2 -2.5 hours.    Patient Stated Goals work this pain and be back on the bike                [facility] PT Assessment - 07/04/21 0909       Strength   Overall Strength Comments L  hip abd and hip ext 4--/5, R 3/5      Palpation   SI assessment  hypomobile S2-3 level at R SIJ, base of sacrum limited anterior   rotation,                           [facility] Adult PT Treatment/Exercise - 07/04/21 0958       Neuro Re-ed    Neuro Re-ed Details  cued for more toe extension /  knees apart, scapular retraction   in quadriped in modified birddog ([facility])  and cued for swimmers to strengthen gluts andn posterior sling      Manual Therapy   Manual therapy comments PA mob Grade III, rotational mob STM/MWM at SIJ to promote alignment on R                          PT Long Term Goals - 07/04/21 1927       PT LONG TERM GOAL #1   Title Pt will report decreased pain from 4/10 to  < 2/10 after 15 min of sitting on clinic soft chair and being able to sit on hard chairs with 50% less pain inorder to sit incomunity settings    Time 6    Period Weeks    Status Achieved      PT LONG TERM GOAL #2   Title Pt will have no flareups of pain in R buttock after walking on pavement for 30 min    Time 8    Period Weeks    Status Achieved      PT LONG TERM GOAL #3   Title Pt will be able ride a bike in spin class for 25 min and be compliant stretches and HEP in order to return to biking stationery bike    Time 10    Period Weeks    Status On-going  PT LONG TERM  GOAL #4   Title Pt will demo no trunk lean and more levelled pelvis and shoulders, and space between rib and iliac crest will be equal in order to minimize pain with sitting and walking and biking  and progress to deep core exercises to maintain alignment    Baseline 3 fingers on R flank, 4 fingers L flank between lowest rib and iliac crest    Time 2    Period Weeks    Status Achieved      PT LONG TERM GOAL #5   Title Pt will improve her FOTO score from 57 pts to  > 67 pts inorder to demonstrate improved functional mobility  ( 06/13/21: 79 pts)    Baseline 57 pts    Time 10    Period Weeks    Status Achieved      PT LONG TERM GOAL #6   Title Pt will demonstrate decreased scar restrictions along abdominal scar in order to progress to more strengthening exercises for trunk stability for decreased risk of falls    Time 5    Period Weeks    Status Partially Met      PT LONG TERM GOAL #7   Title Pt will demo increased R hip abduction strength from 2/5 to > 4/5 and no tenderness at ischial tuberosity and glut med in order to improve pelvic stability for balance and mounting a bike.    Time 8    Period Weeks    Status On-going      PT LONG TERM GOAL #8   Title Pt will increase cervical mobility to look over shoulder when biking in order to minimize falling off bike and maintaining balance while walking    Baseline cervical rotation R 30 deg, L 40 deg,  flexion R 25 deg, L 30 deg,    Status On-going      PT LONG TERM GOAL  #9   TITLE Pt will increase cervical mm endurance test time from  2 min, 48 sec  -> > 3 min in order to minimize neck pain and demo no more forward head posture / throacic kyphosis    Time 4    Period Weeks    Status On-going                   Plan - 07/04/21 1925     Clinical Impression Statement Pt demo'd less tenderness at R SIJ but still required more manual Tx to improve mobility. Pt progressed to modified birddog and another posterior strengthening  of glut and back mm with excessive cues for alignment and propioception. Pt continues to benefit from skilled PT. Plan to advance to isolated R glut strengthening if it still remains weak in abduction/ extension before progressing to stationery bike. Pt continues to benefit from skilled PT   Personal Factors and Comorbidities Comorbidity 3+    Comorbidities Hx of ovarian CA with Tx of hysterectomy, chemotherapy,1990,  Skin squamous/basal cell carcinoma removal at top of head, face, legs,arms, eye lid, , CAD, SOB with exercises, 1 vaginal deliveries, Side effects from chemotherapy: loss of hearing ( wears hearing aids B), neuropathy of feet and hands. Hx of ankle sprains in B, Hx of falls x 3 in the past 6 months ( 6-7 falls in one year). Physical activities: Prior to getting COVID 02/2021, pt used to play tennis 3 x week, yoga, spin classes, 3 miles. Long COVID Sx include: dry coough and tiredness. Pt noticed  her stamina has not returned with playing tennis by the last set in 90 min periods compared to being able to play for 2 -2.5 hours    Examination-Activity Limitations Locomotion Level;Other    Stability/Clinical Decision Making Evolving/Moderate complexity    Rehab Potential Good    PT Frequency 1x / week    PT Duration Other (comment)   10   PT Treatment/Interventions Gait training;Stair training;Neuromuscular re-education;Moist Heat;Traction;Therapeutic activities;Therapeutic exercise;Patient/family education;Manual techniques;Energy conservation;Splinting;Taping;Cryotherapy;Balance training;ADLs/Self Care Home Management;Scar mobilization;Spinal Manipulations;Joint Manipulations;Functional mobility training    Consulted and Agree with Plan of Care Patient             Patient will benefit from skilled therapeutic intervention in order to improve the following deficits and impairments:  Decreased endurance, Decreased activity tolerance, Decreased balance, Difficulty walking, Impaired  flexibility, Impaired vision/preception, Decreased strength, Decreased mobility, Decreased coordination, Decreased scar mobility, Abnormal gait, Improper body mechanics, Pain, Increased muscle spasms, Postural dysfunction, Impaired perceived functional ability, Hypomobility, Decreased range of motion, Increased fascial restricitons  Visit Diagnosis: Sacrococcygeal disorders, not elsewhere classified  Other abnormalities of gait and mobility  Abnormal posture  Other muscle spasm     Problem List Patient Active Problem List   Diagnosis Date Noted   Hyperlipidemia 08/06/2018   Coronary artery disease with exertional angina ([place]) 07/11/2017   CAD (coronary artery disease) 07/11/2017   Dyspnea 05/01/2016   Solitary pulmonary nodule 05/02/2015   Adverse effects of medication 02/14/2015   Cough 01/28/2015   Bronchiectasis without acute exacerbation ([place]) 01/28/2015   Shadow 01/25/2015   Ovarian ca ([place])    Osteopenia     Jerl Mina, PT 07/04/2021, 7:28 PM  [place] MAIN [facility] SERVICES [address] [place], [place], [address] Phone: [phone]   Fax:  [phone]  Name: MIRIELLE BYRUM MRN: [id number] Date of Birth: 05-04-1946

## 2021-07-04 NOTE — Patient Instructions (Signed)
Modified Birddog  Table top position,  6 points of contact: paw hands, knees hip width apart, toes tucked under  Shoulders down and back like squeezing armpits  Not sagging back   L arm up , thumbs up, arm is out like a half"V" shoulder blade slides down and back  R knee straight, toes tucked on the ground,  Lengthen whole spine as if yard stick is balanced on spine, chin tucked   L + R = 1 rep 10 reps   X 2   ___  Modified swimmer   Pillow under belly   Same as  above  Lift thigh up 15-20 deg  not more  If you lift too high, you feel it in the low back which is not what you want  10 reps = L/R    ___ childs poses rocking with pillow under belly if need more support/ relaxation  childs poses rocking   Toes tucked, shoulders down and back, on forearms , hands shoulder width apart  10 reps  __

## 2021-07-07 ENCOUNTER — Encounter: Payer: Self-pay | Admitting: Dermatology

## 2021-07-07 NOTE — Progress Notes (Signed)
Follow-Up Visit   Subjective  Barbara Schwartz is a 75 y.o. female who presents for the following: Procedure (Right submandibular scc in situ features of wart, also recheck the scalp previous biopsy tx with biopsy. Left lower leg efudex 20 total treatments still has some raised area in the location ).  Persistent crust left leg and new crust scalp plus several other areas to check Location:  Duration:  Quality:  Associated Signs/Symptoms: Modifying Factors:  Severity:  Timing: Context:   Objective  Well appearing patient in no apparent distress; mood and affect are within normal limits. Right Anterior Neck Subtle residual scale (plus a small verrucous papule right submandibular).  Whats left over treat with aldara no surgery today right submandibular   Gluteal Crease Arciform 1.5 cm dermatitis mid buttocks with central clearing, but no real scale on border.  KOH negative immediately and recheck 2 hours later.  Left Upper Arm - Anterior, Scalp 4 to 5 mm hornlike pink crusts  Left Lower Leg - Anterior Previously treated with efudex; contiguous 5 mm meter waxy papule with focal erosion 1.8 cm subtly raised pink scale     Left Frontal Scalp 8 mm focally eroded crust.         A focused examination was performed including scalp, face, neck, upper torso, arms, legs. Relevant physical exam findings are noted in the Assessment and Plan.   Assessment & Plan    History of squamous cell carcinoma Right Anterior Neck  Aldara applied Monday Wednesday Friday for 6 weeks to the subtle scale.  May return to remove submandibular lesion.  Tinea corporis Gluteal Crease  Diagnosis uncertain but I do not favor tinea (no sign of fungus on the ipsilateral foot).  We will try triamcinolone daily for 3 weeks while awaiting fungal culture.  triamcinolone cream (KENALOG) 0.1 % - Gluteal Crease Apply 1 application topically daily as needed.  Related Procedures Culture, fungus  without smear POCT Skin KOH  AK (actinic keratosis) (2) Left Upper Arm - Anterior; Scalp  Destruction of lesion - Left Upper Arm - Anterior, Scalp Complexity: simple   Destruction method: cryotherapy   Informed consent: discussed and consent obtained   Timeout:  patient name, date of birth, surgical site, and procedure verified Lesion destroyed using liquid nitrogen: Yes   Cryotherapy cycles:  3 Outcome: patient tolerated procedure well with no complications   Post-procedure details: wound care instructions given    Basal cell carcinoma of skin of left lower limb, including hip Left Lower Leg - Anterior  Skin / nail biopsy Type of biopsy: tangential   Informed consent: discussed and consent obtained   Timeout: patient name, date of birth, surgical site, and procedure verified   Anesthesia: the lesion was anesthetized in a standard fashion   Anesthetic:  1% lidocaine w/ epinephrine 1-100,000 local infiltration Instrument used: flexible razor blade   Hemostasis achieved with: aluminum chloride and electrodesiccation   Outcome: patient tolerated procedure well   Post-procedure details: wound care instructions given    Destruction of lesion Complexity: simple   Destruction method: electrodesiccation and curettage   Informed consent: discussed and consent obtained   Timeout:  patient name, date of birth, surgical site, and procedure verified Anesthesia: the lesion was anesthetized in a standard fashion   Anesthetic:  1% lidocaine w/ epinephrine 1-100,000 local infiltration Curettage performed in three different directions: Yes   Electrodesiccation performed over the curetted area: Yes   Curettage cycles:  3 Lesion length (cm):  2.1 Lesion  width (cm):  2.1 Margin per side (cm):  0 Final wound size (cm):  2.1 Hemostasis achieved with:  aluminum chloride Outcome: patient tolerated procedure well with no complications   Post-procedure details: wound care instructions given     Specimen 1 - Surgical pathology Differential Diagnosis: bcc scc tx with bx curet and cautery   Check Margins: No  Carcinoma in situ of skin of scalp and neck Left Frontal Scalp  Skin / nail biopsy Type of biopsy: tangential   Informed consent: discussed and consent obtained   Timeout: patient name, date of birth, surgical site, and procedure verified   Procedure prep:  Patient was prepped and draped in usual sterile fashion (Non sterile) Prep type:  Chlorhexidine Anesthesia: the lesion was anesthetized in a standard fashion   Anesthetic:  1% lidocaine w/ epinephrine 1-100,000 local infiltration Instrument used: flexible razor blade   Outcome: patient tolerated procedure well   Post-procedure details: wound care instructions given    Destruction of lesion Complexity: simple   Destruction method: electrodesiccation and curettage   Informed consent: discussed and consent obtained   Timeout:  patient name, date of birth, surgical site, and procedure verified Anesthesia: the lesion was anesthetized in a standard fashion   Anesthetic:  1% lidocaine w/ epinephrine 1-100,000 local infiltration Curettage performed in three different directions: Yes   Electrodesiccation performed over the curetted area: Yes   Curettage cycles:  3 Lesion length (cm):  1.5 Lesion width (cm):  1.5 Margin per side (cm):  0 Final wound size (cm):  1.5 Hemostasis achieved with:  aluminum chloride Outcome: patient tolerated procedure well with no complications   Post-procedure details: wound care instructions given    Specimen 2 - Surgical pathology Differential Diagnosis: bcc scc tx with bx   Check Margins: No  After shave biopsy of the lesions were treated with curettage plus cautery      I, [name], MD, have reviewed all documentation for this visit.  The documentation on 07/07/21 for the exam, diagnosis, procedures, and orders are all accurate and complete.

## 2021-07-11 ENCOUNTER — Other Ambulatory Visit: Payer: Self-pay

## 2021-07-11 ENCOUNTER — Ambulatory Visit: Payer: Medicare Other | Admitting: Physical Therapy

## 2021-07-11 DIAGNOSIS — R293 Abnormal posture: Secondary | ICD-10-CM | POA: Diagnosis not present

## 2021-07-11 DIAGNOSIS — M533 Sacrococcygeal disorders, not elsewhere classified: Secondary | ICD-10-CM

## 2021-07-11 DIAGNOSIS — R2689 Other abnormalities of gait and mobility: Secondary | ICD-10-CM

## 2021-07-11 DIAGNOSIS — M62838 Other muscle spasm: Secondary | ICD-10-CM

## 2021-07-11 NOTE — Patient Instructions (Addendum)
°  Clam Shell 45 Degrees  Lying with hips and knees bent 45, one pillow between knees and ankles. Heel together, toes apart like ballerina,  Lift knee with exhale while pressing heels together. Be sure pelvis does not roll backward. Do not arch back. Do 10 times, each leg, 2 times per day.     Complimentary stretch: Figure-4  with body turned 45 deg  , bent knee propped by pillows, lean forward with back straight 5 breaths

## 2021-07-11 NOTE — Therapy (Signed)
[place] [facility] SERVICES [address] [place], [place], [address] Phone: [phone]   Fax:  [phone]  Physical Therapy Treatment  Patient Details  Name: Barbara Schwartz MRN: [id number] Date of Birth: Sep 14, 1945 Referring Provider (PT): Fara Olden MD   Encounter Date: 07/11/2021   PT End of Session - 07/11/21 0915     Visit Number 14    Date for PT Re-Evaluation 08/08/21   eval 03/23/21, PN 05/30/21   PT Start Time 0900    PT Stop Time 1000    PT Time Calculation (min) 60 min    Activity Tolerance Patient tolerated treatment well;No increased pain    Behavior During Therapy [facility] for tasks assessed/performed             Past Medical History:  Diagnosis Date   Atypical nevus 01/05/1997   slight-mod- 3rd & 4th toe web Left foot -WS   BCC (basal cell carcinoma) 03/17/2004   Rigth lower inner leg (CX35Fu)   BCC (basal cell carcinoma) 12/03/2012   left upper back (CX35FU)   BCC (basal cell carcinoma) x 2 04/09/2006   right forearm (MOHS) left scalp (MOHS)   Bowen's disease x 3 08/26/2015   Left calf medial- (CX35FU) , Left calf sup. (CX35FU), Left calf inf. (CX35FU)   bowens 05/05/1996   lower right shin/outer side (CX35FU)   CHF (congestive heart failure) (HCC)    Coronary artery disease    Dyspnea    with exercise   Nodular basal cell carcinoma (BCC) 07/04/2018   left side lower leg (MOHS)   Osteopenia 02/2019   T score -1.9 FRAX 18% / 9.6%   Ovarian cancer ([place]) 1990   Stage 3   SCC (squamous cell carcinoma) 08/17/2003   left ear (CX35FU)   SCC (squamous cell carcinoma) 03/17/2004   right ant. shin (CX35FU)   SCC (squamous cell carcinoma) 09/10/2017   left lower shin   SCC (squamous cell carcinoma) x 2 12/03/2012   left outer calf (CX35FU) right cheek (CX35FU)   SCC (squamous cell carcinoma) x 2 06/29/2014   right cheek, lateral (MOHS) Right cheek,medial (MOHS)   SCC (squamous cell carcinoma) x 3 07/04/2018    back of crown, inf, Left lower leg (CX35FU), Right lower calf (CX35FU)   Squamous cell carcinoma of skin 11/02/2019   in situ on right lower leg, posterior - CX3+5FU   Squamous cell carcinoma of skin 11/02/2019   in situ on left lower leg, posterior - CX3+5FU   Superficial basal cell carcinoma (BCC) 05/05/1996   left right shin inner side (CX35FU)   Superficial basal cell carcinoma (BCC) 08/24/2014   right submandibular (Dr Aundra Millet bx'd )    Past Surgical History:  Procedure Laterality Date   ABDOMINAL HYSTERECTOMY  1990   TAH BSO   Bowel obstruction     BREAST BIOPSY Right 11/27/2002   CARDIAC CATHETERIZATION  07/11/2017   INTRAVASCULAR PRESSURE WIRE/FFR STUDY N/A 07/11/2017   Procedure: INTRAVASCULAR PRESSURE WIRE/FFR STUDY;  Surgeon: Sherren Mocha, MD;  Location: [place] [facility];  Service: Cardiovascular;  Laterality: N/A;   OOPHORECTOMY     BSO   RIGHT/LEFT HEART CATH AND CORONARY ANGIOGRAPHY N/A 07/11/2017   Procedure: RIGHT/LEFT HEART CATH AND CORONARY ANGIOGRAPHY;  Surgeon: Sherren Mocha, MD;  Location: [place] [facility];  Service: Cardiovascular;  Laterality: N/A;   Squamous cell and Basal cell excision     VIDEO BRONCHOSCOPY Bilateral 02/15/2015   Procedure: VIDEO BRONCHOSCOPY WITH FLUORO;  Surgeon: Juanito Doom, MD;  Location: [place];  Service: Cardiopulmonary;  Laterality: Bilateral;    There were no vitals filed for this visit.   Subjective Assessment - 07/11/21 0912     Subjective Pt reports 2/10 pain at the neck. Pt notices her R glut pain is always there 1/10. If she climbs stairs, she notices it as 2-3/10.    Pertinent History Hx of ovarian CA with Tx of hysterectomy, chemotherapy,1990,  Skin squamous/basal cell carcinoma removal at top of head, face, legs,arms, eye lid,   CAD, SOB with exercises, 1 vaginal deliveries, Side effects from chemotherapy: loss of hearing ( wears hearing aids B), neuropathy of feet and hands. Hx of ankle sprains in B, Hx  of falls x 3 in the past 6 months ( 6-7 falls in one year).  Physical activities: Prior to getting COVID 02/2021, pt used to play tennis 3 x week, yoga, spin classes, 3 miles. Long COVID Sx include: dry cough and tiredness. Pt noticed her stamina has not returned with playing tennis by the last set in 90 min periods compared to being able to play for 2 -2.5 hours.    Patient Stated Goals work this pain and be back on the bike                [facility] PT Assessment - 07/11/21 0913       Coordination   Coordination and Movement Description clam shells B 10 reps before fatigue, no pain      AROM   Overall AROM Comments limited figure 4 in chair      Strength   Overall Strength Comments B hip abd 3+/5 ( R hip abd 3/5),  hip ext      Palpation   SI assessment  no tenderness at R SIJ, no deviations at coccyx,    Palpation comment tightness at adductors R, hypomobile R SIJ, coccyx flexed on R side                           [facility] Adult PT Treatment/Exercise - 07/11/21 1000       Neuro Re-ed    Neuro Re-ed Details  cued for clam shells 10 reps      Moist Heat Therapy   Number Minutes Moist Heat 10 Minutes    Moist Heat Location --   adductor, neck, during hip abduction exercises, neck Tx     Manual Therapy   Manual therapy comments pelvis: rotational mob, PA mobGrade III at R SIJ, suprior mob at coccyx, STM/MWM at adductors attached to rami and tuberosity , neck: distraction, Medial glide at T 1-2 , STM /MWM              Administered FOTO                                                            PT Long Term Goals - 07/04/21 1927       PT LONG TERM GOAL #1   Title Pt will report decreased pain from 4/10 to  < 2/10 after 15 min of sitting on clinic soft chair and being able to sit on hard chairs with 50% less pain inorder to sit incomunity settings    Time 6  Period Weeks    Status Achieved      PT LONG TERM GOAL #2   Title Pt will have no  flareups of pain in R buttock after walking on pavement for 30 min    Time 8    Period Weeks    Status Achieved      PT LONG TERM GOAL #3   Title Pt will be able ride a bike in spin class for 25 min and be compliant stretches and HEP in order to return to biking stationery bike    Time 10    Period Weeks    Status On-going      PT LONG TERM GOAL #4   Title Pt will demo no trunk lean and more levelled pelvis and shoulders, and space between rib and iliac crest will be equal in order to minimize pain with sitting and walking and biking  and progress to deep core exercises to maintain alignment    Baseline 3 fingers on R flank, 4 fingers L flank between lowest rib and iliac crest    Time 2    Period Weeks    Status Achieved      PT LONG TERM GOAL #5   Title Pt will improve her FOTO score from 57 pts to  > 67 pts inorder to demonstrate improved functional mobility  ( 06/13/21: 79 pts)    Baseline 57 pts    Time 10    Period Weeks    Status Achieved      PT LONG TERM GOAL #6   Title Pt will demonstrate decreased scar restrictions along abdominal scar in order to progress to more strengthening exercises for trunk stability for decreased risk of falls    Time 5    Period Weeks    Status Partially Met      PT LONG TERM GOAL #7   Title Pt will demo increased R hip abduction strength from 2/5 to > 4/5 and no tenderness at ischial tuberosity and glut med in order to improve pelvic stability for balance and mounting a bike.    Time 8    Period Weeks    Status On-going      PT LONG TERM GOAL #8   Title Pt will increase cervical mobility to look over shoulder when biking in order to minimize falling off bike and maintaining balance while walking    Baseline cervical rotation R 30 deg, L 40 deg,  flexion R 25 deg, L 30 deg,    Status On-going      PT LONG TERM GOAL  #9   TITLE Pt will increase cervical mm endurance test time from  2 min, 48 sec  -> > 3 min in order to minimize neck pain  and demo no more forward head posture / throacic kyphosis    Time 4    Period Weeks    Status On-going                   Plan - 07/11/21 1029     Clinical Impression Statement Pt made a miles tone improvement today after continued manual Tx at R SIJ and coccyx on R as pt demo'd ability to perform 10 reps of clamshell exercises without pain before fatigue. Pt required manual Tx today to continue extending coccxy and promoting R SIJ/ R adductor / mobility and reported no tenderness at previously tender spots.  Anticipate pt will be ready to try stationary biking as long as pt  maintains hip abduction strengthening and flexibility routine. Pt continues to benefit from skilled PT.    Personal Factors and Comorbidities Comorbidity 3+    Comorbidities Hx of ovarian CA with Tx of hysterectomy, chemotherapy,1990,  Skin squamous/basal cell carcinoma removal at top of head, face, legs,arms, eye lid, , CAD, SOB with exercises, 1 vaginal deliveries, Side effects from chemotherapy: loss of hearing ( wears hearing aids B), neuropathy of feet and hands. Hx of ankle sprains in B, Hx of falls x 3 in the past 6 months ( 6-7 falls in one year). Physical activities: Prior to getting COVID 02/2021, pt used to play tennis 3 x week, yoga, spin classes, 3 miles. Long COVID Sx include: dry coough and tiredness. Pt noticed her stamina has not returned with playing tennis by the last set in 90 min periods compared to being able to play for 2 -2.5 hours    Examination-Activity Limitations Locomotion Level;Other    Stability/Clinical Decision Making Evolving/Moderate complexity    Rehab Potential Good    PT Frequency 1x / week    PT Duration Other (comment)   10   PT Treatment/Interventions Gait training;Stair training;Neuromuscular re-education;Moist Heat;Traction;Therapeutic activities;Therapeutic exercise;Patient/family education;Manual techniques;Energy conservation;Splinting;Taping;Cryotherapy;Balance  training;ADLs/Self Care Home Management;Scar mobilization;Spinal Manipulations;Joint Manipulations;Functional mobility training    Consulted and Agree with Plan of Care Patient             Patient will benefit from skilled therapeutic intervention in order to improve the following deficits and impairments:  Decreased endurance, Decreased activity tolerance, Decreased balance, Difficulty walking, Impaired flexibility, Impaired vision/preception, Decreased strength, Decreased mobility, Decreased coordination, Decreased scar mobility, Abnormal gait, Improper body mechanics, Pain, Increased muscle spasms, Postural dysfunction, Impaired perceived functional ability, Hypomobility, Decreased range of motion, Increased fascial restricitons  Visit Diagnosis: Sacrococcygeal disorders, not elsewhere classified  Other abnormalities of gait and mobility  Abnormal posture  Other muscle spasm     Problem List Patient Active Problem List   Diagnosis Date Noted   Hyperlipidemia 08/06/2018   Coronary artery disease with exertional angina ([place]) 07/11/2017   CAD (coronary artery disease) 07/11/2017   Dyspnea 05/01/2016   Solitary pulmonary nodule 05/02/2015   Adverse effects of medication 02/14/2015   Cough 01/28/2015   Bronchiectasis without acute exacerbation ([place]) 01/28/2015   Shadow 01/25/2015   Ovarian ca ([place])    Osteopenia     Jerl Mina, PT 07/11/2021, 10:33 AM  [place], [place], [address] Phone: [phone]   Fax:  [phone]  Name: Barbara Schwartz MRN: [id number] Date of Birth: 1945/08/21

## 2021-07-18 ENCOUNTER — Ambulatory Visit: Payer: Medicare Other | Admitting: Physical Therapy

## 2021-07-18 ENCOUNTER — Other Ambulatory Visit: Payer: Self-pay

## 2021-07-18 DIAGNOSIS — R2689 Other abnormalities of gait and mobility: Secondary | ICD-10-CM

## 2021-07-18 DIAGNOSIS — M533 Sacrococcygeal disorders, not elsewhere classified: Secondary | ICD-10-CM

## 2021-07-18 DIAGNOSIS — M62838 Other muscle spasm: Secondary | ICD-10-CM | POA: Diagnosis not present

## 2021-07-18 DIAGNOSIS — R293 Abnormal posture: Secondary | ICD-10-CM

## 2021-07-18 NOTE — Patient Instructions (Addendum)
°  °  Minisquat: Scoot buttocks back slight, hinge like you are looking at your reflection on a pond  Knees behind toes, Knee along 2-3rd toe line  Inhale to "smell flowers"  Exhale on the rise "like rocket"   Do not lock knees, have more weight across ballmounds of feet, toes relaxed   THEN HALF step on both feet first lap with left foot leading down a hall way = 1 lap  Repeated with other foot leading =1 lap  2 laps each side

## 2021-07-18 NOTE — Therapy (Signed)
[place] MAIN [facility] SERVICES [address] [place], [place], [address] Phone: [phone]   Fax:  [phone]  Physical Therapy Treatment  Patient Details  Name: Barbara Schwartz MRN: [id number] Date of Birth: 1946/06/05 Referring Provider (PT): Fara Olden MD   Encounter Date: 07/18/2021   PT End of Session - 07/18/21 0852     Visit Number 15    Date for PT Re-Evaluation 08/08/21   eval 03/23/21, PN 05/30/21   PT Start Time 0900    PT Stop Time 1000    PT Time Calculation (min) 60 min    Activity Tolerance Patient tolerated treatment well;No increased pain    Behavior During Therapy [facility] for tasks assessed/performed             Past Medical History:  Diagnosis Date   Atypical nevus 01/05/1997   slight-mod- 3rd & 4th toe web Left foot -WS   BCC (basal cell carcinoma) 03/17/2004   Rigth lower inner leg (CX35Fu)   BCC (basal cell carcinoma) 12/03/2012   left upper back (CX35FU)   BCC (basal cell carcinoma) x 2 04/09/2006   right forearm (MOHS) left scalp (MOHS)   Bowen's disease x 3 08/26/2015   Left calf medial- (CX35FU) , Left calf sup. (CX35FU), Left calf inf. (CX35FU)   bowens 05/05/1996   lower right shin/outer side (CX35FU)   CHF (congestive heart failure) (HCC)    Coronary artery disease    Dyspnea    with exercise   Nodular basal cell carcinoma (BCC) 07/04/2018   left side lower leg (MOHS)   Osteopenia 02/2019   T score -1.9 FRAX 18% / 9.6%   Ovarian cancer ([place]) 1990   Stage 3   SCC (squamous cell carcinoma) 08/17/2003   left ear (CX35FU)   SCC (squamous cell carcinoma) 03/17/2004   right ant. shin (CX35FU)   SCC (squamous cell carcinoma) 09/10/2017   left lower shin   SCC (squamous cell carcinoma) x 2 12/03/2012   left outer calf (CX35FU) right cheek (CX35FU)   SCC (squamous cell carcinoma) x 2 06/29/2014   right cheek, lateral (MOHS) Right cheek,medial (MOHS)   SCC (squamous cell carcinoma) x 3 07/04/2018    back of crown, inf, Left lower leg (CX35FU), Right lower calf (CX35FU)   Squamous cell carcinoma of skin 11/02/2019   in situ on right lower leg, posterior - CX3+5FU   Squamous cell carcinoma of skin 11/02/2019   in situ on left lower leg, posterior - CX3+5FU   Superficial basal cell carcinoma (BCC) 05/05/1996   left right shin inner side (CX35FU)   Superficial basal cell carcinoma (BCC) 08/24/2014   right submandibular (Dr Aundra Millet bx'd )    Past Surgical History:  Procedure Laterality Date   ABDOMINAL HYSTERECTOMY  1990   TAH BSO   Bowel obstruction     BREAST BIOPSY Right 11/27/2002   CARDIAC CATHETERIZATION  07/11/2017   INTRAVASCULAR PRESSURE WIRE/FFR STUDY N/A 07/11/2017   Procedure: INTRAVASCULAR PRESSURE WIRE/FFR STUDY;  Surgeon: Sherren Mocha, MD;  Location: [place] [facility];  Service: Cardiovascular;  Laterality: N/A;   OOPHORECTOMY     BSO   RIGHT/LEFT HEART CATH AND CORONARY ANGIOGRAPHY N/A 07/11/2017   Procedure: RIGHT/LEFT HEART CATH AND CORONARY ANGIOGRAPHY;  Surgeon: Sherren Mocha, MD;  Location: [place] [facility];  Service: Cardiovascular;  Laterality: N/A;   Squamous cell and Basal cell excision     VIDEO BRONCHOSCOPY Bilateral 02/15/2015   Procedure: VIDEO BRONCHOSCOPY WITH FLUORO;  Surgeon: Juanito Doom, MD;  Location: [place];  Service: Cardiopulmonary;  Laterality: Bilateral;    There were no vitals filed for this visit.   Subjective Assessment - 07/18/21 0906     Subjective Pt is feeling great.    Pertinent History Hx of ovarian CA with Tx of hysterectomy, chemotherapy,1990,  Skin squamous/basal cell carcinoma removal at top of head, face, legs,arms, eye lid,   CAD, SOB with exercises, 1 vaginal deliveries, Side effects from chemotherapy: loss of hearing ( wears hearing aids B), neuropathy of feet and hands. Hx of ankle sprains in B, Hx of falls x 3 in the past 6 months ( 6-7 falls in one year).  Physical activities: Prior to getting COVID  02/2021, pt used to play tennis 3 x week, yoga, spin classes, 3 miles. Long COVID Sx include: dry cough and tiredness. Pt noticed her stamina has not returned with playing tennis by the last set in 90 min periods compared to being able to play for 2 -2.5 hours.    Patient Stated Goals work this pain and be back on the bike                [facility] PT Assessment - 07/18/21 0950       Strength   Overall Strength Comments B hip abd, ext 3++/5     Palpation   Palpation comment tightness at  B deeper pelvic floor mm posterior, ob int, B adductors distal to proximal attachments at knees ,B   tib ant             Observation: minisquat with posterior COM, genu valgus                [facility] Adult PT Treatment/Exercise - 07/18/21 8841       Neuro Re-ed    Neuro Re-ed Details  cued for knee alignment and more anterior COM with transverse arch co-ativation in squats to minimize genu valgus. overuse of adductors      Modalities   Modalities Moist Heat      Moist Heat Therapy   Number Minutes Moist Heat 5 Minutes    Moist Heat Location --   adductors, low abdomen, unbilled     Manual Therapy   Manual therapy comments STM/MWM at problem areas noted in assessment                          PT Long Term Goals - 07/04/21 1927       PT LONG TERM GOAL #1   Title Pt will report decreased pain from 4/10 to  < 2/10 after 15 min of sitting on clinic soft chair and being able to sit on hard chairs with 50% less pain inorder to sit incomunity settings    Time 6    Period Weeks    Status Achieved      PT LONG TERM GOAL #2   Title Pt will have no flareups of pain in R buttock after walking on pavement for 30 min    Time 8    Period Weeks    Status Achieved      PT LONG TERM GOAL #3   Title Pt will be able ride a bike in spin class for 25 min and be compliant stretches and HEP in order to return to biking stationery bike    Time 10    Period Weeks    Status  On-going  PT LONG TERM GOAL #4   Title Pt will demo no trunk lean and more levelled pelvis and shoulders, and space between rib and iliac crest will be equal in order to minimize pain with sitting and walking and biking  and progress to deep core exercises to maintain alignment    Baseline 3 fingers on R flank, 4 fingers L flank between lowest rib and iliac crest    Time 2    Period Weeks    Status Achieved      PT LONG TERM GOAL #5   Title Pt will improve her FOTO score from 57 pts to  > 67 pts inorder to demonstrate improved functional mobility  ( 06/13/21: 79 pts)    Baseline 57 pts    Time 10    Period Weeks    Status Achieved      PT LONG TERM GOAL #6   Title Pt will demonstrate decreased scar restrictions along abdominal scar in order to progress to more strengthening exercises for trunk stability for decreased risk of falls    Time 5    Period Weeks    Status Partially Met      PT LONG TERM GOAL #7   Title Pt will demo increased R hip abduction strength from 2/5 to > 4/5 and no tenderness at ischial tuberosity and glut med in order to improve pelvic stability for balance and mounting a bike.    Time 8    Period Weeks    Status On-going      PT LONG TERM GOAL #8   Title Pt will increase cervical mobility to look over shoulder when biking in order to minimize falling off bike and maintaining balance while walking    Baseline cervical rotation R 30 deg, L 40 deg,  flexion R 25 deg, L 30 deg,    Status On-going      PT LONG TERM GOAL  #9   TITLE Pt will increase cervical mm endurance test time from  2 min, 48 sec  -> > 3 min in order to minimize neck pain and demo no more forward head posture / throacic kyphosis    Time 4    Period Weeks    Status On-going                   Plan - 07/18/21 0907     Clinical Impression Statement Pt made a milestone improvement with bilateral strength with hip ext and abduction. Pt still requried manual Tx at posterior deep  pelvic floor mm and adductors. Pt demo'd improved mobility at these areas post Tx. Required cues for anterior COM and knee alignment with CKC hip abduction and glut strengthening. Plan to introduce stationary bike next session. Pt continues to benefit from skilled PT                 Personal Factors and Comorbidities Comorbidity 3+    Comorbidities Hx of ovarian CA with Tx of hysterectomy, chemotherapy,1990,  Skin squamous/basal cell carcinoma removal at top of head, face, legs,arms, eye lid, , CAD, SOB with exercises, 1 vaginal deliveries, Side effects from chemotherapy: loss of hearing ( wears hearing aids B), neuropathy of feet and hands. Hx of ankle sprains in B, Hx of falls x 3 in the past 6 months ( 6-7 falls in one year). Physical activities: Prior to getting COVID 02/2021, pt used to play tennis 3 x week, yoga, spin classes, 3 miles. Long COVID Sx include: dry  coough and tiredness. Pt noticed her stamina has not returned with playing tennis by the last set in 90 min periods compared to being able to play for 2 -2.5 hours    Examination-Activity Limitations Locomotion Level;Other    Stability/Clinical Decision Making Evolving/Moderate complexity    Rehab Potential Good    PT Frequency 1x / week    PT Duration Other (comment)   10   PT Treatment/Interventions Gait training;Stair training;Neuromuscular re-education;Moist Heat;Traction;Therapeutic activities;Therapeutic exercise;Patient/family education;Manual techniques;Energy conservation;Splinting;Taping;Cryotherapy;Balance training;ADLs/Self Care Home Management;Scar mobilization;Spinal Manipulations;Joint Manipulations;Functional mobility training    Consulted and Agree with Plan of Care Patient             Patient will benefit from skilled therapeutic intervention in order to improve the following deficits and impairments:  Decreased endurance, Decreased activity tolerance, Decreased balance, Difficulty walking, Impaired flexibility,  Impaired vision/preception, Decreased strength, Decreased mobility, Decreased coordination, Decreased scar mobility, Abnormal gait, Improper body mechanics, Pain, Increased muscle spasms, Postural dysfunction, Impaired perceived functional ability, Hypomobility, Decreased range of motion, Increased fascial restricitons  Visit Diagnosis: Sacrococcygeal disorders, not elsewhere classified  Other abnormalities of gait and mobility  Abnormal posture  Other muscle spasm     Problem List Patient Active Problem List   Diagnosis Date Noted   Hyperlipidemia 08/06/2018   Coronary artery disease with exertional angina ([place]) 07/11/2017   CAD (coronary artery disease) 07/11/2017   Dyspnea 05/01/2016   Solitary pulmonary nodule 05/02/2015   Adverse effects of medication 02/14/2015   Cough 01/28/2015   Bronchiectasis without acute exacerbation ([place]) 01/28/2015   Shadow 01/25/2015   Ovarian ca ([place])    Osteopenia     Jerl Mina, PT 07/18/2021, 10:37 AM  [place], [place], [address] Phone: [phone]   Fax:  [phone]  Name: KHIYA FRIESE MRN: [id number] Date of Birth: [birth year]

## 2021-07-19 LAB — CULTURE, FUNGUS WITHOUT SMEAR
MICRO NUMBER:: 12683600
SPECIMEN QUALITY:: ADEQUATE

## 2021-07-24 ENCOUNTER — Encounter: Payer: Self-pay | Admitting: Dermatology

## 2021-07-25 DIAGNOSIS — N289 Disorder of kidney and ureter, unspecified: Secondary | ICD-10-CM | POA: Diagnosis not present

## 2021-07-25 DIAGNOSIS — I2510 Atherosclerotic heart disease of native coronary artery without angina pectoris: Secondary | ICD-10-CM | POA: Diagnosis not present

## 2021-07-25 DIAGNOSIS — Z90710 Acquired absence of both cervix and uterus: Secondary | ICD-10-CM | POA: Diagnosis not present

## 2021-07-25 DIAGNOSIS — E559 Vitamin D deficiency, unspecified: Secondary | ICD-10-CM | POA: Diagnosis not present

## 2021-07-25 DIAGNOSIS — M778 Other enthesopathies, not elsewhere classified: Secondary | ICD-10-CM | POA: Diagnosis not present

## 2021-07-25 DIAGNOSIS — C44719 Basal cell carcinoma of skin of left lower limb, including hip: Secondary | ICD-10-CM | POA: Diagnosis not present

## 2021-07-27 ENCOUNTER — Other Ambulatory Visit: Payer: Self-pay

## 2021-07-27 ENCOUNTER — Ambulatory Visit: Payer: Medicare Other | Attending: Orthopaedic Surgery | Admitting: Physical Therapy

## 2021-07-27 DIAGNOSIS — M62838 Other muscle spasm: Secondary | ICD-10-CM | POA: Diagnosis not present

## 2021-07-27 DIAGNOSIS — R2689 Other abnormalities of gait and mobility: Secondary | ICD-10-CM | POA: Insufficient documentation

## 2021-07-27 DIAGNOSIS — R293 Abnormal posture: Secondary | ICD-10-CM | POA: Diagnosis not present

## 2021-07-27 DIAGNOSIS — M533 Sacrococcygeal disorders, not elsewhere classified: Secondary | ICD-10-CM | POA: Diagnosis not present

## 2021-07-27 NOTE — Patient Instructions (Signed)
°  °  Letter T, [place] on one leg with band band under L foot, wrap by big toe then, outer knee/ thigh, L hand pulling ( elbow by side)  [place], toes spread, thigh out against band,, tracking knee in line with 2-3rd toe line Dipping forward, R foot lifts slight ( whole body like a see saw) off floor or  Press back foot against wall 20 reps  Both        ---   Figure 4 stretch- body turned at 45 deg on chair 10  breaths  Leaning forward  Place a pillow under the R knee

## 2021-07-27 NOTE — Therapy (Signed)
[place] MAIN [facility] SERVICES [address] [place], [place], [address] Phone: [phone]   Fax:  [phone]  Physical Therapy Treatment  Patient Details  Name: Barbara Schwartz MRN: [id number] Date of Birth: [birth year] Referring Provider (PT): Fara Olden MD   Encounter Date: 07/27/2021   PT End of Session - 07/27/21 1023     Visit Number 16    Date for PT Re-Evaluation 08/08/21   eval 03/23/21, PN 05/30/21   PT Start Time 0900    PT Stop Time 1010    PT Time Calculation (min) 70 min    Activity Tolerance Patient tolerated treatment well;No increased pain    Behavior During Therapy [facility] for tasks assessed/performed             Past Medical History:  Diagnosis Date   Atypical nevus 01/05/1997   slight-mod- 3rd & 4th toe web Left foot -WS   BCC (basal cell carcinoma) 03/17/2004   Rigth lower inner leg (CX35Fu)   BCC (basal cell carcinoma) 12/03/2012   left upper back (CX35FU)   BCC (basal cell carcinoma) x 2 04/09/2006   right forearm (MOHS) left scalp (MOHS)   Bowen's disease x 3 08/26/2015   Left calf medial- (CX35FU) , Left calf sup. (CX35FU), Left calf inf. (CX35FU)   bowens 05/05/1996   lower right shin/outer side (CX35FU)   CHF (congestive heart failure) (HCC)    Coronary artery disease    Dyspnea    with exercise   Nodular basal cell carcinoma (BCC) 07/04/2018   left side lower leg (MOHS)   Osteopenia 02/2019   T score -1.9 FRAX 18% / 9.6%   Ovarian cancer ([place]) 1990   Stage 3   SCC (squamous cell carcinoma) 08/17/2003   left ear (CX35FU)   SCC (squamous cell carcinoma) 03/17/2004   right ant. shin (CX35FU)   SCC (squamous cell carcinoma) 09/10/2017   left lower shin   SCC (squamous cell carcinoma) x 2 12/03/2012   left outer calf (CX35FU) right cheek (CX35FU)   SCC (squamous cell carcinoma) x 2 06/29/2014   right cheek, lateral (MOHS) Right cheek,medial (MOHS)   SCC (squamous cell carcinoma) x 3 07/04/2018    back of crown, inf, Left lower leg (CX35FU), Right lower calf (CX35FU)   Squamous cell carcinoma of skin 11/02/2019   in situ on right lower leg, posterior - CX3+5FU   Squamous cell carcinoma of skin 11/02/2019   in situ on left lower leg, posterior - CX3+5FU   Superficial basal cell carcinoma (BCC) 05/05/1996   left right shin inner side (CX35FU)   Superficial basal cell carcinoma (BCC) 08/24/2014   right submandibular (Dr Aundra Millet bx'd )    Past Surgical History:  Procedure Laterality Date   ABDOMINAL HYSTERECTOMY  1990   TAH BSO   Bowel obstruction     BREAST BIOPSY Right 11/27/2002   CARDIAC CATHETERIZATION  07/11/2017   INTRAVASCULAR PRESSURE WIRE/FFR STUDY N/A 07/11/2017   Procedure: INTRAVASCULAR PRESSURE WIRE/FFR STUDY;  Surgeon: Sherren Mocha, MD;  Location: [place] [facility];  Service: Cardiovascular;  Laterality: N/A;   OOPHORECTOMY     BSO   RIGHT/LEFT HEART CATH AND CORONARY ANGIOGRAPHY N/A 07/11/2017   Procedure: RIGHT/LEFT HEART CATH AND CORONARY ANGIOGRAPHY;  Surgeon: Sherren Mocha, MD;  Location: [place] [facility];  Service: Cardiovascular;  Laterality: N/A;   Squamous cell and Basal cell excision     VIDEO BRONCHOSCOPY Bilateral 02/15/2015   Procedure: VIDEO BRONCHOSCOPY WITH FLUORO;  Surgeon: Juanito Doom, MD;  Location: [place];  Service: Cardiopulmonary;  Laterality: Bilateral;    There were no vitals filed for this visit.   Subjective Assessment - 07/27/21 0910     Subjective Pt is feeling really good. Neck pain is not like it was at all. The new exericses are he;ping the glut area    Pertinent History Hx of ovarian CA with Tx of hysterectomy, chemotherapy,1990,  Skin squamous/basal cell carcinoma removal at top of head, face, legs,arms, eye lid,   CAD, SOB with exercises, 1 vaginal deliveries, Side effects from chemotherapy: loss of hearing ( wears hearing aids B), neuropathy of feet and hands. Hx of ankle sprains in B, Hx of falls x 3 in the  past 6 months ( 6-7 falls in one year).  Physical activities: Prior to getting COVID 02/2021, pt used to play tennis 3 x week, yoga, spin classes, 3 miles. Long COVID Sx include: dry cough and tiredness. Pt noticed her stamina has not returned with playing tennis by the last set in 90 min periods compared to being able to play for 2 -2.5 hours.    Patient Stated Goals work this pain and be back on the bike                [facility] PT Assessment - 07/27/21 1023       Palpation   Palpation comment tenderness/ tightness at obt int R, lateral leg, adductor, ankle mm                           [facility] Adult PT Treatment/Exercise - 07/27/21 1025       Neuro Re-ed    Neuro Re-ed Details  cued for CKC lower kientic chain alignment of feet, knee, hip to promote ER of thigh, abduction of knee, more co-activation transverse arch L      Modalities   Modalities Moist Heat      Manual Therapy   Manual therapy comments STM/MWM at problem areas noted in assessment to promote and lengthening adductor / ER of tibia                          PT Long Term Goals - 07/04/21 1927       PT LONG TERM GOAL #1   Title Pt will report decreased pain from 4/10 to  < 2/10 after 15 min of sitting on clinic soft chair and being able to sit on hard chairs with 50% less pain inorder to sit incomunity settings    Time 6    Period Weeks    Status Achieved      PT LONG TERM GOAL #2   Title Pt will have no flareups of pain in R buttock after walking on pavement for 30 min    Time 8    Period Weeks    Status Achieved      PT LONG TERM GOAL #3   Title Pt will be able ride a bike in spin class for 25 min and be compliant stretches and HEP in order to return to biking stationery bike    Time 10    Period Weeks    Status On-going      PT LONG TERM GOAL #4   Title Pt will demo no trunk lean and more levelled pelvis and shoulders, and space between rib and iliac crest will be equal in  order to  minimize pain with sitting and walking and biking  and progress to deep core exercises to maintain alignment    Baseline 3 fingers on R flank, 4 fingers L flank between lowest rib and iliac crest    Time 2    Period Weeks    Status Achieved      PT LONG TERM GOAL #5   Title Pt will improve her FOTO score from 57 pts to  > 67 pts inorder to demonstrate improved functional mobility  ( 06/13/21: 79 pts)    Baseline 57 pts    Time 10    Period Weeks    Status Achieved      PT LONG TERM GOAL #6   Title Pt will demonstrate decreased scar restrictions along abdominal scar in order to progress to more strengthening exercises for trunk stability for decreased risk of falls    Time 5    Period Weeks    Status Partially Met      PT LONG TERM GOAL #7   Title Pt will demo increased R hip abduction strength from 2/5 to > 4/5 and no tenderness at ischial tuberosity and glut med in order to improve pelvic stability for balance and mounting a bike.    Time 8    Period Weeks    Status On-going      PT LONG TERM GOAL #8   Title Pt will increase cervical mobility to look over shoulder when biking in order to minimize falling off bike and maintaining balance while walking    Baseline cervical rotation R 30 deg, L 40 deg,  flexion R 25 deg, L 30 deg,    Status On-going      PT LONG TERM GOAL  #9   TITLE Pt will increase cervical mm endurance test time from  2 min, 48 sec  -> > 3 min in order to minimize neck pain and demo no more forward head posture / throacic kyphosis    Time 4    Period Weeks    Status On-going                   Plan - 07/27/21 1023     Clinical Impression Statement Pt showed bilateral strength in hip abduction and extension today which is a milestone improvement.                         Pt continued to require further manual  Tx  to minimize tightness along obturator internus, adductor, lateral leg, medial and lateral ankle mm of RLE.   Plan to integrate  to stationery biking next session.  Pt continues to benefit form skilled PT   Personal Factors and Comorbidities Comorbidity 3+    Comorbidities Hx of ovarian CA with Tx of hysterectomy, chemotherapy,1990,  Skin squamous/basal cell carcinoma removal at top of head, face, legs,arms, eye lid, , CAD, SOB with exercises, 1 vaginal deliveries, Side effects from chemotherapy: loss of hearing ( wears hearing aids B), neuropathy of feet and hands. Hx of ankle sprains in B, Hx of falls x 3 in the past 6 months ( 6-7 falls in one year). Physical activities: Prior to getting COVID 02/2021, pt used to play tennis 3 x week, yoga, spin classes, 3 miles. Long COVID Sx include: dry coough and tiredness. Pt noticed her stamina has not returned with playing tennis by the last set in 90 min periods compared to being able to play for 2 -  2.5 hours    Examination-Activity Limitations Locomotion Level;Other    Stability/Clinical Decision Making Evolving/Moderate complexity    Rehab Potential Good    PT Frequency 1x / week    PT Duration Other (comment)   10   PT Treatment/Interventions Gait training;Stair training;Neuromuscular re-education;Moist Heat;Traction;Therapeutic activities;Therapeutic exercise;Patient/family education;Manual techniques;Energy conservation;Splinting;Taping;Cryotherapy;Balance training;ADLs/Self Care Home Management;Scar mobilization;Spinal Manipulations;Joint Manipulations;Functional mobility training    Consulted and Agree with Plan of Care Patient             Patient will benefit from skilled therapeutic intervention in order to improve the following deficits and impairments:  Decreased endurance, Decreased activity tolerance, Decreased balance, Difficulty walking, Impaired flexibility, Impaired vision/preception, Decreased strength, Decreased mobility, Decreased coordination, Decreased scar mobility, Abnormal gait, Improper body mechanics, Pain, Increased muscle spasms, Postural dysfunction,  Impaired perceived functional ability, Hypomobility, Decreased range of motion, Increased fascial restricitons  Visit Diagnosis: Sacrococcygeal disorders, not elsewhere classified  Other abnormalities of gait and mobility  Abnormal posture  Other muscle spasm     Problem List Patient Active Problem List   Diagnosis Date Noted   Hyperlipidemia 08/06/2018   Coronary artery disease with exertional angina ([place]) 07/11/2017   CAD (coronary artery disease) 07/11/2017   Dyspnea 05/01/2016   Solitary pulmonary nodule 05/02/2015   Adverse effects of medication 02/14/2015   Cough 01/28/2015   Bronchiectasis without acute exacerbation ([place]) 01/28/2015   Shadow 01/25/2015   Ovarian ca ([place])    Osteopenia     Jerl Mina, PT 07/27/2021, 11:05 AM  [place], [place], [address] Phone: [phone]   Fax:  [phone]  Name: Barbara Schwartz MRN: [id number] Date of Birth: 09/29/1945

## 2021-08-03 ENCOUNTER — Ambulatory Visit: Payer: Medicare Other | Admitting: Physical Therapy

## 2021-08-08 ENCOUNTER — Other Ambulatory Visit: Payer: Self-pay

## 2021-08-08 ENCOUNTER — Ambulatory Visit: Payer: Medicare Other | Admitting: Physical Therapy

## 2021-08-08 DIAGNOSIS — M62838 Other muscle spasm: Secondary | ICD-10-CM | POA: Diagnosis not present

## 2021-08-08 DIAGNOSIS — M533 Sacrococcygeal disorders, not elsewhere classified: Secondary | ICD-10-CM | POA: Diagnosis not present

## 2021-08-08 DIAGNOSIS — R293 Abnormal posture: Secondary | ICD-10-CM

## 2021-08-08 DIAGNOSIS — R2689 Other abnormalities of gait and mobility: Secondary | ICD-10-CM

## 2021-08-08 NOTE — Therapy (Signed)
[place] MAIN [facility] SERVICES [address] [place], [place], [address] Phone: [phone]   Fax:  [phone]  Physical Therapy Treatment  Patient Details  Name: Barbara Schwartz MRN: [id number] Date of Birth: 01-Aug-1945 Referring Provider (PT): Fara Olden MD   Encounter Date: 08/08/2021   PT End of Session - 08/08/21 1020     Visit Number 17    Date for PT Re-Evaluation 10/17/21   eval 03/23/21, PN 05/30/21   PT Start Time 0900    PT Stop Time 1000    PT Time Calculation (min) 60 min    Activity Tolerance Patient tolerated treatment well;No increased pain    Behavior During Therapy [facility] for tasks assessed/performed             Past Medical History:  Diagnosis Date   Atypical nevus 01/05/1997   slight-mod- 3rd & 4th toe web Left foot -WS   BCC (basal cell carcinoma) 03/17/2004   Rigth lower inner leg (CX35Fu)   BCC (basal cell carcinoma) 12/03/2012   left upper back (CX35FU)   BCC (basal cell carcinoma) x 2 04/09/2006   right forearm (MOHS) left scalp (MOHS)   Bowen's disease x 3 08/26/2015   Left calf medial- (CX35FU) , Left calf sup. (CX35FU), Left calf inf. (CX35FU)   bowens 05/05/1996   lower right shin/outer side (CX35FU)   CHF (congestive heart failure) (HCC)    Coronary artery disease    Dyspnea    with exercise   Nodular basal cell carcinoma (BCC) 07/04/2018   left side lower leg (MOHS)   Osteopenia 02/2019   T score -1.9 FRAX 18% / 9.6%   Ovarian cancer ([place]) 1990   Stage 3   SCC (squamous cell carcinoma) 08/17/2003   left ear (CX35FU)   SCC (squamous cell carcinoma) 03/17/2004   right ant. shin (CX35FU)   SCC (squamous cell carcinoma) 09/10/2017   left lower shin   SCC (squamous cell carcinoma) x 2 12/03/2012   left outer calf (CX35FU) right cheek (CX35FU)   SCC (squamous cell carcinoma) x 2 06/29/2014   right cheek, lateral (MOHS) Right cheek,medial (MOHS)   SCC (squamous cell carcinoma) x 3 07/04/2018    back of crown, inf, Left lower leg (CX35FU), Right lower calf (CX35FU)   Squamous cell carcinoma of skin 11/02/2019   in situ on right lower leg, posterior - CX3+5FU   Squamous cell carcinoma of skin 11/02/2019   in situ on left lower leg, posterior - CX3+5FU   Superficial basal cell carcinoma (BCC) 05/05/1996   left right shin inner side (CX35FU)   Superficial basal cell carcinoma (BCC) 08/24/2014   right submandibular (Dr Aundra Millet bx'd )    Past Surgical History:  Procedure Laterality Date   ABDOMINAL HYSTERECTOMY  1990   TAH BSO   Bowel obstruction     BREAST BIOPSY Right 11/27/2002   CARDIAC CATHETERIZATION  07/11/2017   INTRAVASCULAR PRESSURE WIRE/FFR STUDY N/A 07/11/2017   Procedure: INTRAVASCULAR PRESSURE WIRE/FFR STUDY;  Surgeon: Sherren Mocha, MD;  Location: [place] [facility];  Service: Cardiovascular;  Laterality: N/A;   OOPHORECTOMY     BSO   RIGHT/LEFT HEART CATH AND CORONARY ANGIOGRAPHY N/A 07/11/2017   Procedure: RIGHT/LEFT HEART CATH AND CORONARY ANGIOGRAPHY;  Surgeon: Sherren Mocha, MD;  Location: [place] [facility];  Service: Cardiovascular;  Laterality: N/A;   Squamous cell and Basal cell excision     VIDEO BRONCHOSCOPY Bilateral 02/15/2015   Procedure: VIDEO BRONCHOSCOPY WITH FLUORO;  Surgeon: Juanito Doom, MD;  Location: [place];  Service: Cardiopulmonary;  Laterality: Bilateral;    There were no vitals filed for this visit.   Subjective Assessment - 08/08/21 0911     Subjective Pt states her exercises are going real good . Pt leaves for her trip in 2 weeks for [place].     Pertinent History Hx of ovarian CA with Tx of hysterectomy, chemotherapy,1990,  Skin squamous/basal cell carcinoma removal at top of head, face, legs,arms, eye lid,   CAD, SOB with exercises, 1 vaginal deliveries, Side effects from chemotherapy: loss of hearing ( wears hearing aids B), neuropathy of feet and hands. Hx of ankle sprains in B, Hx of falls x 3 in the past 6 months (  6-7 falls in one year).  Physical activities: Prior to getting COVID 02/2021, pt used to play tennis 3 x week, yoga, spin classes, 3 miles. Long COVID Sx include: dry cough and tiredness. Pt noticed her stamina has not returned with playing tennis by the last set in 90 min periods compared to being able to play for 2 -2.5 hours.    Patient Stated Goals work this pain and be back on the bike                [facility] PT Assessment - 08/08/21 0932       Other:   Other/ Comments stepping up and down low curb and high curb with poor eccentric control, limited stepping strategy with perturbation at waist in 4 directions , walking with head turns - no difficulty , changing speed with no difficulty      Strength   Overall Strength Comments hip abd, ext 4+/5 B      Palpation   Palpation comment tightness/ tenderness at ischial rami, hamstring attachments, Jacquelin Hawking Adult PT Treatment/Exercise - 08/08/21 0933       Therapeutic Activites    Other Therapeutic Activities balance training with perturbation at waist, step up and down at variable height curbs with single UE support, stepping over object, walking with head turns, practice on stairs (totatl 25')      Neuro Re-ed    Neuro Re-ed Details  cued for eccentric control of plantar flexors with step downs      Manual Therapy   Manual therapy comments STM/MWM at problem areas noted in assessment to promote  mobility at mm and anterior rotation of ilia R                          PT Long Term Goals - 08/08/21 1021       PT LONG TERM GOAL #1   Title Pt will report decreased pain from 4/10 to  < 2/10 after 15 min of sitting on clinic soft chair and being able to sit on hard chairs with 50% less pain inorder to sit incomunity settings    Time 6    Period Weeks    Status Achieved      PT LONG TERM GOAL #2   Title Pt will have no flareups of pain in R buttock after walking on  pavement for 30 min    Time 8    Period Weeks    Status Achieved      PT LONG TERM GOAL #3   Title  Pt will be able ride a bike in spin class for 25 min and be compliant stretches and HEP in order to return to biking stationery bike    Time 10    Period Weeks    Status On-going      PT LONG TERM GOAL #4   Title Pt will demo no trunk lean and more levelled pelvis and shoulders, and space between rib and iliac crest will be equal in order to minimize pain with sitting and walking and biking  and progress to deep core exercises to maintain alignment    Baseline 3 fingers on R flank, 4 fingers L flank between lowest rib and iliac crest    Time 2    Period Weeks    Status Achieved      PT LONG TERM GOAL #5   Title Pt will improve her FOTO score from 57 pts to  > 67 pts inorder to demonstrate improved functional mobility  ( 06/13/21: 79 pts)    Baseline 57 pts    Time 10    Period Weeks    Status Achieved      PT LONG TERM GOAL #6   Title Pt will demonstrate decreased scar restrictions along abdominal scar in order to progress to more strengthening exercises for trunk stability for decreased risk of falls    Time 5    Period Weeks    Status Achieved      PT LONG TERM GOAL #7   Title Pt will demo increased R hip abduction strength from 2/5 to > 4/5 and no tenderness at ischial tuberosity and glut med in order to improve pelvic stability for balance and mounting a bike. 91/17/23: 4/5)    Time 8    Period Weeks    Status Achieved      PT LONG TERM GOAL #8   Title Pt will increase cervical mobility to look over shoulder when biking in order to minimize falling off bike and maintaining balance while walking    Baseline cervical rotation R 30 deg, L 40 deg,  flexion R 25 deg, L 30 deg,    Status On-going      PT LONG TERM GOAL  #9   TITLE Pt will increase cervical mm endurance test time from  2 min, 48 sec  -> > 3 min in order to minimize neck pain and demo no more forward head posture  / throacic kyphosis    Time 4    Period Weeks    Status On-going                   Plan - 08/08/21 1021     Clinical Impression Statement Pt has achieved 6/9 goals and progressing well towards remaining goals. Pt has demonstrated bilateral strength in hip extension and abduction and the RLE is no longer weaker. Back mm are stronger and she continues to stand without scoliotic curves nor thoracic kyphosis.   Pt still required manual Tx to promote SIJ, IT band, hamstring on RLE. Pt progressed to dynamic balance exercises today to prepare for unexpected challenges to balance in the community settings that simulate stepping up/ down curbs and perturbation to elicit a stepping strategy, walking with head turns to minimize falls. Pt was cued for slow and eccentric control of plantar flexion with stepping down.   Plan to add static balance training and consolidate HEP . Pt will be travelling to [place] after next session. Plan to also add lifting luggage  and body  mechanics to prepare for her trip. After she returns from trip, plan to integrate pt to stationary cycling to help her achieve remaining goals.  Pt benefit from skilled PT.    Personal Factors and Comorbidities Comorbidity 3+    Comorbidities Hx of ovarian CA with Tx of hysterectomy, chemotherapy,1990,  Skin squamous/basal cell carcinoma removal at top of head, face, legs,arms, eye lid, , CAD, SOB with exercises, 1 vaginal deliveries, Side effects from chemotherapy: loss of hearing ( wears hearing aids B), neuropathy of feet and hands. Hx of ankle sprains in B, Hx of falls x 3 in the past 6 months ( 6-7 falls in one year). Physical activities: Prior to getting COVID 02/2021, pt used to play tennis 3 x week, yoga, spin classes, 3 miles. Long COVID Sx include: dry coough and tiredness. Pt noticed her stamina has not returned with playing tennis by the last set in 90 min periods compared to being able to play for 2 -2.5 hours     Examination-Activity Limitations Locomotion Level;Other    Stability/Clinical Decision Making Evolving/Moderate complexity    Rehab Potential Good    PT Frequency 1x / week    PT Duration Other (comment)   10   PT Treatment/Interventions Gait training;Stair training;Neuromuscular re-education;Moist Heat;Traction;Therapeutic activities;Therapeutic exercise;Patient/family education;Manual techniques;Energy conservation;Splinting;Taping;Cryotherapy;Balance training;ADLs/Self Care Home Management;Scar mobilization;Spinal Manipulations;Joint Manipulations;Functional mobility training    Consulted and Agree with Plan of Care Patient             Patient will benefit from skilled therapeutic intervention in order to improve the following deficits and impairments:  Decreased endurance, Decreased activity tolerance, Decreased balance, Difficulty walking, Impaired flexibility, Impaired vision/preception, Decreased strength, Decreased mobility, Decreased coordination, Decreased scar mobility, Abnormal gait, Improper body mechanics, Pain, Increased muscle spasms, Postural dysfunction, Impaired perceived functional ability, Hypomobility, Decreased range of motion, Increased fascial restricitons  Visit Diagnosis: Other abnormalities of gait and mobility  Abnormal posture  Other muscle spasm  Sacrococcygeal disorders, not elsewhere classified     Problem List Patient Active Problem List   Diagnosis Date Noted   Hyperlipidemia 08/06/2018   Coronary artery disease with exertional angina ([place]) 07/11/2017   CAD (coronary artery disease) 07/11/2017   Dyspnea 05/01/2016   Solitary pulmonary nodule 05/02/2015   Adverse effects of medication 02/14/2015   Cough 01/28/2015   Bronchiectasis without acute exacerbation ([place]) 01/28/2015   Shadow 01/25/2015   Ovarian ca ([place])    Osteopenia     Jerl Mina, PT 08/08/2021, 10:22 AM  [place] MAIN [facility]  SERVICES [place], [place], [address] Phone: [phone]   Fax:  [phone]  Name: ALEECIA TAPIA MRN: [id number] Date of Birth: 02/11/1946

## 2021-08-08 NOTE — Patient Instructions (Signed)
Step up with R, halfway on the step to strengthen the plantar arches  Step up with L, lower R ballmound down, place L foot hip width apart to start again  Hand on rail   20 reps each side   __  Be aware of using stepping strategy if need to catch balance Drop low, widen arms

## 2021-08-17 ENCOUNTER — Other Ambulatory Visit: Payer: Self-pay

## 2021-08-17 ENCOUNTER — Ambulatory Visit: Payer: Medicare Other | Admitting: Physical Therapy

## 2021-08-17 DIAGNOSIS — M62838 Other muscle spasm: Secondary | ICD-10-CM

## 2021-08-17 DIAGNOSIS — M533 Sacrococcygeal disorders, not elsewhere classified: Secondary | ICD-10-CM

## 2021-08-17 DIAGNOSIS — R2689 Other abnormalities of gait and mobility: Secondary | ICD-10-CM

## 2021-08-17 DIAGNOSIS — R293 Abnormal posture: Secondary | ICD-10-CM

## 2021-08-17 NOTE — Patient Instructions (Signed)
°  ____  Neck / shoulder stretches:    Lying on back - small sushi roll towel under neck  _ 6 directions  5 reps  _elbow circles in and out to lower shoulder blades down and back  10 reps  _angel wings, lower elbows down , keep arms touching bed  10 reps    At work:  _Doorway squat : bear stretch for shoulder blades  _wall stretch Clock stretch with head turns :  stand perpendicular to the wall, L side to the wall Tilt head to wall  Place L palm at 7 o clock Chin tuck like you are looking into armpit Look at "[place] on giant map  Swivel head with chin tucked to look in upper corner of ceiling as if you are look at  [place] on giant map "   5 reps   Switch direction, R palm on wall at 5 o 'clock   Chin tuck like you are looking into armpit Look at "[place]  on giant map  Swivel head with chin tucked to look in upper corner of ceiling as if you are look at  [facility] on giant map "   5 reps    ___  Midback stretches:   Elbow circles with hands on shoulders  10 reps    Side lean and then a palm up (high five to the sky)  And then slight rotation of ribs by looking up to the sky) 5 reps    armswings without moving the hips, sink into the legs and feet 5 reps     ___   kitchen counter stretches  Hands on kitchen counter,   Palms shoulder width apart  Minisquat postion Trunk is parallel to floor  A) Pull buttocks back to lengthen spine, knees bent  3 breaths   B) Bring R hand to the L, and stretch the R side trunk  3 breaths   Brings hands to center again Do the same to the L side stretch by placing L hand on top of R   D) Modified thread the needle R hand on L thigh, L  thigh pushing out slightly as the R hands pull in,  elbow bent and pulls to theR,  Look under L armpit   Do the same to other side  ____

## 2021-08-17 NOTE — Therapy (Signed)
[place] MAIN [facility] SERVICES [address] [place], [place], [address] Phone: [phone]   Fax:  [phone]  Physical Therapy Treatment  Patient Details  Name: Barbara Schwartz MRN: [id number] Date of Birth: [birth year] Referring Provider (PT): Fara Olden MD   Encounter Date: 08/17/2021   PT End of Session - 08/17/21 1343     Visit Number 18    Date for PT Re-Evaluation 10/17/21   eval 03/23/21, PN 05/30/21   PT Start Time 0907    PT Stop Time 1006    PT Time Calculation (min) 59 min    Activity Tolerance Patient tolerated treatment well;No increased pain    Behavior During Therapy [facility] for tasks assessed/performed             Past Medical History:  Diagnosis Date   Atypical nevus 01/05/1997   slight-mod- 3rd & 4th toe web Left foot -WS   BCC (basal cell carcinoma) 03/17/2004   Rigth lower inner leg (CX35Fu)   BCC (basal cell carcinoma) 12/03/2012   left upper back (CX35FU)   BCC (basal cell carcinoma) x 2 04/09/2006   right forearm (MOHS) left scalp (MOHS)   Bowen's disease x 3 08/26/2015   Left calf medial- (CX35FU) , Left calf sup. (CX35FU), Left calf inf. (CX35FU)   bowens 05/05/1996   lower right shin/outer side (CX35FU)   CHF (congestive heart failure) (HCC)    Coronary artery disease    Dyspnea    with exercise   Nodular basal cell carcinoma (BCC) 07/04/2018   left side lower leg (MOHS)   Osteopenia 02/2019   T score -1.9 FRAX 18% / 9.6%   Ovarian cancer ([place]) 1990   Stage 3   SCC (squamous cell carcinoma) 08/17/2003   left ear (CX35FU)   SCC (squamous cell carcinoma) 03/17/2004   right ant. shin (CX35FU)   SCC (squamous cell carcinoma) 09/10/2017   left lower shin   SCC (squamous cell carcinoma) x 2 12/03/2012   left outer calf (CX35FU) right cheek (CX35FU)   SCC (squamous cell carcinoma) x 2 06/29/2014   right cheek, lateral (MOHS) Right cheek,medial (MOHS)   SCC (squamous cell carcinoma) x 3 07/04/2018    back of crown, inf, Left lower leg (CX35FU), Right lower calf (CX35FU)   Squamous cell carcinoma of skin 11/02/2019   in situ on right lower leg, posterior - CX3+5FU   Squamous cell carcinoma of skin 11/02/2019   in situ on left lower leg, posterior - CX3+5FU   Superficial basal cell carcinoma (BCC) 05/05/1996   left right shin inner side (CX35FU)   Superficial basal cell carcinoma (BCC) 08/24/2014   right submandibular (Dr Aundra Millet bx'd )    Past Surgical History:  Procedure Laterality Date   ABDOMINAL HYSTERECTOMY  1990   TAH BSO   Bowel obstruction     BREAST BIOPSY Right 11/27/2002   CARDIAC CATHETERIZATION  07/11/2017   INTRAVASCULAR PRESSURE WIRE/FFR STUDY N/A 07/11/2017   Procedure: INTRAVASCULAR PRESSURE WIRE/FFR STUDY;  Surgeon: Sherren Mocha, MD;  Location: [place] [facility];  Service: Cardiovascular;  Laterality: N/A;   OOPHORECTOMY     BSO   RIGHT/LEFT HEART CATH AND CORONARY ANGIOGRAPHY N/A 07/11/2017   Procedure: RIGHT/LEFT HEART CATH AND CORONARY ANGIOGRAPHY;  Surgeon: Sherren Mocha, MD;  Location: [place] [facility];  Service: Cardiovascular;  Laterality: N/A;   Squamous cell and Basal cell excision     VIDEO BRONCHOSCOPY Bilateral 02/15/2015   Procedure: VIDEO BRONCHOSCOPY WITH FLUORO;  Surgeon: Juanito Doom, MD;  Location: [place];  Service: Cardiopulmonary;  Laterality: Bilateral;    There were no vitals filed for this visit.   Subjective Assessment - 08/17/21 1344     Subjective Pt has neck issues today. She leaves for her trip to [place] on Monday    Pertinent History Hx of ovarian CA with Tx of hysterectomy, chemotherapy,1990,  Skin squamous/basal cell carcinoma removal at top of head, face, legs,arms, eye lid,   CAD, SOB with exercises, 1 vaginal deliveries, Side effects from chemotherapy: loss of hearing ( wears hearing aids B), neuropathy of feet and hands. Hx of ankle sprains in B, Hx of falls x 3 in the past 6 months ( 6-7 falls in one year).   Physical activities: Prior to getting COVID 02/2021, pt used to play tennis 3 x week, yoga, spin classes, 3 miles. Long COVID Sx include: dry cough and tiredness. Pt noticed her stamina has not returned with playing tennis by the last set in 90 min periods compared to being able to play for 2 -2.5 hours.    Patient Stated Goals work this pain and be back on the bike                [facility] PT Assessment - 08/17/21 1040       Palpation   SI assessment  levelled pelvic girdle, R shoulder higher than L    Palpation comment tightness/tenderness at T12, intercostals posterior between lower ribs , interspinals B                           [facility] Adult PT Treatment/Exercise - 08/17/21 1038       Neuro Re-ed    Neuro Re-ed Details  cued for standing neck/ thoracic / lumbar stretches      Modalities   Modalities Moist Heat   unbilled     Moist Heat Therapy   Number Minutes Moist Heat 5 Minutes    Moist Heat Location --   thoracic     Manual Therapy   Manual therapy comments STM/MWM at problem areas noted in assessment to promote mobility at thoracic spine, intercostals                          PT Long Term Goals - 08/08/21 1021       PT LONG TERM GOAL #1   Title Pt will report decreased pain from 4/10 to  < 2/10 after 15 min of sitting on clinic soft chair and being able to sit on hard chairs with 50% less pain inorder to sit incomunity settings    Time 6    Period Weeks    Status Achieved      PT LONG TERM GOAL #2   Title Pt will have no flareups of pain in R buttock after walking on pavement for 30 min    Time 8    Period Weeks    Status Achieved      PT LONG TERM GOAL #3   Title Pt will be able ride a bike in spin class for 25 min and be compliant stretches and HEP in order to return to biking stationery bike    Time 10    Period Weeks    Status On-going      PT LONG TERM GOAL #4   Title Pt will demo no trunk lean and more levelled  pelvis  and shoulders, and space between rib and iliac crest will be equal in order to minimize pain with sitting and walking and biking  and progress to deep core exercises to maintain alignment    Baseline 3 fingers on R flank, 4 fingers L flank between lowest rib and iliac crest    Time 2    Period Weeks    Status Achieved      PT LONG TERM GOAL #5   Title Pt will improve her FOTO score from 57 pts to  > 67 pts inorder to demonstrate improved functional mobility  ( 06/13/21: 79 pts)    Baseline 57 pts    Time 10    Period Weeks    Status Achieved      PT LONG TERM GOAL #6   Title Pt will demonstrate decreased scar restrictions along abdominal scar in order to progress to more strengthening exercises for trunk stability for decreased risk of falls    Time 5    Period Weeks    Status Achieved      PT LONG TERM GOAL #7   Title Pt will demo increased R hip abduction strength from 2/5 to > 4/5 and no tenderness at ischial tuberosity and glut med in order to improve pelvic stability for balance and mounting a bike. 91/17/23: 4/5)    Time 8    Period Weeks    Status Achieved      PT LONG TERM GOAL #8   Title Pt will increase cervical mobility to look over shoulder when biking in order to minimize falling off bike and maintaining balance while walking    Baseline cervical rotation R 30 deg, L 40 deg,  flexion R 25 deg, L 30 deg,    Status On-going      PT LONG TERM GOAL  #9   TITLE Pt will increase cervical mm endurance test time from  2 min, 48 sec  -> > 3 min in order to minimize neck pain and demo no more forward head posture / throacic kyphosis    Time 4    Period Weeks    Status On-going                   Plan - 08/17/21 1343     Clinical Impression Statement Pt showed higher R shoulder with tightness of posterior thoracic mm which improved post Tx. Discussed with pt about sleeping on lower pillows as pt reported her pillow at home is high. Provided upright versions of  neck and midback stretches so pt can perform more mobility HEP while traveling without needing to get on floor for the stretches. Pt showed no more tightness at gluts/ SIJ. Pt also has not had any glut pain which signifies HEP and past Tx have been effective. Pt will return to PT after her trip [place] . Plan to integrate pt to biking. Pt continues to benefit from skilled PT   Personal Factors and Comorbidities Comorbidity 3+    Comorbidities Hx of ovarian CA with Tx of hysterectomy, chemotherapy,1990,  Skin squamous/basal cell carcinoma removal at top of head, face, legs,arms, eye lid, , CAD, SOB with exercises, 1 vaginal deliveries, Side effects from chemotherapy: loss of hearing ( wears hearing aids B), neuropathy of feet and hands. Hx of ankle sprains in B, Hx of falls x 3 in the past 6 months ( 6-7 falls in one year). Physical activities: Prior to getting COVID 02/2021, pt used to play tennis 3 x  week, yoga, spin classes, 3 miles. Long COVID Sx include: dry coough and tiredness. Pt noticed her stamina has not returned with playing tennis by the last set in 90 min periods compared to being able to play for 2 -2.5 hours    Examination-Activity Limitations Locomotion Level;Other    Stability/Clinical Decision Making Evolving/Moderate complexity    Rehab Potential Good    PT Frequency 1x / week    PT Duration Other (comment)   10   PT Treatment/Interventions Gait training;Stair training;Neuromuscular re-education;Moist Heat;Traction;Therapeutic activities;Therapeutic exercise;Patient/family education;Manual techniques;Energy conservation;Splinting;Taping;Cryotherapy;Balance training;ADLs/Self Care Home Management;Scar mobilization;Spinal Manipulations;Joint Manipulations;Functional mobility training    Consulted and Agree with Plan of Care Patient             Patient will benefit from skilled therapeutic intervention in order to improve the following deficits and impairments:  Decreased endurance,  Decreased activity tolerance, Decreased balance, Difficulty walking, Impaired flexibility, Impaired vision/preception, Decreased strength, Decreased mobility, Decreased coordination, Decreased scar mobility, Abnormal gait, Improper body mechanics, Pain, Increased muscle spasms, Postural dysfunction, Impaired perceived functional ability, Hypomobility, Decreased range of motion, Increased fascial restricitons  Visit Diagnosis: Other abnormalities of gait and mobility  Sacrococcygeal disorders, not elsewhere classified  Abnormal posture  Other muscle spasm     Problem List Patient Active Problem List   Diagnosis Date Noted   Hyperlipidemia 08/06/2018   Coronary artery disease with exertional angina ([place]) 07/11/2017   CAD (coronary artery disease) 07/11/2017   Dyspnea 05/01/2016   Solitary pulmonary nodule 05/02/2015   Adverse effects of medication 02/14/2015   Cough 01/28/2015   Bronchiectasis without acute exacerbation ([place]) 01/28/2015   Shadow 01/25/2015   Ovarian ca ([place])    Osteopenia     Jerl Mina, PT 08/17/2021, 1:51 PM  [place] MAIN [facility] SERVICES [place], [place], [address] Phone: [phone]   Fax:  [phone]  Name: Barbara Schwartz MRN: [id number] Date of Birth: October 24, 1945

## 2021-09-10 ENCOUNTER — Other Ambulatory Visit (HOSPITAL_COMMUNITY): Payer: Self-pay | Admitting: Internal Medicine

## 2021-09-12 ENCOUNTER — Ambulatory Visit: Payer: Medicare Other | Attending: Orthopaedic Surgery | Admitting: Physical Therapy

## 2021-09-12 ENCOUNTER — Other Ambulatory Visit: Payer: Self-pay

## 2021-09-12 DIAGNOSIS — R293 Abnormal posture: Secondary | ICD-10-CM | POA: Diagnosis not present

## 2021-09-12 DIAGNOSIS — M533 Sacrococcygeal disorders, not elsewhere classified: Secondary | ICD-10-CM | POA: Diagnosis not present

## 2021-09-12 DIAGNOSIS — M62838 Other muscle spasm: Secondary | ICD-10-CM | POA: Diagnosis not present

## 2021-09-12 DIAGNOSIS — R2689 Other abnormalities of gait and mobility: Secondary | ICD-10-CM | POA: Diagnosis not present

## 2021-09-12 DIAGNOSIS — M4125 Other idiopathic scoliosis, thoracolumbar region: Secondary | ICD-10-CM | POA: Insufficient documentation

## 2021-09-12 NOTE — Therapy (Signed)
[place] MAIN [facility] SERVICES [address] [place], [place], [address] Phone: [phone]   Fax:  [phone]  Physical Therapy Treatment  Patient Details  Name: Barbara Schwartz MRN: [id number] Date of Birth: [birth year] Referring Provider (PT): Fara Olden MD   Encounter Date: 09/12/2021   PT End of Session - 09/12/21 0914     Visit Number 19    Date for PT Re-Evaluation 10/17/21   eval 03/23/21, PN 05/30/21   PT Start Time 0907    PT Stop Time 1000    PT Time Calculation (min) 53 min    Activity Tolerance Patient tolerated treatment well;No increased pain    Behavior During Therapy [facility] for tasks assessed/performed             Past Medical History:  Diagnosis Date   Atypical nevus 01/05/1997   slight-mod- 3rd & 4th toe web Left foot -WS   BCC (basal cell carcinoma) 03/17/2004   Rigth lower inner leg (CX35Fu)   BCC (basal cell carcinoma) 12/03/2012   left upper back (CX35FU)   BCC (basal cell carcinoma) x 2 04/09/2006   right forearm (MOHS) left scalp (MOHS)   Bowen's disease x 3 08/26/2015   Left calf medial- (CX35FU) , Left calf sup. (CX35FU), Left calf inf. (CX35FU)   bowens 05/05/1996   lower right shin/outer side (CX35FU)   CHF (congestive heart failure) (HCC)    Coronary artery disease    Dyspnea    with exercise   Nodular basal cell carcinoma (BCC) 07/04/2018   left side lower leg (MOHS)   Osteopenia 02/2019   T score -1.9 FRAX 18% / 9.6%   Ovarian cancer ([place]) 1990   Stage 3   SCC (squamous cell carcinoma) 08/17/2003   left ear (CX35FU)   SCC (squamous cell carcinoma) 03/17/2004   right ant. shin (CX35FU)   SCC (squamous cell carcinoma) 09/10/2017   left lower shin   SCC (squamous cell carcinoma) x 2 12/03/2012   left outer calf (CX35FU) right cheek (CX35FU)   SCC (squamous cell carcinoma) x 2 06/29/2014   right cheek, lateral (MOHS) Right cheek,medial (MOHS)   SCC (squamous cell carcinoma) x 3 07/04/2018    back of crown, inf, Left lower leg (CX35FU), Right lower calf (CX35FU)   Squamous cell carcinoma of skin 11/02/2019   in situ on right lower leg, posterior - CX3+5FU   Squamous cell carcinoma of skin 11/02/2019   in situ on left lower leg, posterior - CX3+5FU   Superficial basal cell carcinoma (BCC) 05/05/1996   left right shin inner side (CX35FU)   Superficial basal cell carcinoma (BCC) 08/24/2014   right submandibular (Dr Aundra Millet bx'd )    Past Surgical History:  Procedure Laterality Date   ABDOMINAL HYSTERECTOMY  1990   TAH BSO   Bowel obstruction     BREAST BIOPSY Right 11/27/2002   CARDIAC CATHETERIZATION  07/11/2017   INTRAVASCULAR PRESSURE WIRE/FFR STUDY N/A 07/11/2017   Procedure: INTRAVASCULAR PRESSURE WIRE/FFR STUDY;  Surgeon: Sherren Mocha, MD;  Location: [place] [facility];  Service: Cardiovascular;  Laterality: N/A;   OOPHORECTOMY     BSO   RIGHT/LEFT HEART CATH AND CORONARY ANGIOGRAPHY N/A 07/11/2017   Procedure: RIGHT/LEFT HEART CATH AND CORONARY ANGIOGRAPHY;  Surgeon: Sherren Mocha, MD;  Location: [place] [facility];  Service: Cardiovascular;  Laterality: N/A;   Squamous cell and Basal cell excision     VIDEO BRONCHOSCOPY Bilateral 02/15/2015   Procedure: VIDEO BRONCHOSCOPY WITH FLUORO;  Surgeon: Juanito Doom, MD;  Location: [place];  Service: Cardiopulmonary;  Laterality: Bilateral;    There were no vitals filed for this visit.   Subjective Assessment - 09/12/21 0915     Subjective Pt returned from her trip to [place]. Pt had a fall while crossing the street but did not break anything.  Pt was slow the next day. Pt  was able to hike rugged terrain. Her R knee hurts stil and she can not kneel on her R knee. The bruising and swelling is all down.    Pertinent History Hx of ovarian CA with Tx of hysterectomy, chemotherapy,1990,  Skin squamous/basal cell carcinoma removal at top of head, face, legs,arms, eye lid,   CAD, SOB with exercises, 1 vaginal  deliveries, Side effects from chemotherapy: loss of hearing ( wears hearing aids B), neuropathy of feet and hands. Hx of ankle sprains in B, Hx of falls x 3 in the past 6 months ( 6-7 falls in one year).  Physical activities: Prior to getting COVID 02/2021, pt used to play tennis 3 x week, yoga, spin classes, 3 miles. Long COVID Sx include: dry cough and tiredness. Pt noticed her stamina has not returned with playing tennis by the last set in 90 min periods compared to being able to play for 2 -2.5 hours.    Patient Stated Goals work this pain and be back on the bike                [facility] PT Assessment - 09/12/21 0930       Observation/Other Assessments   Observations stepping with hip abduction caused R knee pain      Strength   Overall Strength Comments BLE 4+/5, R hip abd 3+/5, L 4-/5, hip ext B 4+/5      Palpation   SI assessment  levelled pelvic girdle    Palpation comment tightness/tenderness at lateral hip and medial knee R , IR noted                           [facility] Adult PT Treatment/Exercise - 09/12/21 1013       Neuro Re-ed    Neuro Re-ed Details  Cued for alignment of knees in sidestepping exericse to prevent valgus      Modalities   Modalities Moist Heat   unbilled     Moist Heat Therapy   Number Minutes Moist Heat 5 Minutes    Moist Heat Location --   R hip     Manual Therapy   Manual therapy comments STM/MWM at problem areas noted assessment to promote ER of hip/ knee R                          PT Long Term Goals - 08/08/21 1021       PT LONG TERM GOAL #1   Title Pt will report decreased pain from 4/10 to  < 2/10 after 15 min of sitting on clinic soft chair and being able to sit on hard chairs with 50% less pain inorder to sit incomunity settings    Time 6    Period Weeks    Status Achieved      PT LONG TERM GOAL #2   Title Pt will have no flareups of pain in R buttock after walking on pavement for 30 min    Time 8     Period Weeks  Status Achieved      PT LONG TERM GOAL #3   Title Pt will be able ride a bike in spin class for 25 min and be compliant stretches and HEP in order to return to biking stationery bike    Time 10    Period Weeks    Status On-going      PT LONG TERM GOAL #4   Title Pt will demo no trunk lean and more levelled pelvis and shoulders, and space between rib and iliac crest will be equal in order to minimize pain with sitting and walking and biking  and progress to deep core exercises to maintain alignment    Baseline 3 fingers on R flank, 4 fingers L flank between lowest rib and iliac crest    Time 2    Period Weeks    Status Achieved      PT LONG TERM GOAL #5   Title Pt will improve her FOTO score from 57 pts to  > 67 pts inorder to demonstrate improved functional mobility  ( 06/13/21: 79 pts)    Baseline 57 pts    Time 10    Period Weeks    Status Achieved      PT LONG TERM GOAL #6   Title Pt will demonstrate decreased scar restrictions along abdominal scar in order to progress to more strengthening exercises for trunk stability for decreased risk of falls    Time 5    Period Weeks    Status Achieved      PT LONG TERM GOAL #7   Title Pt will demo increased R hip abduction strength from 2/5 to > 4/5 and no tenderness at ischial tuberosity and glut med in order to improve pelvic stability for balance and mounting a bike. 91/17/23: 4/5)    Time 8    Period Weeks    Status Achieved      PT LONG TERM GOAL #8   Title Pt will increase cervical mobility to look over shoulder when biking in order to minimize falling off bike and maintaining balance while walking    Baseline cervical rotation R 30 deg, L 40 deg,  flexion R 25 deg, L 30 deg,    Status On-going      PT LONG TERM GOAL  #9   TITLE Pt will increase cervical mm endurance test time from  2 min, 48 sec  -> > 3 min in order to minimize neck pain and demo no more forward head posture / throacic kyphosis    Time 4     Period Weeks    Status On-going                   Plan - 09/12/21 0915     Clinical Impression Statement Pt returned from her trip to [place]. Pt had a fall while crossing the street but did not break anything.  Pt was slow the next day. Pt  was able to hike rugged terrain. Her R knee hurts stil and she can not kneel on her R knee. Assessed pelvic and knee alignment today which showed no misalignment of pelvic girdle but slight IR of hip and knee on R. Pt tolerated manual Tx with modifications to pressure and technique when she reported tenderness/ pain. Pt demo'd improved ER of knee and had no pain with sidestepping activity for increasing hip abduction strength in CKC position. Plan to gradually get pt on stationary bike to train her for biking on a  trip in July. Pt demo'd improved abduction of knee in new HEP post training with verbal and tactile cues. Pt continues to benefit from skilled PT.     Personal Factors and Comorbidities Comorbidity 3+    Comorbidities Hx of ovarian CA with Tx of hysterectomy, chemotherapy,1990,  Skin squamous/basal cell carcinoma removal at top of head, face, legs,arms, eye lid, , CAD, SOB with exercises, 1 vaginal deliveries, Side effects from chemotherapy: loss of hearing ( wears hearing aids B), neuropathy of feet and hands. Hx of ankle sprains in B, Hx of falls x 3 in the past 6 months ( 6-7 falls in one year). Physical activities: Prior to getting COVID 02/2021, pt used to play tennis 3 x week, yoga, spin classes, 3 miles. Long COVID Sx include: dry coough and tiredness. Pt noticed her stamina has not returned with playing tennis by the last set in 90 min periods compared to being able to play for 2 -2.5 hours    Examination-Activity Limitations Locomotion Level;Other    Stability/Clinical Decision Making Evolving/Moderate complexity    Rehab Potential Good    PT Frequency 1x / week    PT Duration Other (comment)   10   PT Treatment/Interventions Gait  training;Stair training;Neuromuscular re-education;Moist Heat;Traction;Therapeutic activities;Therapeutic exercise;Patient/family education;Manual techniques;Energy conservation;Splinting;Taping;Cryotherapy;Balance training;ADLs/Self Care Home Management;Scar mobilization;Spinal Manipulations;Joint Manipulations;Functional mobility training    Consulted and Agree with Plan of Care Patient             Patient will benefit from skilled therapeutic intervention in order to improve the following deficits and impairments:  Decreased endurance, Decreased activity tolerance, Decreased balance, Difficulty walking, Impaired flexibility, Impaired vision/preception, Decreased strength, Decreased mobility, Decreased coordination, Decreased scar mobility, Abnormal gait, Improper body mechanics, Pain, Increased muscle spasms, Postural dysfunction, Impaired perceived functional ability, Hypomobility, Decreased range of motion, Increased fascial restricitons  Visit Diagnosis: Sacrococcygeal disorders, not elsewhere classified  Other abnormalities of gait and mobility  Abnormal posture  Other muscle spasm     Problem List Patient Active Problem List   Diagnosis Date Noted   Hyperlipidemia 08/06/2018   Coronary artery disease with exertional angina ([place]) 07/11/2017   CAD (coronary artery disease) 07/11/2017   Dyspnea 05/01/2016   Solitary pulmonary nodule 05/02/2015   Adverse effects of medication 02/14/2015   Cough 01/28/2015   Bronchiectasis without acute exacerbation ([place]) 01/28/2015   Shadow 01/25/2015   Ovarian ca ([place])    Osteopenia     Jerl Mina, PT 09/12/2021, 10:55 AM  [place], [place], [address] Phone: [phone]   Fax:  [phone]  Name: Barbara Schwartz MRN: [id number] Date of Birth: September 24, 1945

## 2021-09-19 ENCOUNTER — Other Ambulatory Visit: Payer: Self-pay

## 2021-09-19 ENCOUNTER — Ambulatory Visit: Payer: Medicare Other | Admitting: Physical Therapy

## 2021-09-19 DIAGNOSIS — R293 Abnormal posture: Secondary | ICD-10-CM | POA: Diagnosis not present

## 2021-09-19 DIAGNOSIS — M62838 Other muscle spasm: Secondary | ICD-10-CM

## 2021-09-19 DIAGNOSIS — M533 Sacrococcygeal disorders, not elsewhere classified: Secondary | ICD-10-CM

## 2021-09-19 DIAGNOSIS — R2689 Other abnormalities of gait and mobility: Secondary | ICD-10-CM | POA: Diagnosis not present

## 2021-09-19 DIAGNOSIS — M4125 Other idiopathic scoliosis, thoracolumbar region: Secondary | ICD-10-CM | POA: Diagnosis not present

## 2021-09-19 NOTE — Therapy (Addendum)
[place] MAIN [facility] SERVICES [address] [place], [place], [address] Phone: [phone]   Fax:  [phone]  Physical Therapy Treatment / progress note across 10 visits reporting from 05/30/21 to 09/19/21   Patient Details  Name: Barbara Schwartz MRN: [id number] Date of Birth: 01-14-1946 Referring Provider (PT): Fara Olden MD   Encounter Date: 09/19/2021   PT End of Session - 09/19/21 0926     Visit Number 20    Date for PT Re-Evaluation 11/28/21   eval 03/23/21, PN 05/30/21, 09/19/21   PT Start Time 0901    PT Stop Time 1000    PT Time Calculation (min) 59 min    Activity Tolerance Patient tolerated treatment well;No increased pain    Behavior During Therapy [facility] for tasks assessed/performed             Past Medical History:  Diagnosis Date   Atypical nevus 01/05/1997   slight-mod- 3rd & 4th toe web Left foot -WS   BCC (basal cell carcinoma) 03/17/2004   Rigth lower inner leg (CX35Fu)   BCC (basal cell carcinoma) 12/03/2012   left upper back (CX35FU)   BCC (basal cell carcinoma) x 2 04/09/2006   right forearm (MOHS) left scalp (MOHS)   Bowen's disease x 3 08/26/2015   Left calf medial- (CX35FU) , Left calf sup. (CX35FU), Left calf inf. (CX35FU)   bowens 05/05/1996   lower right shin/outer side (CX35FU)   CHF (congestive heart failure) (HCC)    Coronary artery disease    Dyspnea    with exercise   Nodular basal cell carcinoma (BCC) 07/04/2018   left side lower leg (MOHS)   Osteopenia 02/2019   T score -1.9 FRAX 18% / 9.6%   Ovarian cancer ([place]) 1990   Stage 3   SCC (squamous cell carcinoma) 08/17/2003   left ear (CX35FU)   SCC (squamous cell carcinoma) 03/17/2004   right ant. shin (CX35FU)   SCC (squamous cell carcinoma) 09/10/2017   left lower shin   SCC (squamous cell carcinoma) x 2 12/03/2012   left outer calf (CX35FU) right cheek (CX35FU)   SCC (squamous cell carcinoma) x 2 06/29/2014   right cheek, lateral (MOHS)  Right cheek,medial (MOHS)   SCC (squamous cell carcinoma) x 3 07/04/2018   back of crown, inf, Left lower leg (CX35FU), Right lower calf (CX35FU)   Squamous cell carcinoma of skin 11/02/2019   in situ on right lower leg, posterior - CX3+5FU   Squamous cell carcinoma of skin 11/02/2019   in situ on left lower leg, posterior - CX3+5FU   Superficial basal cell carcinoma (BCC) 05/05/1996   left right shin inner side (CX35FU)   Superficial basal cell carcinoma (BCC) 08/24/2014   right submandibular (Dr Aundra Millet bx'd )    Past Surgical History:  Procedure Laterality Date   ABDOMINAL HYSTERECTOMY  1990   TAH BSO   Bowel obstruction     BREAST BIOPSY Right 11/27/2002   CARDIAC CATHETERIZATION  07/11/2017   INTRAVASCULAR PRESSURE WIRE/FFR STUDY N/A 07/11/2017   Procedure: INTRAVASCULAR PRESSURE WIRE/FFR STUDY;  Surgeon: Sherren Mocha, MD;  Location: [place] [facility];  Service: Cardiovascular;  Laterality: N/A;   OOPHORECTOMY     BSO   RIGHT/LEFT HEART CATH AND CORONARY ANGIOGRAPHY N/A 07/11/2017   Procedure: RIGHT/LEFT HEART CATH AND CORONARY ANGIOGRAPHY;  Surgeon: Sherren Mocha, MD;  Location: [place] [facility];  Service: Cardiovascular;  Laterality: N/A;   Squamous cell and Basal cell excision  VIDEO BRONCHOSCOPY Bilateral 02/15/2015   Procedure: VIDEO BRONCHOSCOPY WITH FLUORO;  Surgeon: Juanito Doom, MD;  Location: [facility];  Service: Cardiopulmonary;  Laterality: Bilateral;    There were no vitals filed for this visit.   Subjective Assessment - 09/19/21 1122     Subjective Pt reports no more hip knee pain after last session    Pertinent History Hx of ovarian CA with Tx of hysterectomy, chemotherapy,1990,  Skin squamous/basal cell carcinoma removal at top of head, face, legs,arms, eye lid,   CAD, SOB with exercises, 1 vaginal deliveries, Side effects from chemotherapy: loss of hearing ( wears hearing aids B), neuropathy of feet and hands. Hx of ankle sprains in B, Hx of  falls x 3 in the past 6 months ( 6-7 falls in one year).  Physical activities: Prior to getting COVID 02/2021, pt used to play tennis 3 x week, yoga, spin classes, 3 miles. Long COVID Sx include: dry cough and tiredness. Pt noticed her stamina has not returned with playing tennis by the last set in 90 min periods compared to being able to play for 2 -2.5 hours.    Patient Stated Goals work this pain and be back on the bike                [facility] PT Assessment - 09/19/21 0926       Observation/Other Assessments   Observations stepping with hip abduction caused no R knee pain, knee alignment more 2-3 rd toe present without cues      Single Leg Stance   Comments single UE, SLS B30 sec flat ground,    noted toe gripping      Sit to Stand   Comments collapsed medial arch on Rfoot      Other:   Other/Comments heel raises B 15 reps MMT 4/5      Posture/Postural Control   Posture Comments cervical mm endurance test 2 min 15 sec      AROM   Overall AROM Comments cervical rotation 35 deg B, sideflexion 35 deg L, w/ pain, 40 deg R  seated                           [facility] Adult PT Treatment/Exercise - 09/19/21 0954       Therapeutic Activites    Other Therapeutic Activities reassessed goals, created goal for balance on uneven surfaces to prepare pt to hike at [facility] and jump on rocks      Neuro Re-ed    Neuro Re-ed Details  cued for less toe gripping and modified stretching to minimize pain at the R glut after new HEP                          PT Long Term Goals - 09/19/21 0907       PT LONG TERM GOAL #1   Title Pt will report decreased pain from 4/10 to  < 2/10 after 15 min of sitting on clinic soft chair and being able to sit on hard chairs with 50% less pain inorder to sit incomunity settings    Time 6    Period Weeks    Status Achieved      PT LONG TERM GOAL #2   Title Pt will have no flareups of pain in R buttock after walking on  pavement for 30 min    Time 8    Period Weeks  Status Achieved      PT LONG TERM GOAL #3   Title Pt will be able ride a bike in spin class for 25 min and be compliant stretches and HEP in order to return to biking stationery bike    Time 10    Period Weeks    Status On-going    Target Date 11/28/21      PT LONG TERM GOAL #4   Title Pt will demo no trunk lean and more levelled pelvis and shoulders, and space between rib and iliac crest will be equal in order to minimize pain with sitting and walking and biking  and progress to deep core exercises to maintain alignment    Baseline 3 fingers on R flank, 4 fingers L flank between lowest rib and iliac crest    Time 2    Period Weeks    Status Achieved      PT LONG TERM GOAL #5   Title Pt will improve her FOTO score from 57 pts to  > 67 pts inorder to demonstrate improved functional mobility  ( 06/13/21: 79 pts)    Baseline 57 pts    Time 10    Period Weeks    Status Achieved      PT LONG TERM GOAL #6   Title Pt will demonstrate decreased scar restrictions along abdominal scar in order to progress to more strengthening exercises for trunk stability for decreased risk of falls    Time 5    Period Weeks    Status Achieved      PT LONG TERM GOAL #7   Title Pt will demo increased R hip abduction strength from 2/5 to > 4/5 and no tenderness at ischial tuberosity and glut med in order to improve pelvic stability for balance and mounting a bike. 91/17/23: 4/5)    Time 8    Period Weeks    Status Achieved      PT LONG TERM GOAL #8   Title Pt will increase cervical mobility to look over shoulder when biking in order to minimize falling off bike and maintaining balance while walking    Baseline cervical rotation R 30 deg, L 40 deg,  flexion R 25 deg, L 30 deg,   ( 09/19/21: L 35 with pain , R 40 deg sideflexion,  35 deg roation )    Time 6    Period Weeks    Status Partially Met    Target Date 10/31/21      PT LONG TERM GOAL  #9    TITLE Pt will increase cervical mm endurance test time from  2 min, 48 sec  -> > 3 min in order to minimize neck pain and demo no more forward head posture / throacic kyphosis    Baseline 09/19/21:  2: 15    Time 4    Period Weeks    Status On-going    Target Date 11/14/21      PT LONG TERM GOAL  #10   TITLE Pt will increase SLS on uneven surface without UE support to > 30 sec without toe gripping,  in order to prepare for hiking on rugged terrain on her trip to [facility], jump on rocks    Baseline 22 sec R, 12 on L  on uneven surface without UE support    Time 8    Period Weeks    Status New    Target Date 11/14/21  Plan - 09/19/21 0926     Clinical Impression Statement Pt has achieved  6/10  goals and progressing well towards remaining goals. Pt continues to show no more asymmetry in her pelvic and spinal alignment and significantly less forward head posture. Pt regained equal strength in hip/ LE and had returned to walking 3 miles daily without pain prior to leaving for her trip to [place] last month. Pt was able to go on her trip to [place] but incurred a fall at the beginning of her trip. She had minor knee misalignment upon assessment last week. Manual Tx last week resolved her knee pain from this fall. Initiated closed kinetic chain strengthening with cues for proper knee alignment and feet co-activation to minimize genu valgus/ collapsed arches to improve balance. Pt also learned dynamic balance exercises today and plan to progress her towards biking to prepare for her next trip hiking and biking  [facility] in July.  Pt continues to benefit from skilled PT to improve dynamic balance and strength/ propioception to progress towards goals related to biking/ hiking and to minimize falls given her Hx of multiple falls in the past.     Personal Factors and Comorbidities Comorbidity 3+    Comorbidities Hx of ovarian CA with Tx of hysterectomy,  chemotherapy,1990,  Skin squamous/basal cell carcinoma removal at top of head, face, legs,arms, eye lid, , CAD, SOB with exercises, 1 vaginal deliveries, Side effects from chemotherapy: loss of hearing ( wears hearing aids B), neuropathy of feet and hands. Hx of ankle sprains in B, Hx of falls x 3 in the past 6 months ( 6-7 falls in one year). Physical activities: Prior to getting COVID 02/2021, pt used to play tennis 3 x week, yoga, spin classes, 3 miles. Long COVID Sx include: dry coough and tiredness. Pt noticed her stamina has not returned with playing tennis by the last set in 90 min periods compared to being able to play for 2 -2.5 hours    Examination-Activity Limitations Locomotion Level;Other    Stability/Clinical Decision Making Evolving/Moderate complexity    Rehab Potential Good    PT Frequency 1x / week    PT Duration Other (comment)   10   PT Treatment/Interventions Gait training;Stair training;Neuromuscular re-education;Moist Heat;Traction;Therapeutic activities;Therapeutic exercise;Patient/family education;Manual techniques;Energy conservation;Splinting;Taping;Cryotherapy;Balance training;ADLs/Self Care Home Management;Scar mobilization;Spinal Manipulations;Joint Manipulations;Functional mobility training    Consulted and Agree with Plan of Care Patient             Patient will benefit from skilled therapeutic intervention in order to improve the following deficits and impairments:  Decreased endurance, Decreased activity tolerance, Decreased balance, Difficulty walking, Impaired flexibility, Impaired vision/preception, Decreased strength, Decreased mobility, Decreased coordination, Decreased scar mobility, Abnormal gait, Improper body mechanics, Pain, Increased muscle spasms, Postural dysfunction, Impaired perceived functional ability, Hypomobility, Decreased range of motion, Increased fascial restricitons  Visit Diagnosis:     Other abnormalities of gait and  mobility  Sacrococcygeal disorders, not elsewhere classified   Abnormal posture  Other muscle spasm     Problem List Patient Active Problem List   Diagnosis Date Noted   Hyperlipidemia 08/06/2018   Coronary artery disease with exertional angina ([place]) 07/11/2017   CAD (coronary artery disease) 07/11/2017   Dyspnea 05/01/2016   Solitary pulmonary nodule 05/02/2015   Adverse effects of medication 02/14/2015   Cough 01/28/2015   Bronchiectasis without acute exacerbation ([place]) 01/28/2015   Shadow 01/25/2015   Ovarian ca ([place])    Osteopenia     Jerl Mina, PT 09/19/2021, 11:23  AM  [place] MAIN [facility] SERVICES [address] [place], [place], [address] Phone: [phone]   Fax:  [phone]  Name: Barbara Schwartz MRN: [id number] Date of Birth: 11/05/1945

## 2021-09-19 NOTE — Patient Instructions (Signed)
Balance on pillow for 30 sec with finger on table  [place] pressure 50% and heel 50%  Toes spread and up,  Navel over ballmounds, knees slightly bent   Stare AHEAD   2 x day   ___  Closed kinetic chain hip abduction / glut strengthening to compliment biking and to bring awareness of knee alignment and toes spread/ arch lifted  and cardio   1) figure 4 stretch on couch/ bed at the degree before pain  2) Stepping side to  side  [place]mounds pressure 50% and heel 50%  Pull red band elbows by side As you step out   2 min    Followed by the figure 4 stretch on couch/ bed at the degree before pain   2 sets with stretch included   ___  Sit to stand  Wide knees,  Heels back, More weight on the pinky toe side of foot , tracking knee along2-3rd toes  10 reps   Followed by stretches

## 2021-09-19 NOTE — Addendum Note (Signed)
Addended by: Jerl Mina on: 09/19/2021 02:13 PM   Modules accepted: Orders

## 2021-09-26 ENCOUNTER — Other Ambulatory Visit: Payer: Self-pay

## 2021-09-26 ENCOUNTER — Ambulatory Visit: Payer: Medicare Other | Attending: Orthopaedic Surgery | Admitting: Physical Therapy

## 2021-09-26 DIAGNOSIS — R2689 Other abnormalities of gait and mobility: Secondary | ICD-10-CM | POA: Diagnosis not present

## 2021-09-26 DIAGNOSIS — M533 Sacrococcygeal disorders, not elsewhere classified: Secondary | ICD-10-CM | POA: Insufficient documentation

## 2021-09-26 DIAGNOSIS — R293 Abnormal posture: Secondary | ICD-10-CM | POA: Insufficient documentation

## 2021-09-26 DIAGNOSIS — M62838 Other muscle spasm: Secondary | ICD-10-CM | POA: Insufficient documentation

## 2021-09-26 NOTE — Therapy (Signed)
[place] [facility] SERVICES [address] [place], [place], [address] Phone: [phone]   Fax:  [phone]  Physical Therapy Treatment  Patient Details  Name: Barbara Schwartz MRN: [id number] Date of Birth: October 15, 1945 Referring Provider (PT): Melrose Nakayama, MD   Encounter Date: 09/26/2021   PT End of Session - 09/26/21 0902     Visit Number 21    Date for PT Re-Evaluation 11/28/21   eval 03/23/21, PN 05/30/21, 09/19/21   PT Start Time 2458    PT Stop Time 1000    PT Time Calculation (min) 58 min    Activity Tolerance Patient tolerated treatment well;No increased pain    Behavior During Therapy [facility] for tasks assessed/performed             Past Medical History:  Diagnosis Date   Atypical nevus 01/05/1997   slight-mod- 3rd & 4th toe web Left foot -WS   BCC (basal cell carcinoma) 03/17/2004   Rigth lower inner leg (CX35Fu)   BCC (basal cell carcinoma) 12/03/2012   left upper back (CX35FU)   BCC (basal cell carcinoma) x 2 04/09/2006   right forearm (MOHS) left scalp (MOHS)   Bowen's disease x 3 08/26/2015   Left calf medial- (CX35FU) , Left calf sup. (CX35FU), Left calf inf. (CX35FU)   bowens 05/05/1996   lower right shin/outer side (CX35FU)   CHF (congestive heart failure) (HCC)    Coronary artery disease    Dyspnea    with exercise   Nodular basal cell carcinoma (BCC) 07/04/2018   left side lower leg (MOHS)   Osteopenia 02/2019   T score -1.9 FRAX 18% / 9.6%   Ovarian cancer ([place]) 1990   Stage 3   SCC (squamous cell carcinoma) 08/17/2003   left ear (CX35FU)   SCC (squamous cell carcinoma) 03/17/2004   right ant. shin (CX35FU)   SCC (squamous cell carcinoma) 09/10/2017   left lower shin   SCC (squamous cell carcinoma) x 2 12/03/2012   left outer calf (CX35FU) right cheek (CX35FU)   SCC (squamous cell carcinoma) x 2 06/29/2014   right cheek, lateral (MOHS) Right cheek,medial (MOHS)   SCC (squamous cell carcinoma) x 3  07/04/2018   back of crown, inf, Left lower leg (CX35FU), Right lower calf (CX35FU)   Squamous cell carcinoma of skin 11/02/2019   in situ on right lower leg, posterior - CX3+5FU   Squamous cell carcinoma of skin 11/02/2019   in situ on left lower leg, posterior - CX3+5FU   Superficial basal cell carcinoma (BCC) 05/05/1996   left right shin inner side (CX35FU)   Superficial basal cell carcinoma (BCC) 08/24/2014   right submandibular (Dr Aundra Millet bx'd )    Past Surgical History:  Procedure Laterality Date   ABDOMINAL HYSTERECTOMY  1990   TAH BSO   Bowel obstruction     BREAST BIOPSY Right 11/27/2002   CARDIAC CATHETERIZATION  07/11/2017   INTRAVASCULAR PRESSURE WIRE/FFR STUDY N/A 07/11/2017   Procedure: INTRAVASCULAR PRESSURE WIRE/FFR STUDY;  Surgeon: Sherren Mocha, MD;  Location: [place] CV LAB;  Service: Cardiovascular;  Laterality: N/A;   OOPHORECTOMY     BSO   RIGHT/LEFT HEART CATH AND CORONARY ANGIOGRAPHY N/A 07/11/2017   Procedure: RIGHT/LEFT HEART CATH AND CORONARY ANGIOGRAPHY;  Surgeon: Sherren Mocha, MD;  Location: [place] CV LAB;  Service: Cardiovascular;  Laterality: N/A;   Squamous cell and Basal cell excision     VIDEO BRONCHOSCOPY Bilateral 02/15/2015   Procedure: VIDEO BRONCHOSCOPY WITH  FLUORO;  Surgeon: Juanito Doom, MD;  Location: [facility] ENDOSCOPY;  Service: Cardiopulmonary;  Laterality: Bilateral;    There were no vitals filed for this visit.   Subjective Assessment - 09/26/21 0907     Subjective Pt had no difficulty with new HEP with balance    Pertinent History Hx of ovarian CA with Tx of hysterectomy, chemotherapy,1990,  Skin squamous/basal cell carcinoma removal at top of head, face, legs,arms, eye lid,   CAD, SOB with exercises, 1 vaginal deliveries, Side effects from chemotherapy: loss of hearing ( wears hearing aids B), neuropathy of feet and hands. Hx of ankle sprains in B, Hx of falls x 3 in the past 6 months ( 6-7 falls in one year).  Physical  activities: Prior to getting COVID 02/2021, pt used to play tennis 3 x week, yoga, spin classes, 3 miles. Long COVID Sx include: dry cough and tiredness. Pt noticed her stamina has not returned with playing tennis by the last set in 90 min periods compared to being able to play for 2 -2.5 hours.    Patient Stated Goals work this pain and be back on the bike                [facility] PT Assessment - 09/26/21 1052       Observation/Other Assessments   Observations sometimes knee adducted in lunge position and in cycling, mounting stationary bike with foot in pedal first, slight LOB with self corrections with stretches      Other:   Other/Comments biking 15 min  level 1-3, 87 bpm heart rate      Palpation   Palpation comment minor tightness at R glut, ischial rami attachments after biking and yoga stretches                           [facility] Adult PT Treatment/Exercise - 09/26/21 1054       Therapeutic Activites    Other Therapeutic Activities Explained target heart rate measurements ,explained safe mounting and dismounting from bike to minimize injuries,  gradual biking and emphasized stretching of global mm and stretching quads      Neuro Re-ed    Neuro Re-ed Details  cued for knee alignment when cycling and in yoga positions, modified SLS yoga poses for balance      Manual Therapy   Manual therapy comments STM/MWM at R SIJ/ glut area to minimzie tightness                          PT Long Term Goals - 09/26/21 1058       PT LONG TERM GOAL #1   Title Pt will report decreased pain from 4/10 to  < 2/10 after 15 min of sitting on clinic soft chair and being able to sit on hard chairs with 50% less pain inorder to sit incomunity settings    Time 6    Period Weeks    Status Achieved      PT LONG TERM GOAL #2   Title Pt will have no flareups of pain in R buttock after walking on pavement for 30 min    Time 8    Period Weeks    Status Achieved       PT LONG TERM GOAL #3   Title Pt will be able ride a bike in spin class for 25 min and be compliant stretches and HEP in order to  return to biking stationery bike    Time 10    Period Weeks    Status On-going    Target Date 11/28/21      PT LONG TERM GOAL #4   Title Pt will demo no trunk lean and more levelled pelvis and shoulders, and space between rib and iliac crest will be equal in order to minimize pain with sitting and walking and biking  and progress to deep core exercises to maintain alignment    Baseline 3 fingers on R flank, 4 fingers L flank between lowest rib and iliac crest    Time 2    Period Weeks    Status Achieved      PT LONG TERM GOAL #5   Title Pt will improve her FOTO score from 57 pts to  > 67 pts inorder to demonstrate improved functional mobility  ( 06/13/21: 79 pts)    Baseline 57 pts    Time 10    Period Weeks    Status Achieved      PT LONG TERM GOAL #6   Title Pt will demonstrate decreased scar restrictions along abdominal scar in order to progress to more strengthening exercises for trunk stability for decreased risk of falls    Time 5    Period Weeks    Status Achieved      PT LONG TERM GOAL #7   Title Pt will demo increased R hip abduction strength from 2/5 to > 4/5 and no tenderness at ischial tuberosity and glut med in order to improve pelvic stability for balance and mounting a bike. 91/17/23: 4/5)    Time 8    Period Weeks    Status Achieved      PT LONG TERM GOAL #8   Title Pt will increase cervical mobility to look over shoulder when biking in order to minimize falling off bike and maintaining balance while walking    Baseline cervical rotation R 30 deg, L 40 deg,  flexion R 25 deg, L 30 deg,   ( 09/19/21: L 35 with pain , R 40 deg sideflexion,  35 deg roation )    Time 6    Period Weeks    Status Partially Met    Target Date 10/31/21      PT LONG TERM GOAL  #9   TITLE Pt will increase cervical mm endurance test time from  2 min, 48 sec  ->  > 3 min in order to minimize neck pain and demo no more forward head posture / throacic kyphosis    Baseline 09/19/21:  2: 15    Time 4    Period Weeks    Status On-going    Target Date 11/14/21      PT LONG TERM GOAL  #10   TITLE Pt will increase SLS on uneven surface without UE support to > 30 sec without toe gripping,  in order to prepare for hiking on rugged terrain on her trip to [facility], jump on rocks    Baseline 22 sec R, 12 on L  on uneven surface without UE support    Time 8    Period Weeks    Status New    Target Date 11/14/21                   Plan - 09/26/21 1057     Clinical Impression Statement Pt progressed to stationery biking today for 15 min with cues for knee alignment. Required cues  for safe mounting and dismounting from bike to minimize injuries. Emphasized stretching of global mm and stretching quads. Pt showed slight tightness, tenderness at R glut area after yoga stretches and biking. Pt required cues for balance and stability in standing yoga postures. Plan to continue with balance training and gradual biking preparations. Pt continues to benefit from skilled PT   Personal Factors and Comorbidities Comorbidity 3+    Comorbidities Hx of ovarian CA with Tx of hysterectomy, chemotherapy,1990,  Skin squamous/basal cell carcinoma removal at top of head, face, legs,arms, eye lid, , CAD, SOB with exercises, 1 vaginal deliveries, Side effects from chemotherapy: loss of hearing ( wears hearing aids B), neuropathy of feet and hands. Hx of ankle sprains in B, Hx of falls x 3 in the past 6 months ( 6-7 falls in one year). Physical activities: Prior to getting COVID 02/2021, pt used to play tennis 3 x week, yoga, spin classes, 3 miles. Long COVID Sx include: dry coough and tiredness. Pt noticed her stamina has not returned with playing tennis by the last set in 90 min periods compared to being able to play for 2 -2.5 hours    Examination-Activity Limitations  Locomotion Level;Other    Stability/Clinical Decision Making Evolving/Moderate complexity    Rehab Potential Good    PT Frequency 1x / week    PT Duration Other (comment)   10   PT Treatment/Interventions Gait training;Stair training;Neuromuscular re-education;Moist Heat;Traction;Therapeutic activities;Therapeutic exercise;Patient/family education;Manual techniques;Energy conservation;Splinting;Taping;Cryotherapy;Balance training;ADLs/Self Care Home Management;Scar mobilization;Spinal Manipulations;Joint Manipulations;Functional mobility training    Consulted and Agree with Plan of Care Patient             Patient will benefit from skilled therapeutic intervention in order to improve the following deficits and impairments:  Decreased endurance, Decreased activity tolerance, Decreased balance, Difficulty walking, Impaired flexibility, Impaired vision/preception, Decreased strength, Decreased mobility, Decreased coordination, Decreased scar mobility, Abnormal gait, Improper body mechanics, Pain, Increased muscle spasms, Postural dysfunction, Impaired perceived functional ability, Hypomobility, Decreased range of motion, Increased fascial restricitons  Visit Diagnosis: Sacrococcygeal disorders, not elsewhere classified  Other abnormalities of gait and mobility  Abnormal posture  Other muscle spasm     Problem List Patient Active Problem List   Diagnosis Date Noted   Hyperlipidemia 08/06/2018   Coronary artery disease with exertional angina ([place]) 07/11/2017   CAD (coronary artery disease) 07/11/2017   Dyspnea 05/01/2016   Solitary pulmonary nodule 05/02/2015   Adverse effects of medication 02/14/2015   Cough 01/28/2015   Bronchiectasis without acute exacerbation ([place]) 01/28/2015   Shadow 01/25/2015   Ovarian ca ([place])    Osteopenia     Jerl Mina, PT 09/26/2021, 10:59 AM  [place],  [place], [address] Phone: [phone]   Fax:  [phone]  Name: Barbara Schwartz MRN: [id number] Date of Birth: [birth year]

## 2021-09-26 NOTE — Patient Instructions (Signed)
Reviewed post biking stretches ? ?Biking 15 min with warm and cool down with low resistances ? Explained target heart rate measurements  ? ?

## 2021-09-28 ENCOUNTER — Ambulatory Visit (INDEPENDENT_AMBULATORY_CARE_PROVIDER_SITE_OTHER): Payer: Medicare Other | Admitting: Dermatology

## 2021-09-28 ENCOUNTER — Encounter: Payer: Self-pay | Admitting: Dermatology

## 2021-09-28 ENCOUNTER — Other Ambulatory Visit: Payer: Self-pay

## 2021-09-28 DIAGNOSIS — D044 Carcinoma in situ of skin of scalp and neck: Secondary | ICD-10-CM | POA: Diagnosis not present

## 2021-09-28 DIAGNOSIS — L308 Other specified dermatitis: Secondary | ICD-10-CM | POA: Diagnosis not present

## 2021-09-28 DIAGNOSIS — L570 Actinic keratosis: Secondary | ICD-10-CM | POA: Diagnosis not present

## 2021-09-28 DIAGNOSIS — D0439 Carcinoma in situ of skin of other parts of face: Secondary | ICD-10-CM | POA: Diagnosis not present

## 2021-09-28 DIAGNOSIS — D485 Neoplasm of uncertain behavior of skin: Secondary | ICD-10-CM

## 2021-09-28 DIAGNOSIS — L309 Dermatitis, unspecified: Secondary | ICD-10-CM

## 2021-09-28 DIAGNOSIS — D099 Carcinoma in situ, unspecified: Secondary | ICD-10-CM

## 2021-09-28 MED ORDER — CLOBETASOL PROPIONATE 0.05 % EX OINT: 1.0000 "application " | TOPICAL_OINTMENT | Freq: Two times a day (BID) | CUTANEOUS | 0 refills | Status: DC

## 2021-09-28 MED ORDER — IMIQUIMOD 5 % EX CREA: TOPICAL_CREAM | Freq: Every day | CUTANEOUS | 0 refills | Status: AC

## 2021-09-28 NOTE — Patient Instructions (Signed)

## 2021-10-08 ENCOUNTER — Encounter: Payer: Self-pay | Admitting: Dermatology

## 2021-10-09 NOTE — Progress Notes (Signed)
? ?Follow-Up Visit ?  ?Subjective  ?Barbara Schwartz is a 76 y.o. female who presents for the following: Procedure (SCCIS x1 R Submandibular Area ). ? ?Biopsy-proven CIS right jawline, enlarging crust right temple, recurring rash on buttocks, other crusts on face. ?Location:  ?Duration:  ?Quality:  ?Associated Signs/Symptoms: ?Modifying Factors:  ?Severity:  ?Timing: ?Context:  ? ?Objective  ?Well appearing patient in no apparent distress; mood and affect are within normal limits. ?Right Submandibular Area ?Lesion identified by Dr.Zerline Schwartz and nurse in room.   ? ?Right Hip (side) - Posterior ?Pink waxy 6 mm crust, diagnosis uncertain. ? ? ? ? ?Left Sideburn, Right Lower Ear Junction ?Nonhypertrophic gritty pink crusts.  Patient is comfortable with trying Aldara when time is convenient.  This will be applied Monday Wednesday Friday for 6 to 8 weeks or until there is local brisk inflammation. ? ?Right Hip (side) - Posterior ?Ongoing uncertainty about cause of recurring circular and Arkell form pink swollen 1 cm patches on buttocks.  She can use clobetasol locally but we will also obtain a sample which she understands may not be helpful in establishing a definitive diagnosis. ? ?Right Temporal Scalp ?Exophytic 4 mm white horn on top of waxy pink base ? ? ? ? ? ? ? ? ?A focused examination was performed including head, neck, lower torso, buttocks, hip, arms, legs.. Relevant physical exam findings are noted in the Assessment and Plan. ? ? ?Assessment & Plan  ? ? ?Squamous cell carcinoma in situ ?Right Submandibular Area ? ?Destruction of lesion ?Complexity: simple   ?Destruction method: electrodesiccation and curettage   ?Informed consent: discussed and consent obtained   ?Timeout:  patient name, date of birth, surgical site, and procedure verified ?Anesthesia: the lesion was anesthetized in a standard fashion   ?Anesthetic:  1% lidocaine w/ epinephrine 1-100,000 local infiltration ?Curettage performed in three different  directions: Yes   ?Electrodesiccation performed over the curetted area: Yes   ?Curettage cycles:  3 ?Lesion length (cm):  0.9 ?Lesion width (cm):  0.9 ?Margin per side (cm):  0 ?Final wound size (cm):  0.9 ?Hemostasis achieved with:  ferric subsulfate ?Outcome: patient tolerated procedure well with no complications   ?Post-procedure details: sterile dressing applied and wound care instructions given   ?Dressing type: bandage and petrolatum   ?Additional details:  Wound inoculated with 5% Fluorouracil.   ? ?Neoplasm of uncertain behavior of skin ?Right Hip (side) - Posterior ? ?Skin / nail biopsy ?Type of biopsy: tangential   ?Informed consent: discussed and consent obtained   ?Timeout: patient name, date of birth, surgical site, and procedure verified   ?Anesthesia: the lesion was anesthetized in a standard fashion   ?Anesthetic:  1% lidocaine w/ epinephrine 1-100,000 local infiltration ?Instrument used: flexible razor blade   ?Hemostasis achieved with: ferric subsulfate and electrodesiccation   ?Outcome: patient tolerated procedure well   ?Post-procedure details: wound care instructions given   ? ?Specimen 1 - Surgical pathology ?Differential Diagnosis: R/O DERMATITIS ? ?Check Margins: No ? ?Actinic keratosis (2) ?Left Sideburn; Right Lower Ear Junction ? ?AK VS CIS ? ?Barbara Schwartz ? ?Related Medications ?imiquimod (ALDARA) 5 % cream ?Apply topically at bedtime. ? ?Dermatitis ?Right Hip (side) - Posterior ? ?Topical clobetasol daily at first sign of recurrence. ? ?Related Medications ?clobetasol ointment (TEMOVATE) 0.05 % ?Apply 1 application. topically 2 (two) times daily. ? ?Squamous cell carcinoma in situ (SCCIS) of skin of right temple region ?Right Temporal Scalp ? ?After shave biopsy the base was  treated with curettage and cautery followed by inoculation with fluorouracil. ? ?Skin / nail biopsy - Right Temporal Scalp ?Type of biopsy: tangential   ?Informed consent: discussed and consent obtained   ?Timeout:  patient name, date of birth, surgical site, and procedure verified   ?Anesthesia: the lesion was anesthetized in a standard fashion   ?Anesthetic:  1% lidocaine w/ epinephrine 1-100,000 local infiltration ?Instrument used: flexible razor blade   ?Hemostasis achieved with: ferric subsulfate and electrodesiccation   ?Outcome: patient tolerated procedure well   ?Post-procedure details: wound care instructions given   ? ?Destruction of lesion - Right Temporal Scalp ?Complexity: simple   ?Destruction method: electrodesiccation and curettage   ?Informed consent: discussed and consent obtained   ?Timeout:  patient name, date of birth, surgical site, and procedure verified ?Anesthesia: the lesion was anesthetized in a standard fashion   ?Anesthetic:  1% lidocaine w/ epinephrine 1-100,000 local infiltration ?Curettage performed in three different directions: Yes   ?Electrodesiccation performed over the curetted area: Yes   ?Curettage cycles:  3 ?Lesion length (cm):  1 ?Lesion width (cm):  1 ?Margin per side (cm):  0 ?Final wound size (cm):  1 ?Hemostasis achieved with:  ferric subsulfate ?Outcome: patient tolerated procedure well with no complications   ?Post-procedure details: sterile dressing applied and wound care instructions given   ?Dressing type: bandage and petrolatum   ?Additional details:  Wound inoculated with 5% Fluorouracil.   ? ?Specimen 2 - Surgical pathology ?Differential Diagnosis: R/O BCC VS SCC - TXPBX  ? ?Check Margins: No ? ? ? ? ? ?I, Barbara Monarch, MD, have reviewed all documentation for this visit.  The documentation on 10/09/21 for the exam, diagnosis, procedures, and orders are all accurate and complete. ?

## 2021-10-10 ENCOUNTER — Other Ambulatory Visit: Payer: Self-pay

## 2021-10-10 ENCOUNTER — Ambulatory Visit: Payer: Medicare Other | Admitting: Physical Therapy

## 2021-10-10 DIAGNOSIS — M533 Sacrococcygeal disorders, not elsewhere classified: Secondary | ICD-10-CM

## 2021-10-10 DIAGNOSIS — R293 Abnormal posture: Secondary | ICD-10-CM

## 2021-10-10 DIAGNOSIS — M62838 Other muscle spasm: Secondary | ICD-10-CM

## 2021-10-10 DIAGNOSIS — R2689 Other abnormalities of gait and mobility: Secondary | ICD-10-CM | POA: Diagnosis not present

## 2021-10-10 NOTE — Patient Instructions (Addendum)
10/10/21 ?STRETCHES NECK, Midback AND BACK : ? ?Kitchen counter stretches ?Neck stretches ?Stand perpendicular to wall, tilt head to wall,tuck chin, swivel head to look at map ?Shoulder stretches- elbow circles - angel wings ? ?Pat on back ?Open book - rbs,heart,head ?Doorway Corporate investment banker for shoulder blades ?Side lean palm up ? ?STRETCHES for lower body: ? ?Woman warrior 1 & 2 ?Hip stretch on couch ?Calf stretch  ?Thigh stretch  ? ?STRENGTHENING POSTURE ? ?Mini cobra ? ?Modified swimmer ? ?Modified bird dog   - no arms, red bands at foot, pull with red bands, toes tucked  10 reps  ? ?Locust pose (superwoman)   - blue band behind back , pulling 15 deg from side pockets   -10 reps  ? ? ? ?BALANCE  ? ?Heel raises ? ?Stair step ? ?Step back ski track -2 min ? ?Letter T seesaw  ? ?Tap foot ? ?Balance on pillow 30 seconds ? ?Side step -2 min ? ?Clam shell ? ? ?OMIT    ?W exercise with band ?3 foot tap ?Low cobra  ?_______________________________ ? ?Stretches before / after biking  AND EVERYDAY  : (Cuing provided for proper alignment)  ?Instructions start with Strap on R  ? ? ?Stretches for your legs: ?LAYING on Back ?Use upper arms and elbows for stability when pulling strap ?Opposite knee bent and foot firm in align with hip  ? ?Strap on ballmound: ? ?Hip socket  ?_strap, L knee bent, R ballmound against strap and spread toes, ?rolling foot 15 deg out and in across midline.  10 reps each side  ? ?Hamstring ?_knee bends ? 10 reps  With knee pointing straight ( slightly to outside to minimize snapping sensation)   ? ?  10 reps with knee pointing out towards armpit ( notice the stretch in the medial hamstring muscle)  ? ? ? ?IT band  ?_scoot hips to R, cross R leg over L and straighten knee with strap on ballmound,  ? ?Bend knee back and forth 5x ? ? ? ?Quad in sidelying ?_strap around the ankle, pulling ankle towards buttocks  ?Bottom leg firm and stabilization with knee bent  ?Adductors and pelvic floor ( Happy Baby)  : ?Delane Ginger are wide towards armpits, sole of feet towards ceiling  ? ?On your back again: ? ?Strap under R thigh ?Hip abductors ( figure 4)   ?   ,L ankle over R thigh ( stretching L glut)  ?   5  Breaths  ? ?

## 2021-10-10 NOTE — Therapy (Signed)
[place] [facility] ?[place], [place], [address] ?Phone: [phone]   Fax:  [phone] ? ?Physical Therapy Treatment ? ?Patient Details  ?Name: Barbara Schwartz ?MRN: [id number] ?Date of Birth: February 21, 1946 ?Referring Provider (PT): Melrose Nakayama, MD ? ? ?Encounter Date: 10/10/2021 ? ? PT End of Session - 10/10/21 1145   ? ? Visit Number 22   ? Date for PT Re-Evaluation 11/28/21   eval 03/23/21, PN 05/30/21, 09/19/21  ? PT Start Time [phone]   ? PT Stop Time 1000   ? PT Time Calculation (min) 63 min   ? Activity Tolerance Patient tolerated treatment well;No increased pain   ? Behavior During Therapy [facility] for tasks assessed/performed   ? ?  ?  ? ?  ? ? ?Past Medical History:  ?Diagnosis Date  ? Atypical nevus 01/05/1997  ? slight-mod- 3rd & 4th toe web Left foot -WS  ? BCC (basal cell carcinoma) 03/17/2004  ? Rigth lower inner leg (CX35Fu)  ? BCC (basal cell carcinoma) 12/03/2012  ? left upper back (CX35FU)  ? BCC (basal cell carcinoma) x 2 04/09/2006  ? right forearm (MOHS) left scalp (MOHS)  ? Bowen's disease x 3 08/26/2015  ? Left calf medial- (CX35FU) , Left calf sup. (CX35FU), Left calf inf. (CX35FU)  ? bowens 05/05/1996  ? lower right shin/outer side (CX35FU)  ? CHF (congestive heart failure) ([place])   ? Coronary artery disease   ? Dyspnea   ? with exercise  ? Nodular basal cell carcinoma (BCC) 07/04/2018  ? left side lower leg (MOHS)  ? Osteopenia 02/2019  ? T score -1.9 FRAX 18% / 9.6%  ? Ovarian cancer ([place]) 1990  ? Stage 3  ? SCC (squamous cell carcinoma) 08/17/2003  ? left ear (CX35FU)  ? SCC (squamous cell carcinoma) 03/17/2004  ? right ant. shin (CX35FU)  ? SCC (squamous cell carcinoma) 09/10/2017  ? left lower shin  ? SCC (squamous cell carcinoma) x 2 12/03/2012  ? left outer calf (CX35FU) right cheek (CX35FU)  ? SCC (squamous cell carcinoma) x 2 06/29/2014  ? right cheek, lateral (MOHS) Right cheek,medial (MOHS)  ? SCC (squamous cell carcinoma) x 3  07/04/2018  ? back of crown, inf, Left lower leg (CX35FU), Right lower calf (CX35FU)  ? Squamous cell carcinoma in situ 05/03/2021  ? R Submandibular Are (CX35FU)  ? Squamous cell carcinoma of skin 11/02/2019  ? in situ on right lower leg, posterior - CX3+5FU  ? Squamous cell carcinoma of skin 11/02/2019  ? in situ on left lower leg, posterior - CX3+5FU  ? Superficial basal cell carcinoma (BCC) 05/05/1996  ? left right shin inner side (CX35FU)  ? Superficial basal cell carcinoma (BCC) 08/24/2014  ? right submandibular (Dr Aundra Millet bx'd )  ? ? ?Past Surgical History:  ?Procedure Laterality Date  ? ABDOMINAL HYSTERECTOMY  1990  ? TAH BSO  ? Bowel obstruction    ? BREAST BIOPSY Right 11/27/2002  ? CARDIAC CATHETERIZATION  07/11/2017  ? INTRAVASCULAR PRESSURE WIRE/FFR STUDY N/A 07/11/2017  ? Procedure: INTRAVASCULAR PRESSURE WIRE/FFR STUDY;  Surgeon: Sherren Mocha, MD;  Location: [facility];  Service: Cardiovascular;  Laterality: N/A;  ? OOPHORECTOMY    ? BSO  ? RIGHT/LEFT HEART CATH AND CORONARY ANGIOGRAPHY N/A 07/11/2017  ? Procedure: RIGHT/LEFT HEART CATH AND CORONARY ANGIOGRAPHY;  Surgeon: Sherren Mocha, MD;  Location: [place] [facility];  Service: Cardiovascular;  Laterality: N/A;  ? Squamous cell and Basal cell excision    ?  VIDEO BRONCHOSCOPY Bilateral 02/15/2015  ? Procedure: VIDEO BRONCHOSCOPY WITH FLUORO;  Surgeon: Juanito Doom, MD;  Location: [facility];  Service: Cardiopulmonary;  Laterality: Bilateral;  ? ? ?There were no vitals filed for this visit. ? ? Subjective Assessment - 10/10/21 0900   ? ? Subjective Pt reported she started biking with no resistance, 20 min, every  other day across the past week and had no L glut pain.   ? Pertinent History Hx of ovarian CA with Tx of hysterectomy, chemotherapy,1990,  Skin squamous/basal cell carcinoma removal at top of head, face, legs,arms, eye lid,   CAD, SOB with exercises, 1 vaginal deliveries, Side effects from chemotherapy: loss of hearing (  wears hearing aids B), neuropathy of feet and hands. Hx of ankle sprains in B, Hx of falls x 3 in the past 6 months ( 6-7 falls in one year).  Physical activities: Prior to getting COVID 02/2021, pt used to play tennis 3 x week, yoga, spin classes, 3 miles. Long COVID Sx include: dry cough and tiredness. Pt noticed her stamina has not returned with playing tennis by the last set in 90 min periods compared to being able to play for 2 -2.5 hours.   ? Patient Stated Goals work this pain and be back on the bike   ? ?  ?  ? ?  ? ? ? ? ? [facility] PT Assessment - 10/10/21 1146   ? ?  ? Observation/Other Assessments  ? Observations upright posture   ?  ? Flexibility  ? Hamstrings limited flexibility B   ? Quadriceps slightly limited in hip ext L > R   ? ?  ?  ? ?  ? ? ? ? ? ? ? ? ? ? ? ? ? ? ? ? [facility] Adult PT Treatment/Exercise - 10/10/21 1147   ? ?  ? Therapeutic Activites   ? Other Therapeutic Activities omitted 3 exercises, added more stretches to compliment her return to rstationary biking   ?  ? Neuro Re-ed   ? Neuro Re-ed Details  cued for leg stretches with strap ( new HEP) , cued for resistance band progression to modified bird dog and locust  ( see pt istructions)   ? ?  ?  ? ?  ? ? ? ? ? ? ? ? ? ? ? ? ? ? ? PT Long Term Goals - 09/26/21 1058   ? ?  ? PT LONG TERM GOAL #1  ? Title Pt will report decreased pain from 4/10 to  < 2/10 after 15 min of sitting on clinic soft chair and being able to sit on hard chairs with 50% less pain inorder to sit incomunity settings   ? Time 6   ? Period Weeks   ? Status Achieved   ?  ? PT LONG TERM GOAL #2  ? Title Pt will have no flareups of pain in R buttock after walking on pavement for 30 min   ? Time 8   ? Period Weeks   ? Status Achieved   ?  ? PT LONG TERM GOAL #3  ? Title Pt will be able ride a bike in spin class for 25 min and be compliant stretches and HEP in order to return to biking stationery bike   ? Time 10   ? Period Weeks   ? Status On-going   ? Target Date 11/28/21   ?   ? PT LONG TERM GOAL #4  ? Title Pt  will demo no trunk lean and more levelled pelvis and shoulders, and space between rib and iliac crest will be equal in order to minimize pain with sitting and walking and biking  and progress to deep core exercises to maintain alignment   ? Baseline 3 fingers on R flank, 4 fingers L flank between lowest rib and iliac crest   ? Time 2   ? Period Weeks   ? Status Achieved   ?  ? PT LONG TERM GOAL #5  ? Title Pt will improve her FOTO score from 57 pts to  > 67 pts inorder to demonstrate improved functional mobility  ( 06/13/21: 79 pts)   ? Baseline 57 pts   ? Time 10   ? Period Weeks   ? Status Achieved   ?  ? PT LONG TERM GOAL #6  ? Title Pt will demonstrate decreased scar restrictions along abdominal scar in order to progress to more strengthening exercises for trunk stability for decreased risk of falls   ? Time 5   ? Period Weeks   ? Status Achieved   ?  ? PT LONG TERM GOAL #7  ? Title Pt will demo increased R hip abduction strength from 2/5 to > 4/5 and no tenderness at ischial tuberosity and glut med in order to improve pelvic stability for balance and mounting a bike. 91/17/23: 4/5)   ? Time 8   ? Period Weeks   ? Status Achieved   ?  ? PT LONG TERM GOAL #8  ? Title Pt will increase cervical mobility to look over shoulder when biking in order to minimize falling off bike and maintaining balance while walking   ? Baseline cervical rotation R 30 deg, L 40 deg,  flexion R 25 deg, L 30 deg,   ( 09/19/21: L 35 with pain , R 40 deg sideflexion,  35 deg roation )   ? Time 6   ? Period Weeks   ? Status Partially Met   ? Target Date 10/31/21   ?  ? PT LONG TERM GOAL  #9  ? TITLE Pt will increase cervical mm endurance test time from  2 min, 48 sec  -> > 3 min in order to minimize neck pain and demo no more forward head posture / throacic kyphosis   ? Baseline 09/19/21:  2: 15   ? Time 4   ? Period Weeks   ? Status On-going   ? Target Date 11/14/21   ?  ? PT LONG TERM GOAL  #10  ? TITLE Pt  will increase SLS on uneven surface without UE support to > 30 sec without toe gripping,  in order to prepare for hiking on rugged terrain on her trip to [facility], jump on rocks   ? Baseline 22

## 2021-10-22 ENCOUNTER — Encounter: Payer: Self-pay | Admitting: Dermatology

## 2021-10-24 ENCOUNTER — Encounter: Payer: Medicare Other | Admitting: Physical Therapy

## 2021-10-31 ENCOUNTER — Ambulatory Visit: Payer: Medicare Other | Admitting: Physical Therapy

## 2021-11-14 ENCOUNTER — Ambulatory Visit: Payer: Medicare Other | Attending: Orthopaedic Surgery | Admitting: Physical Therapy

## 2021-11-14 DIAGNOSIS — R2689 Other abnormalities of gait and mobility: Secondary | ICD-10-CM | POA: Insufficient documentation

## 2021-11-14 DIAGNOSIS — R293 Abnormal posture: Secondary | ICD-10-CM | POA: Insufficient documentation

## 2021-11-14 DIAGNOSIS — M62838 Other muscle spasm: Secondary | ICD-10-CM | POA: Insufficient documentation

## 2021-11-14 DIAGNOSIS — M533 Sacrococcygeal disorders, not elsewhere classified: Secondary | ICD-10-CM | POA: Insufficient documentation

## 2021-11-14 NOTE — Therapy (Signed)
[place] [facility] ?[place], [place], [address] ?Phone: [phone]   Fax:  [phone] ? ?Physical Therapy Treatment ? ?Patient Details  ?Name: Barbara Schwartz ?MRN: [id number] ?Date of Birth: 1945-11-03 ?Referring Provider (PT): Melrose Nakayama, MD ? ? ?Encounter Date: 11/14/2021 ? ? PT End of Session - 11/14/21 0913   ? ? Visit Number 23   ? Date for PT Re-Evaluation 11/28/21   ? PT Start Time [phone]   ? PT Stop Time 1000   ? PT Time Calculation (min) 56 min   ? Activity Tolerance Patient tolerated treatment well   ? Behavior During Therapy [facility] for tasks assessed/performed   ? ?  ?  ? ?  ? ? ?Past Medical History:  ?Diagnosis Date  ? Atypical nevus 01/05/1997  ? slight-mod- 3rd & 4th toe web Left foot -WS  ? BCC (basal cell carcinoma) 03/17/2004  ? Rigth lower inner leg (CX35Fu)  ? BCC (basal cell carcinoma) 12/03/2012  ? left upper back (CX35FU)  ? BCC (basal cell carcinoma) x 2 04/09/2006  ? right forearm (MOHS) left scalp (MOHS)  ? Bowen's disease x 3 08/26/2015  ? Left calf medial- (CX35FU) , Left calf sup. (CX35FU), Left calf inf. (CX35FU)  ? bowens 05/05/1996  ? lower right shin/outer side (CX35FU)  ? CHF (congestive heart failure) ([place])   ? Coronary artery disease   ? Dyspnea   ? with exercise  ? Nodular basal cell carcinoma (BCC) 07/04/2018  ? left side lower leg (MOHS)  ? Osteopenia 02/2019  ? T score -1.9 FRAX 18% / 9.6%  ? Ovarian cancer ([place]) 1990  ? Stage 3  ? SCC (squamous cell carcinoma) 08/17/2003  ? left ear (CX35FU)  ? SCC (squamous cell carcinoma) 03/17/2004  ? right ant. shin (CX35FU)  ? SCC (squamous cell carcinoma) 09/10/2017  ? left lower shin  ? SCC (squamous cell carcinoma) x 2 12/03/2012  ? left outer calf (CX35FU) right cheek (CX35FU)  ? SCC (squamous cell carcinoma) x 2 06/29/2014  ? right cheek, lateral (MOHS) Right cheek,medial (MOHS)  ? SCC (squamous cell carcinoma) x 3 07/04/2018  ? back of crown, inf, Left lower leg  (CX35FU), Right lower calf (CX35FU)  ? Squamous cell carcinoma in situ 05/03/2021  ? R Submandibular Are (CX35FU)  ? Squamous cell carcinoma of skin 11/02/2019  ? in situ on right lower leg, posterior - CX3+5FU  ? Squamous cell carcinoma of skin 11/02/2019  ? in situ on left lower leg, posterior - CX3+5FU  ? Superficial basal cell carcinoma (BCC) 05/05/1996  ? left right shin inner side (CX35FU)  ? Superficial basal cell carcinoma (BCC) 08/24/2014  ? right submandibular (Dr Aundra Millet bx'd )  ? ? ?Past Surgical History:  ?Procedure Laterality Date  ? ABDOMINAL HYSTERECTOMY  1990  ? TAH BSO  ? Bowel obstruction    ? BREAST BIOPSY Right 11/27/2002  ? CARDIAC CATHETERIZATION  07/11/2017  ? INTRAVASCULAR PRESSURE WIRE/FFR STUDY N/A 07/11/2017  ? Procedure: INTRAVASCULAR PRESSURE WIRE/FFR STUDY;  Surgeon: Sherren Mocha, MD;  Location: [place];  Service: Cardiovascular;  Laterality: N/A;  ? OOPHORECTOMY    ? BSO  ? RIGHT/LEFT HEART CATH AND CORONARY ANGIOGRAPHY N/A 07/11/2017  ? Procedure: RIGHT/LEFT HEART CATH AND CORONARY ANGIOGRAPHY;  Surgeon: Sherren Mocha, MD;  Location: [place] [facility];  Service: Cardiovascular;  Laterality: N/A;  ? Squamous cell and Basal cell excision    ? VIDEO BRONCHOSCOPY Bilateral 02/15/2015  ?  Procedure: VIDEO BRONCHOSCOPY WITH FLUORO;  Surgeon: Juanito Doom, MD;  Location: [facility] ENDOSCOPY;  Service: Cardiopulmonary;  Laterality: Bilateral;  ? ? ?There were no vitals filed for this visit. ? ? Subjective Assessment - 11/14/21 0908   ? ? Subjective pt reports going to the gym 2x week , riding a regular stationary bike for 45 min and then stretches afterwards. Pt notices the R glut area is tight but no pain. Pt has had no relapse of pain across one month. Pt has not fallen in tennis since starting PT across the past 8 months.   ? Pertinent History Hx of ovarian CA with Tx of hysterectomy, chemotherapy,1990,  Skin squamous/basal cell carcinoma removal at top of head, face,  legs,arms, eye lid,   CAD, SOB with exercises, 1 vaginal deliveries, Side effects from chemotherapy: loss of hearing ( wears hearing aids B), neuropathy of feet and hands. Hx of ankle sprains in B, Hx of falls x 3 in the past 6 months ( 6-7 falls in one year).  Physical activities: Prior to getting COVID 02/2021, pt used to play tennis 3 x week, yoga, spin classes, 3 miles. Long COVID Sx include: dry cough and tiredness. Pt noticed her stamina has not returned with playing tennis by the last set in 90 min periods compared to being able to play for 2 -2.5 hours.   ? Patient Stated Goals work this pain and be back on the bike   ? ?  ?  ? ?  ? ? ? ? ? [facility] PT Assessment - 11/14/21 0914   ? ?  ? Coordination  ? Coordination and Movement Description scaption/ opp hip ext 5/5 B,   recruitment sequence at multifidis, glut, hamstrings B proper   ?  ? Single Leg Stance  ? Comments posterior  COM on feet balancing on uneven disk inside aparellel bars BUE support   ?  ? Strength  ? Overall Strength Comments R hip abd 4-/5, L 4+/5   ? ?  ?  ? ?  ? ? ? ? ? ? ? ? ? ? ? ? ? ? ? ? [facility] Adult PT Treatment/Exercise - 11/14/21 1612   ? ?  ? Therapeutic Activites   ? Other Therapeutic Activities try elliptical that pedals in circulates and 10 min ( 5 min backwards) , explained importance of strengthening R hip abduction and, progressing balance on SLS on pillow to prepare for hikingm plan short trips to mountains local trails for elevation changes   ?  ? Neuro Re-ed   ? Neuro Re-ed Details  cued for more anterior COM on feet balancing on uneven disk inside aparellel bars BUE support,  cued for R clams with green band and scap retraction in prone with band with cue for less lumbar lordosis   ? ?  ?  ? ?  ? ? ? ? ? ? ? ? ? ? ? ? ? ? ? PT Long Term Goals - 09/26/21 1058   ? ?  ? PT LONG TERM GOAL #1  ? Title Pt will report decreased pain from 4/10 to  < 2/10 after 15 min of sitting on clinic soft chair and being able to sit on hard chairs  with 50% less pain inorder to sit incomunity settings   ? Time 6   ? Period Weeks   ? Status Achieved   ?  ? PT LONG TERM GOAL #2  ? Title Pt will have no flareups of pain  in R buttock after walking on pavement for 30 min   ? Time 8   ? Period Weeks   ? Status Achieved   ?  ? PT LONG TERM GOAL #3  ? Title Pt will be able ride a bike in spin class for 25 min and be compliant stretches and HEP in order to return to biking stationery bike   ? Time 10   ? Period Weeks   ? Status On-going   ? Target Date 11/28/21   ?  ? PT LONG TERM GOAL #4  ? Title Pt will demo no trunk lean and more levelled pelvis and shoulders, and space between rib and iliac crest will be equal in order to minimize pain with sitting and walking and biking  and progress to deep core exercises to maintain alignment   ? Baseline 3 fingers on R flank, 4 fingers L flank between lowest rib and iliac crest   ? Time 2   ? Period Weeks   ? Status Achieved   ?  ? PT LONG TERM GOAL #5  ? Title Pt will improve her FOTO score from 57 pts to  > 67 pts inorder to demonstrate improved functional mobility  ( 06/13/21: 79 pts)   ? Baseline 57 pts   ? Time 10   ? Period Weeks   ? Status Achieved   ?  ? PT LONG TERM GOAL #6  ? Title Pt will demonstrate decreased scar restrictions along abdominal scar in order to progress to more strengthening exercises for trunk stability for decreased risk of falls   ? Time 5   ? Period Weeks   ? Status Achieved   ?  ? PT LONG TERM GOAL #7  ? Title Pt will demo increased R hip abduction strength from 2/5 to > 4/5 and no tenderness at ischial tuberosity and glut med in order to improve pelvic stability for balance and mounting a bike. 91/17/23: 4/5)   ? Time 8   ? Period Weeks   ? Status Achieved   ?  ? PT LONG TERM GOAL #8  ? Title Pt will increase cervical mobility to look over shoulder when biking in order to minimize falling off bike and maintaining balance while walking   ? Baseline cervical rotation R 30 deg, L 40 deg,  flexion R  25 deg, L 30 deg,   ( 09/19/21: L 35 with pain , R 40 deg sideflexion,  35 deg roation )   ? Time 6   ? Period Weeks   ? Status Partially Met   ? Target Date 10/31/21   ?  ? PT LONG TERM GOAL  #9  ? TITLE Pt wil

## 2021-11-14 NOTE — Patient Instructions (Addendum)
? ? ?  Clam shells with R side using green band 20 reps  ?  ?  ?Use green band for the exercise on your belly, pull band away from hips 30 deg each side, on exhale, ? Hold band with knot by thumb side and thumbs pointing out  ?Do not lift too high from belly  ? ? ?Standing on R leg ballmounds pressure ?Hand on wall ?On a foam / pillow  ?30 sec x 2 sets ?

## 2021-11-20 ENCOUNTER — Ambulatory Visit: Payer: Medicare Other | Attending: Orthopaedic Surgery | Admitting: Physical Therapy

## 2021-11-20 DIAGNOSIS — R2689 Other abnormalities of gait and mobility: Secondary | ICD-10-CM | POA: Insufficient documentation

## 2021-11-20 DIAGNOSIS — M533 Sacrococcygeal disorders, not elsewhere classified: Secondary | ICD-10-CM | POA: Insufficient documentation

## 2021-11-20 DIAGNOSIS — M62838 Other muscle spasm: Secondary | ICD-10-CM | POA: Diagnosis not present

## 2021-11-20 DIAGNOSIS — R293 Abnormal posture: Secondary | ICD-10-CM | POA: Diagnosis not present

## 2021-11-20 NOTE — Therapy (Signed)
[place] [facility] ?[place], [place], [address] ?Phone: [phone]   Fax:  [phone] ? ?Physical Therapy Treatment ? ?Patient Details  ?Name: MAN BONNEAU ?MRN: [id number] ?Date of Birth: Aug 14, 1945 ?Referring Provider (PT): Melrose Nakayama, MD ? ? ?Encounter Date: 11/20/2021 ? ? PT End of Session - 11/20/21 1301   ? ? Visit Number 24   ? Date for PT Re-Evaluation 11/28/21   eval 03/23/21, PN 05/30/21, 09/19/21  ? PT Start Time 1105   ? PT Stop Time 1205   ? PT Time Calculation (min) 60 min   ? Activity Tolerance Patient tolerated treatment well;No increased pain   ? Behavior During Therapy [facility] for tasks assessed/performed   ? ?  ?  ? ?  ? ? ?Past Medical History:  ?Diagnosis Date  ? Atypical nevus 01/05/1997  ? slight-mod- 3rd & 4th toe web Left foot -WS  ? BCC (basal cell carcinoma) 03/17/2004  ? Rigth lower inner leg (CX35Fu)  ? BCC (basal cell carcinoma) 12/03/2012  ? left upper back (CX35FU)  ? BCC (basal cell carcinoma) x 2 04/09/2006  ? right forearm (MOHS) left scalp (MOHS)  ? Bowen's disease x 3 08/26/2015  ? Left calf medial- (CX35FU) , Left calf sup. (CX35FU), Left calf inf. (CX35FU)  ? bowens 05/05/1996  ? lower right shin/outer side (CX35FU)  ? CHF (congestive heart failure) ([place])   ? Coronary artery disease   ? Dyspnea   ? with exercise  ? Nodular basal cell carcinoma (BCC) 07/04/2018  ? left side lower leg (MOHS)  ? Osteopenia 02/2019  ? T score -1.9 FRAX 18% / 9.6%  ? Ovarian cancer ([place]) 1990  ? Stage 3  ? SCC (squamous cell carcinoma) 08/17/2003  ? left ear (CX35FU)  ? SCC (squamous cell carcinoma) 03/17/2004  ? right ant. shin (CX35FU)  ? SCC (squamous cell carcinoma) 09/10/2017  ? left lower shin  ? SCC (squamous cell carcinoma) x 2 12/03/2012  ? left outer calf (CX35FU) right cheek (CX35FU)  ? SCC (squamous cell carcinoma) x 2 06/29/2014  ? right cheek, lateral (MOHS) Right cheek,medial (MOHS)  ? SCC (squamous cell carcinoma) x 3  07/04/2018  ? back of crown, inf, Left lower leg (CX35FU), Right lower calf (CX35FU)  ? Squamous cell carcinoma in situ 05/03/2021  ? R Submandibular Are (CX35FU)  ? Squamous cell carcinoma of skin 11/02/2019  ? in situ on right lower leg, posterior - CX3+5FU  ? Squamous cell carcinoma of skin 11/02/2019  ? in situ on left lower leg, posterior - CX3+5FU  ? Superficial basal cell carcinoma (BCC) 05/05/1996  ? left right shin inner side (CX35FU)  ? Superficial basal cell carcinoma (BCC) 08/24/2014  ? right submandibular (Dr Aundra Millet bx'd )  ? ? ?Past Surgical History:  ?Procedure Laterality Date  ? ABDOMINAL HYSTERECTOMY  1990  ? TAH BSO  ? Bowel obstruction    ? BREAST BIOPSY Right 11/27/2002  ? CARDIAC CATHETERIZATION  07/11/2017  ? INTRAVASCULAR PRESSURE WIRE/FFR STUDY N/A 07/11/2017  ? Procedure: INTRAVASCULAR PRESSURE WIRE/FFR STUDY;  Surgeon: Sherren Mocha, MD;  Location: [facility];  Service: Cardiovascular;  Laterality: N/A;  ? OOPHORECTOMY    ? BSO  ? RIGHT/LEFT HEART CATH AND CORONARY ANGIOGRAPHY N/A 07/11/2017  ? Procedure: RIGHT/LEFT HEART CATH AND CORONARY ANGIOGRAPHY;  Surgeon: Sherren Mocha, MD;  Location: [facility];  Service: Cardiovascular;  Laterality: N/A;  ? Squamous cell and Basal cell excision    ?  VIDEO BRONCHOSCOPY Bilateral 02/15/2015  ? Procedure: VIDEO BRONCHOSCOPY WITH FLUORO;  Surgeon: Juanito Doom, MD;  Location: [facility] ENDOS[facility];  Service: Cardiopulmonary;  Laterality: Bilateral;  ? ? ?There were no vitals filed for this visit. ? ? Subjective Assessment - 11/20/21 1110   ? ? Subjective Pt tried the elliptical backwards and she felt it in ther gluts but the sharp pain did not occur.   ? Pertinent History Hx of ovarian CA with Tx of hysterectomy, chemotherapy,1990,  Skin squamous/basal cell carcinoma removal at top of head, face, legs,arms, eye lid,   CAD, SOB with exercises, 1 vaginal deliveries, Side effects from chemotherapy: loss of hearing ( wears hearing aids B),  neuropathy of feet and hands. Hx of ankle sprains in B, Hx of falls x 3 in the past 6 months ( 6-7 falls in one year).  Physical activities: Prior to getting COVID 02/2021, pt used to play tennis 3 x week, yoga, spin classes, 3 miles. Long COVID Sx include: dry cough and tiredness. Pt noticed her stamina has not returned with playing tennis by the last set in 90 min periods compared to being able to play for 2 -2.5 hours.   ? Patient Stated Goals work this pain and be back on the bike   ? ?  ?  ? ?  ? ? ? ? ? [facility] PT Assessment - 11/20/21 1551   ? ?  ? Palpation  ? Palpation comment hypomobile B midfoot joints B, leg and ankle B in DF/EV/ toe abduction   ? ?  ?  ? ?  ? ? ? ? ? ? ? ? ? ? ? ? ? ? ? ? [facility] Adult PT Treatment/Exercise - 11/20/21 1305   ? ?  ? Neuro Re-ed   ? Neuro Re-ed Details  cued for DF/EV, toe abduction, less medial collapse and genu valgus   ?  ? Manual Therapy  ? Manual therapy comments STM/MWM to address midfoot joints B , leg anterior/ lateral B tightness   ? ?  ?  ? ?  ? ? ? ? ? ? ? ? ? ? ? ? ? ? ? PT Long Term Goals - 09/26/21 1058   ? ?  ? PT LONG TERM GOAL #1  ? Title Pt will report decreased pain from 4/10 to  < 2/10 after 15 min of sitting on clinic soft chair and being able to sit on hard chairs with 50% less pain inorder to sit incomunity settings   ? Time 6   ? Period Weeks   ? Status Achieved   ?  ? PT LONG TERM GOAL #2  ? Title Pt will have no flareups of pain in R buttock after walking on pavement for 30 min   ? Time 8   ? Period Weeks   ? Status Achieved   ?  ? PT LONG TERM GOAL #3  ? Title Pt will be able ride a bike in spin class for 25 min and be compliant stretches and HEP in order to return to biking stationery bike   ? Time 10   ? Period Weeks   ? Status On-going   ? Target Date 11/28/21   ?  ? PT LONG TERM GOAL #4  ? Title Pt will demo no trunk lean and more levelled pelvis and shoulders, and space between rib and iliac crest will be equal in order to minimize pain with  sitting and walking and biking  and progress  to deep core exercises to maintain alignment   ? Baseline 3 fingers on R flank, 4 fingers L flank between lowest rib and iliac crest   ? Time 2   ? Period Weeks   ? Status Achieved   ?  ? PT LONG TERM GOAL #5  ? Title Pt will improve her FOTO score from 57 pts to  > 67 pts inorder to demonstrate improved functional mobility  ( 06/13/21: 79 pts)   ? Baseline 57 pts   ? Time 10   ? Period Weeks   ? Status Achieved   ?  ? PT LONG TERM GOAL #6  ? Title Pt will demonstrate decreased scar restrictions along abdominal scar in order to progress to more strengthening exercises for trunk stability for decreased risk of falls   ? Time 5   ? Period Weeks   ? Status Achieved   ?  ? PT LONG TERM GOAL #7  ? Title Pt will demo increased R hip abduction strength from 2/5 to > 4/5 and no tenderness at ischial tuberosity and glut med in order to improve pelvic stability for balance and mounting a bike. 91/17/23: 4/5)   ? Time 8   ? Period Weeks   ? Status Achieved   ?  ? PT LONG TERM GOAL #8  ? Title Pt will increase cervical mobility to look over shoulder when biking in order to minimize falling off bike and maintaining balance while walking   ? Baseline cervical rotation R 30 deg, L 40 deg,  flexion R 25 deg, L 30 deg,   ( 09/19/21: L 35 with pain , R 40 deg sideflexion,  35 deg roation )   ? Time 6   ? Period Weeks   ? Status Partially Met   ? Target Date 10/31/21   ?  ? PT LONG TERM GOAL  #9  ? TITLE Pt will increase cervical mm endurance test time from  2 min, 48 sec  -> > 3 min in order to minimize neck pain and demo no more forward head posture / throacic kyphosis   ? Baseline 09/19/21:  2: 15   ? Time 4   ? Period Weeks   ? Status On-going   ? Target Date 11/14/21   ?  ? PT LONG TERM GOAL  #10  ? TITLE Pt will increase SLS on uneven surface without UE support to > 30 sec without toe gripping,  in order to prepare for hiking on rugged terrain on her trip to [facility], jump on  rocks   ? Baseline 22 sec R, 12 on L  on uneven surface without UE support   ? Time 8   ? Period Weeks   ? Status New   ? Target Date 11/14/21   ? ?  ?  ? ?  ? ? ? ? ? ? ? ? Plan - 11/20/21 1301   ? ? Clinical

## 2021-11-20 NOTE — Patient Instructions (Signed)
? ?  Feet slides : ?  ?Points of contact at sitting bones  ?Four points of contact of foot,  ?Side knee back while keeping knee out along 2-3rd toe line  ? ?Heel up, ankle not twist out ?Lower heel while keeping knee out along 2-3rd toe line ?Four points of contact of foot, ?Slide foot back while keeping knee out along 2-3rd toe line ?  ?Repeated with other foot  ?  ? ?__ ? Single heel raises with hand on counter ?10 reps  ? ?

## 2021-12-07 DIAGNOSIS — Z23 Encounter for immunization: Secondary | ICD-10-CM | POA: Diagnosis not present

## 2021-12-11 ENCOUNTER — Ambulatory Visit: Payer: Medicare Other | Admitting: Physical Therapy

## 2021-12-11 DIAGNOSIS — M533 Sacrococcygeal disorders, not elsewhere classified: Secondary | ICD-10-CM

## 2021-12-11 DIAGNOSIS — M62838 Other muscle spasm: Secondary | ICD-10-CM | POA: Diagnosis not present

## 2021-12-11 DIAGNOSIS — R2689 Other abnormalities of gait and mobility: Secondary | ICD-10-CM | POA: Diagnosis not present

## 2021-12-11 DIAGNOSIS — R293 Abnormal posture: Secondary | ICD-10-CM | POA: Diagnosis not present

## 2021-12-11 NOTE — Therapy (Signed)
[place] [facility] [place], [place], [address] Phone: [phone]   Fax:  [phone]  Physical Therapy Treatment  / recert   Patient Details  Name: Barbara Schwartz MRN: [id number] Date of Birth: 1946/06/24 Referring Provider (PT): Melrose Nakayama, MD   Encounter Date: 12/11/2021   PT End of Session - 12/11/21 1027     Visit Number 25    Date for PT Re-Evaluation 02/19/22   eval 03/23/21, PN 05/30/21, 09/19/21   PT Start Time 0907    PT Stop Time 1020    PT Time Calculation (min) 73 min    Activity Tolerance Patient tolerated treatment well;No increased pain    Behavior During Therapy [facility] for tasks assessed/performed             Past Medical History:  Diagnosis Date   Atypical nevus 01/05/1997   slight-mod- 3rd & 4th toe web Left foot -WS   BCC (basal cell carcinoma) 03/17/2004   Rigth lower inner leg (CX35Fu)   BCC (basal cell carcinoma) 12/03/2012   left upper back (CX35FU)   BCC (basal cell carcinoma) x 2 04/09/2006   right forearm (MOHS) left scalp (MOHS)   Bowen's disease x 3 08/26/2015   Left calf medial- (CX35FU) , Left calf sup. (CX35FU), Left calf inf. (CX35FU)   bowens 05/05/1996   lower right shin/outer side (CX35FU)   CHF (congestive heart failure) (HCC)    Coronary artery disease    Dyspnea    with exercise   Nodular basal cell carcinoma (BCC) 07/04/2018   left side lower leg (MOHS)   Osteopenia 02/2019   T score -1.9 FRAX 18% / 9.6%   Ovarian cancer ([place]) 1990   Stage 3   SCC (squamous cell carcinoma) 08/17/2003   left ear (CX35FU)   SCC (squamous cell carcinoma) 03/17/2004   right ant. shin (CX35FU)   SCC (squamous cell carcinoma) 09/10/2017   left lower shin   SCC (squamous cell carcinoma) x 2 12/03/2012   left outer calf (CX35FU) right cheek (CX35FU)   SCC (squamous cell carcinoma) x 2 06/29/2014   right cheek, lateral (MOHS) Right cheek,medial (MOHS)   SCC (squamous cell  carcinoma) x 3 07/04/2018   back of crown, inf, Left lower leg (CX35FU), Right lower calf (CX35FU)   Squamous cell carcinoma in situ 05/03/2021   R Submandibular Are (CX35FU)   Squamous cell carcinoma of skin 11/02/2019   in situ on right lower leg, posterior - CX3+5FU   Squamous cell carcinoma of skin 11/02/2019   in situ on left lower leg, posterior - CX3+5FU   Superficial basal cell carcinoma (BCC) 05/05/1996   left right shin inner side (CX35FU)   Superficial basal cell carcinoma (BCC) 08/24/2014   right submandibular (Dr Aundra Millet bx'd )    Past Surgical History:  Procedure Laterality Date   ABDOMINAL HYSTERECTOMY  1990   TAH BSO   Bowel obstruction     BREAST BIOPSY Right 11/27/2002   CARDIAC CATHETERIZATION  07/11/2017   INTRAVASCULAR PRESSURE WIRE/FFR STUDY N/A 07/11/2017   Procedure: INTRAVASCULAR PRESSURE WIRE/FFR STUDY;  Surgeon: Sherren Mocha, MD;  Location: [facility];  Service: Cardiovascular;  Laterality: N/A;   OOPHORECTOMY     BSO   RIGHT/LEFT HEART CATH AND CORONARY ANGIOGRAPHY N/A 07/11/2017   Procedure: RIGHT/LEFT HEART CATH AND CORONARY ANGIOGRAPHY;  Surgeon: Sherren Mocha, MD;  Location: [facility];  Service: Cardiovascular;  Laterality: N/A;   Squamous cell  and Basal cell excision     VIDEO BRONCHOSCOPY Bilateral 02/15/2015   Procedure: VIDEO BRONCHOSCOPY WITH FLUORO;  Surgeon: Juanito Doom, MD;  Location: [facility];  Service: Cardiopulmonary;  Laterality: Bilateral;    There were no vitals filed for this visit.   Subjective Assessment - 12/11/21 1114     Subjective Pt surprised herself whenshe got caught in the tennis net and almost fell backwards but she ended up in a deep squat and yelled, "Up" and she was able to stand back up. She still has not fallen on the tennis court.    Pertinent History Hx of ovarian CA with Tx of hysterectomy, chemotherapy,1990,  Skin squamous/basal cell carcinoma removal at top of head, face, legs,arms,  eye lid,   CAD, SOB with exercises, 1 vaginal deliveries, Side effects from chemotherapy: loss of hearing ( wears hearing aids B), neuropathy of feet and hands. Hx of ankle sprains in B, Hx of falls x 3 in the past 6 months ( 6-7 falls in one year).  Physical activities: Prior to getting COVID 02/2021, pt used to play tennis 3 x week, yoga, spin classes, 3 miles. Long COVID Sx include: dry cough and tiredness. Pt noticed her stamina has not returned with playing tennis by the last set in 90 min periods compared to being able to play for 2 -2.5 hours.    Patient Stated Goals work this pain and be back on the bike                [facility] PT Assessment - 12/11/21 1028       Other:   Other/ Comments COM more posterior in SLS exercise that sikmulates mounting and dismounting bike      Palpation   Palpation comment tightness along lateral ankle, hypomobile R foot                           [facility] Adult PT Treatment/Exercise - 12/11/21 1029       Therapeutic Activites    Other Therapeutic Activities 5 trials each with yard stick between two chairs to simulate bike mounting, 5 trials higher hieght. Bilateral 5 reps.  UE support on back of chair to simulate bike handle bars,  practiced 3 trials with half moon phase with UE support and wall as tactile cue B to promote stability of one leg that simulates dis/mounting bike      Neuro Re-ed    Neuro Re-ed Details  cued for more anterior COM o and pivot on ballmounds on supporting leg when mounting. dismounting bike, pain science education, cued for self-massage for feet      Modalities   Modalities Moist Heat      Moist Heat Therapy   Number Minutes Moist Heat 5 Minutes    Moist Heat Location --   hip, guided relaxation     Manual Therapy   Manual therapy comments STM/MWM to address midfoot joints R , leg R tightness                        PT Long Term Goals - 12/11/21 1033       PT LONG TERM GOAL #1   Title  Pt will report decreased pain from 4/10 to  < 2/10 after 15 min of sitting on clinic soft chair and being able to sit on hard chairs with 50% less pain inorder to sit incomunity settings  Time 6    Period Weeks    Status Achieved      PT LONG TERM GOAL #2   Title Pt will have no flareups of pain in R buttock after walking on pavement for 30 min    Time 8    Period Weeks    Status Achieved      PT LONG TERM GOAL #3   Title Pt will be able ride a bike in spin class for 25 min and be compliant stretches and HEP in order to return to biking stationery bike  ( 12/11/21: 45 min)    Time 10    Period Weeks    Status Achieved    Target Date 11/28/21      PT LONG TERM GOAL #4   Title Pt will demo no trunk lean and more levelled pelvis and shoulders, and space between rib and iliac crest will be equal in order to minimize pain with sitting and walking and biking  and progress to deep core exercises to maintain alignment    Baseline 3 fingers on R flank, 4 fingers L flank between lowest rib and iliac crest    Time 2    Period Weeks    Status Achieved      PT LONG TERM GOAL #5   Title Pt will improve her FOTO score from 57 pts to  > 67 pts inorder to demonstrate improved functional mobility  ( 06/13/21: 79 pts, 12/11/21:  91 pts)    Baseline 57 pts    Time 10    Period Weeks    Status Achieved      PT LONG TERM GOAL #6   Title Pt will demonstrate decreased scar restrictions along abdominal scar in order to progress to more strengthening exercises for trunk stability for decreased risk of falls    Time 5    Period Weeks    Status Achieved      PT LONG TERM GOAL #7   Title Pt will demo increased R hip abduction strength from 2/5 to > 4/5 and no tenderness at ischial tuberosity and glut med in order to improve pelvic stability for balance and mounting a bike. 91/17/23: 4/5)    Time 8    Period Weeks    Status Achieved      PT LONG TERM GOAL #8   Title Pt will increase cervical mobility  to look over shoulder when biking in order to minimize falling off bike and maintaining balance while walking    Baseline cervical rotation R 30 deg, L 40 deg,  flexion R 25 deg, L 30 deg,   ( 09/19/21: L 35 with pain , R 40 deg sideflexion,  35 deg roation )    Time 6    Period Weeks    Status Partially Met    Target Date 01/22/22      PT LONG TERM GOAL  #9   TITLE Pt will increase cervical mm endurance test time from  2 min, 48 sec  -> > 3 min in order to minimize neck pain and demo no more forward head posture / throacic kyphosis    Baseline 09/19/21:  2: 15    Time 4    Period Weeks    Status On-going    Target Date 01/08/22      PT LONG TERM GOAL  #10   TITLE Pt will increase SLS on uneven surface without UE support to > 30 sec without toe gripping,  in order to prepare for hiking on rugged terrain on her trip to [facility], jump on rocks    Baseline 22 sec R, 12 on L  on uneven surface without UE support    Time 8    Period Weeks    Status On-going    Target Date 02/05/22               Plan - 12/11/21 1028     Clinical Impression Statement Pt has achieved 7/10 goals and progressing well towards remaining goals.FOTO score for hip pain improved signficantly from 57 pts to 91 pts.   Pt has progressed to elliptical walking with forward and with emphasis on backward motion to lengthen hamstring which is typcially shortened in biking and to also strengthen gluts which is typically under trained with biking. Pt reports no pain only tightness at the R glut with these new activities. Pt continues to ride on stationery bike. Educated pt to try biking on her own bike on a trainer and then in 2 weeks try to bike at a walking track without pedestricians to gain confidence in a setting with less unexpected factors that could challenge her balance on bike. Pt cvoiced understanding. Pt voiced fear of falling on her bike which was the cause of her injury.  Provided encouragement and reminded  pt about her report that she has not had any falls on the tennis court compared to the frequent falls she used to have prior to [facility].    Provided pain science education and practiced mounting/ dismounting bike with simulated heights for stepping over bike Provided cues for more anterior COM over SLS with pivot on ballmound to minimize misalignment of knee. Pt also still required cues for less genu valgus of R knee in sit to stand today. PPt showed tightness along lower kinetic chain of R foot and ankle which is likelt related to her hip issues. Plan to continues improving mobility from hip to lower kinetic chain at upcoming sessions. Anticipate pt will progress towards her gaosl related to returning to biking and being able to hike at [facility] with grandson in end of July.      Personal Factors and Comorbidities Comorbidity 3+    Comorbidities Hx of ovarian CA with Tx of hysterectomy, chemotherapy,1990,  Skin squamous/basal cell carcinoma removal at top of head, face, legs,arms, eye lid, , CAD, SOB with exercises, 1 vaginal deliveries, Side effects from chemotherapy: loss of hearing ( wears hearing aids B), neuropathy of feet and hands. Hx of ankle sprains in B, Hx of falls x 3 in the past 6 months ( 6-7 falls in one year). Physical activities: Prior to getting COVID 02/2021, pt used to play tennis 3 x week, yoga, spin classes, 3 miles. Long COVID Sx include: dry coough and tiredness. Pt noticed her stamina has not returned with playing tennis by the last set in 90 min periods compared to being able to play for 2 -2.5 hours    Examination-Activity Limitations Locomotion Level;Other    Stability/Clinical Decision Making Evolving/Moderate complexity    Rehab Potential Good    PT Frequency 1x / week    PT Duration Other (comment)   10   PT Treatment/Interventions Gait training;Stair training;Neuromuscular re-education;Moist Heat;Traction;Therapeutic activities;Therapeutic exercise;Patient/family  education;Manual techniques;Energy conservation;Splinting;Taping;Cryotherapy;Balance training;ADLs/Self Care Home Management;Scar mobilization;Spinal Manipulations;Joint Manipulations;Functional mobility training    Consulted and Agree with Plan of Care Patient             Patient will benefit from  skilled therapeutic intervention in order to improve the following deficits and impairments:  Decreased endurance, Decreased activity tolerance, Decreased balance, Difficulty walking, Impaired flexibility, Impaired vision/preception, Decreased strength, Decreased mobility, Decreased coordination, Decreased scar mobility, Abnormal gait, Improper body mechanics, Pain, Increased muscle spasms, Postural dysfunction, Impaired perceived functional ability, Hypomobility, Decreased range of motion, Increased fascial restricitons  Visit Diagnosis: Other abnormalities of gait and mobility  Sacrococcygeal disorders, not elsewhere classified  Other muscle spasm  Abnormal posture     Problem List Patient Active Problem List   Diagnosis Date Noted   Hyperlipidemia 08/06/2018   Coronary artery disease with exertional angina ([place]) 07/11/2017   CAD (coronary artery disease) 07/11/2017   Dyspnea 05/01/2016   Solitary pulmonary nodule 05/02/2015   Adverse effects of medication 02/14/2015   Cough 01/28/2015   Bronchiectasis without acute exacerbation ([place]) 01/28/2015   Shadow 01/25/2015   Ovarian ca ([place])    Osteopenia     Jerl Mina, PT 12/11/2021, 11:20 AM  [place], [place], [address] Phone: [phone]   Fax:  [phone]  Name: Barbara Schwartz MRN: [id number] Date of Birth: 1945/08/19

## 2021-12-11 NOTE — Patient Instructions (Addendum)
Half pose pose with chairs by wall ( see your video)  5-6 sec   3-5 reps  each leg  __  Practice simulated mounting bike exercise   ___  Try out using a trainer for real bike  The next two weeks but not take bike yet on the road   ___  Feet care :  Self -feet massage   Handshake : fingers between toes, moving ballmounds/toes back and forth several times while other hand anchors at arch. Do the same at the hind/mid foot.  Heel to toes upward to a letter Big Letter T strokes to spread ballmounds and toes, several times, pinch between webs of toes  Run finger tips along top of foot between long bones "comb between the bones"    Wiggle toes and spread them out when relaxing

## 2021-12-19 ENCOUNTER — Other Ambulatory Visit: Payer: Self-pay | Admitting: Obstetrics & Gynecology

## 2021-12-19 DIAGNOSIS — Z1231 Encounter for screening mammogram for malignant neoplasm of breast: Secondary | ICD-10-CM

## 2021-12-25 ENCOUNTER — Encounter: Payer: Medicare Other | Admitting: Physical Therapy

## 2022-01-12 DIAGNOSIS — I2510 Atherosclerotic heart disease of native coronary artery without angina pectoris: Secondary | ICD-10-CM | POA: Diagnosis not present

## 2022-01-12 DIAGNOSIS — Z1331 Encounter for screening for depression: Secondary | ICD-10-CM | POA: Diagnosis not present

## 2022-01-12 DIAGNOSIS — R296 Repeated falls: Secondary | ICD-10-CM | POA: Diagnosis not present

## 2022-01-12 DIAGNOSIS — E559 Vitamin D deficiency, unspecified: Secondary | ICD-10-CM | POA: Diagnosis not present

## 2022-01-12 DIAGNOSIS — Z0000 Encounter for general adult medical examination without abnormal findings: Secondary | ICD-10-CM | POA: Diagnosis not present

## 2022-01-12 DIAGNOSIS — R911 Solitary pulmonary nodule: Secondary | ICD-10-CM | POA: Diagnosis not present

## 2022-01-12 DIAGNOSIS — R6889 Other general symptoms and signs: Secondary | ICD-10-CM | POA: Diagnosis not present

## 2022-01-12 DIAGNOSIS — C44719 Basal cell carcinoma of skin of left lower limb, including hip: Secondary | ICD-10-CM | POA: Diagnosis not present

## 2022-01-12 DIAGNOSIS — E785 Hyperlipidemia, unspecified: Secondary | ICD-10-CM | POA: Diagnosis not present

## 2022-01-12 DIAGNOSIS — D500 Iron deficiency anemia secondary to blood loss (chronic): Secondary | ICD-10-CM | POA: Diagnosis not present

## 2022-01-12 DIAGNOSIS — H9190 Unspecified hearing loss, unspecified ear: Secondary | ICD-10-CM | POA: Diagnosis not present

## 2022-01-12 DIAGNOSIS — R053 Chronic cough: Secondary | ICD-10-CM | POA: Diagnosis not present

## 2022-01-12 DIAGNOSIS — I25119 Atherosclerotic heart disease of native coronary artery with unspecified angina pectoris: Secondary | ICD-10-CM | POA: Diagnosis not present

## 2022-01-12 DIAGNOSIS — R030 Elevated blood-pressure reading, without diagnosis of hypertension: Secondary | ICD-10-CM | POA: Diagnosis not present

## 2022-01-12 DIAGNOSIS — C4492 Squamous cell carcinoma of skin, unspecified: Secondary | ICD-10-CM | POA: Diagnosis not present

## 2022-01-15 ENCOUNTER — Ambulatory Visit: Payer: Medicare Other | Attending: Orthopaedic Surgery | Admitting: Physical Therapy

## 2022-01-15 DIAGNOSIS — M62838 Other muscle spasm: Secondary | ICD-10-CM | POA: Diagnosis not present

## 2022-01-15 DIAGNOSIS — M533 Sacrococcygeal disorders, not elsewhere classified: Secondary | ICD-10-CM | POA: Insufficient documentation

## 2022-01-15 DIAGNOSIS — R293 Abnormal posture: Secondary | ICD-10-CM | POA: Diagnosis not present

## 2022-01-15 DIAGNOSIS — R2689 Other abnormalities of gait and mobility: Secondary | ICD-10-CM | POA: Insufficient documentation

## 2022-01-30 ENCOUNTER — Encounter: Payer: Self-pay | Admitting: Obstetrics & Gynecology

## 2022-01-30 ENCOUNTER — Ambulatory Visit (INDEPENDENT_AMBULATORY_CARE_PROVIDER_SITE_OTHER): Payer: Medicare Other | Admitting: Obstetrics & Gynecology

## 2022-01-30 VITALS — BP 116/70 | HR 56 | Ht 63.75 in | Wt 115.0 lb

## 2022-01-30 DIAGNOSIS — Z803 Family history of malignant neoplasm of breast: Secondary | ICD-10-CM

## 2022-01-30 DIAGNOSIS — Z780 Asymptomatic menopausal state: Secondary | ICD-10-CM

## 2022-01-30 DIAGNOSIS — N952 Postmenopausal atrophic vaginitis: Secondary | ICD-10-CM | POA: Diagnosis not present

## 2022-01-30 DIAGNOSIS — Z8543 Personal history of malignant neoplasm of ovary: Secondary | ICD-10-CM

## 2022-01-30 DIAGNOSIS — Z9189 Other specified personal risk factors, not elsewhere classified: Secondary | ICD-10-CM

## 2022-01-30 DIAGNOSIS — Z01419 Encounter for gynecological examination (general) (routine) without abnormal findings: Secondary | ICD-10-CM

## 2022-01-30 DIAGNOSIS — M8580 Other specified disorders of bone density and structure, unspecified site: Secondary | ICD-10-CM

## 2022-01-30 NOTE — Progress Notes (Signed)
Barbara Schwartz [birth year] [id number]   History:    76 y.o. G2P1A1L1   RP:  Established patient presenting for annual gyn exam    HPI:  Postmenopausal/vaginal atrophy. Continues on Estring 2 mg for vaginal atrophy and decreased UTI recurrence. Prior TAH/BSO 1998 for ovarian cancer. History of stage IIIC ovarian cancer. She is followed with an annual CA-125.  She was apparently never genetically tested and she is not interested in pursuing this. Pap smear Neg 2017.  No significant history of abnormal Pap smears.  Breasts normal.  Mammogram Neg 01/2021.  Positive family history of breast cancer.  Would like an MRI of the breasts after the mammo. Colonoscopy 2021.  Osteopenia.  DEXA 02/2021.  T score -1.8 at the bilateral average total hips. Increased at the AP Spine, stable at the total hips.  BMI 19.89.  Plays tennis.  Health labs with Fam MD.   Past medical history,surgical history, family history and social history were all reviewed and documented in the EPIC chart.  Gynecologic History No LMP recorded (lmp unknown). Patient has had a hysterectomy.  Obstetric History OB History  Gravida Para Term Preterm AB Living  '2 1 1   1 1  '$ SAB IAB Ectopic Multiple Live Births  1            # Outcome Date GA Lbr Len/2nd Weight Sex Delivery Anes PTL Lv  2 SAB           1 Term              ROS: A ROS was performed and pertinent positives and negatives are included in the history. GENERAL: No fevers or chills. HEENT: No change in vision, no earache, sore throat or sinus congestion. NECK: No pain or stiffness. CARDIOVASCULAR: No chest pain or pressure. No palpitations. PULMONARY: No shortness of breath, cough or wheeze. GASTROINTESTINAL: No abdominal pain, nausea, vomiting or diarrhea, melena or bright red blood per rectum. GENITOURINARY: No urinary frequency, urgency, hesitancy or dysuria. MUSCULOSKELETAL: No joint or muscle pain, no back pain, no recent trauma. DERMATOLOGIC: No rash, no itching, no  lesions. ENDOCRINE: No polyuria, polydipsia, no heat or cold intolerance. No recent change in weight. HEMATOLOGICAL: No anemia or easy bruising or bleeding. NEUROLOGIC: No headache, seizures, numbness, tingling or weakness. PSYCHIATRIC: No depression, no loss of interest in normal activity or change in sleep pattern.     Exam:   BP 116/70   Pulse (!) 56   Ht 5' 3.75" (1.619 m)   Wt 115 lb (52.2 kg)   LMP  (LMP Unknown)   SpO2 98%   BMI 19.89 kg/m   Body mass index is 19.89 kg/m.  General appearance : Well developed well nourished female. No acute distress HEENT: Eyes: no retinal hemorrhage or exudates,  Neck supple, trachea midline, no carotid bruits, no thyroidmegaly Lungs: Clear to auscultation, no rhonchi or wheezes, or rib retractions  Heart: Regular rate and rhythm, no murmurs or gallops Breast:Examined in sitting and supine position were symmetrical in appearance, no palpable masses or tenderness,  no skin retraction, no nipple inversion, no nipple discharge, no skin discoloration, no axillary or supraclavicular lymphadenopathy Abdomen: no palpable masses or tenderness, no rebound or guarding Extremities: no edema or skin discoloration or tenderness  Pelvic: Vulva: Normal             Vagina: No gross lesions or discharge  Cervix/Uterus absent  Adnexa  Without masses or tenderness  Anus: Normal   Assessment/Plan:  76 y.o. female for annual exam   1. Encounter for gynecological examination without abnormal finding Postmenopausal/vaginal atrophy. Continues on Estring 2 mg for vaginal atrophy and decreased UTI recurrence. Prior TAH/BSO 1998 for ovarian cancer. History of stage IIIC ovarian cancer. She is followed with an annual CA-125.  She was apparently never genetically tested and she is not interested in pursuing this. Pap smear Neg 2017.  No significant history of abnormal Pap smears.  Breasts normal.  Mammogram Neg 01/2021.  Positive family history of breast cancer.  Would  like an MRI of the breasts after the mammo. Colonoscopy 2021.  Osteopenia.  DEXA 02/2021.  T score -1.8 at the bilateral average total hips. Increased at the AP Spine, stable at the total hips.  BMI 19.89.  Plays tennis.  Health labs with Fam MD.  2. Postmenopause Postmenopausal/vaginal atrophy. Continues on Estring 2 mg for vaginal atrophy and decreased UTI recurrence.   3. Postmenopausal atrophic vaginitis Postmenopausal/vaginal atrophy. Continues on Estring 2 mg for vaginal atrophy and decreased UTI recurrence.   4. Osteopenia, unspecified location Osteopenia.  DEXA 02/2021.  T score -1.8 at the bilateral average total hips. Increased at the AP Spine, stable at the total hips.  Will repeat BD in 02/2023.  5. History of ovarian cancer Will screen with Ca 125 today. - CA 125  6. Family history of breast cancer Mammo scheduled.  Will add an MRI of the breasts after we get the results.  7. 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

## 2022-01-31 LAB — CA 125: CA 125: 14 U/mL (ref <35)

## 2022-02-02 ENCOUNTER — Ambulatory Visit
Admission: RE | Admit: 2022-02-02 | Discharge: 2022-02-02 | Disposition: A | Discharge status: Home / Self Care | Payer: Medicare Other | Source: Ambulatory Visit | Attending: Obstetrics & Gynecology | Admitting: Obstetrics & Gynecology

## 2022-02-02 DIAGNOSIS — Z1231 Encounter for screening mammogram for malignant neoplasm of breast: Secondary | ICD-10-CM | POA: Diagnosis not present

## 2022-02-20 ENCOUNTER — Other Ambulatory Visit: Payer: Self-pay | Admitting: Obstetrics & Gynecology

## 2022-02-20 NOTE — Telephone Encounter (Signed)
Last annual exam 7/23 "Postmenopausal/vaginal atrophy. Continues on Estring 2 mg for vaginal atrophy and decreased UTI recurrence."  Rx sent with refills x 1 year.

## 2022-02-21 DIAGNOSIS — I25119 Atherosclerotic heart disease of native coronary artery with unspecified angina pectoris: Secondary | ICD-10-CM | POA: Diagnosis not present

## 2022-02-21 DIAGNOSIS — R296 Repeated falls: Secondary | ICD-10-CM | POA: Diagnosis not present

## 2022-02-21 DIAGNOSIS — R053 Chronic cough: Secondary | ICD-10-CM | POA: Diagnosis not present

## 2022-02-27 ENCOUNTER — Ambulatory Visit: Payer: Medicare Other | Admitting: Dermatology

## 2022-03-28 DIAGNOSIS — H903 Sensorineural hearing loss, bilateral: Secondary | ICD-10-CM | POA: Diagnosis not present

## 2022-04-09 DIAGNOSIS — L920 Granuloma annulare: Secondary | ICD-10-CM | POA: Diagnosis not present

## 2022-04-09 DIAGNOSIS — L814 Other melanin hyperpigmentation: Secondary | ICD-10-CM | POA: Diagnosis not present

## 2022-04-09 DIAGNOSIS — D099 Carcinoma in situ, unspecified: Secondary | ICD-10-CM | POA: Diagnosis not present

## 2022-04-09 DIAGNOSIS — L578 Other skin changes due to chronic exposure to nonionizing radiation: Secondary | ICD-10-CM | POA: Diagnosis not present

## 2022-04-09 DIAGNOSIS — D229 Melanocytic nevi, unspecified: Secondary | ICD-10-CM | POA: Diagnosis not present

## 2022-04-09 DIAGNOSIS — L570 Actinic keratosis: Secondary | ICD-10-CM | POA: Diagnosis not present

## 2022-04-09 DIAGNOSIS — L720 Epidermal cyst: Secondary | ICD-10-CM | POA: Diagnosis not present

## 2022-04-09 DIAGNOSIS — L821 Other seborrheic keratosis: Secondary | ICD-10-CM | POA: Diagnosis not present

## 2022-04-18 DIAGNOSIS — Z23 Encounter for immunization: Secondary | ICD-10-CM | POA: Diagnosis not present

## 2022-04-27 ENCOUNTER — Telehealth: Payer: Self-pay | Admitting: *Deleted

## 2022-04-27 DIAGNOSIS — Z803 Family history of malignant neoplasm of breast: Secondary | ICD-10-CM

## 2022-04-27 NOTE — Telephone Encounter (Signed)
Patient called requesting follow up in regards to Dr. Dellis Filbert note: 01/30/2022  pertaining to MRI see note below :  Family history of breast cancer Mammo scheduled.  Will add an MRI of the breasts after we get the results.   I will route to referral coordinator  to set MRI up for patient.

## 2022-04-30 NOTE — Telephone Encounter (Signed)
Dr.Lavoie patient had screening mammogram done on 02/02/22 it appears Barbara Schwartz has yearly breast MRI's, last breast MRI was done on 05/30/21. Okay to order breast MRI?

## 2022-05-03 NOTE — Telephone Encounter (Signed)
Dr.Lavoie agreed with breast MRI. Order placed at [facility] imaging they will call to schedule.

## 2022-05-07 DIAGNOSIS — Z23 Encounter for immunization: Secondary | ICD-10-CM | POA: Diagnosis not present

## 2022-05-07 NOTE — Telephone Encounter (Signed)
Breast MRI scheduled on 05/22/22.   Routed to prior approval checked as well.

## 2022-05-10 DIAGNOSIS — H838X3 Other specified diseases of inner ear, bilateral: Secondary | ICD-10-CM | POA: Diagnosis not present

## 2022-05-10 DIAGNOSIS — H903 Sensorineural hearing loss, bilateral: Secondary | ICD-10-CM | POA: Diagnosis not present

## 2022-05-11 ENCOUNTER — Other Ambulatory Visit: Payer: Self-pay | Admitting: Otolaryngology

## 2022-05-11 DIAGNOSIS — H903 Sensorineural hearing loss, bilateral: Secondary | ICD-10-CM

## 2022-05-17 DIAGNOSIS — L570 Actinic keratosis: Secondary | ICD-10-CM | POA: Diagnosis not present

## 2022-05-17 DIAGNOSIS — T50905A Adverse effect of unspecified drugs, medicaments and biological substances, initial encounter: Secondary | ICD-10-CM | POA: Diagnosis not present

## 2022-05-17 DIAGNOSIS — L539 Erythematous condition, unspecified: Secondary | ICD-10-CM | POA: Diagnosis not present

## 2022-05-21 ENCOUNTER — Ambulatory Visit
Admission: RE | Admit: 2022-05-21 | Discharge: 2022-05-21 | Disposition: A | Discharge status: Home / Self Care | Payer: Medicare Other | Source: Ambulatory Visit | Attending: Obstetrics & Gynecology | Admitting: Obstetrics & Gynecology

## 2022-05-21 DIAGNOSIS — Z803 Family history of malignant neoplasm of breast: Secondary | ICD-10-CM

## 2022-05-21 DIAGNOSIS — N6489 Other specified disorders of breast: Secondary | ICD-10-CM | POA: Diagnosis not present

## 2022-05-21 MED ORDER — GADOPICLENOL 0.5 MMOL/ML IV SOLN
6.0000 mL | Freq: Once | INTRAVENOUS | Status: AC | PRN
  Administered 2022-05-21: 6 mL via INTRAVENOUS

## 2022-05-22 ENCOUNTER — Other Ambulatory Visit: Payer: Medicare Other

## 2022-05-24 ENCOUNTER — Encounter: Payer: Self-pay | Admitting: Obstetrics & Gynecology

## 2022-05-25 NOTE — Telephone Encounter (Signed)
Per ML: "Please message Dr Owens Shark for correction of the note."

## 2022-05-31 ENCOUNTER — Other Ambulatory Visit: Payer: Medicare Other

## 2022-05-31 DIAGNOSIS — L570 Actinic keratosis: Secondary | ICD-10-CM | POA: Diagnosis not present

## 2022-05-31 DIAGNOSIS — T50905A Adverse effect of unspecified drugs, medicaments and biological substances, initial encounter: Secondary | ICD-10-CM | POA: Diagnosis not present

## 2022-06-11 DIAGNOSIS — C44729 Squamous cell carcinoma of skin of left lower limb, including hip: Secondary | ICD-10-CM | POA: Diagnosis not present

## 2022-06-11 DIAGNOSIS — D485 Neoplasm of uncertain behavior of skin: Secondary | ICD-10-CM | POA: Diagnosis not present

## 2022-06-11 DIAGNOSIS — L905 Scar conditions and fibrosis of skin: Secondary | ICD-10-CM | POA: Diagnosis not present

## 2022-06-11 DIAGNOSIS — D044 Carcinoma in situ of skin of scalp and neck: Secondary | ICD-10-CM | POA: Diagnosis not present

## 2022-06-18 ENCOUNTER — Ambulatory Visit
Admission: RE | Admit: 2022-06-18 | Discharge: 2022-06-18 | Disposition: A | Discharge status: Home / Self Care | Payer: Medicare Other | Source: Ambulatory Visit | Attending: Otolaryngology | Admitting: Otolaryngology

## 2022-06-18 DIAGNOSIS — H9042 Sensorineural hearing loss, unilateral, left ear, with unrestricted hearing on the contralateral side: Secondary | ICD-10-CM | POA: Diagnosis not present

## 2022-06-18 DIAGNOSIS — H903 Sensorineural hearing loss, bilateral: Secondary | ICD-10-CM

## 2022-06-18 MED ORDER — GADOPICLENOL 0.5 MMOL/ML IV SOLN
5.0000 mL | Freq: Once | INTRAVENOUS | Status: AC | PRN
  Administered 2022-06-18: 5 mL via INTRAVENOUS

## 2022-06-25 DIAGNOSIS — L905 Scar conditions and fibrosis of skin: Secondary | ICD-10-CM | POA: Diagnosis not present

## 2022-06-25 DIAGNOSIS — D485 Neoplasm of uncertain behavior of skin: Secondary | ICD-10-CM | POA: Diagnosis not present

## 2022-06-25 DIAGNOSIS — D044 Carcinoma in situ of skin of scalp and neck: Secondary | ICD-10-CM | POA: Diagnosis not present

## 2022-06-27 DIAGNOSIS — E785 Hyperlipidemia, unspecified: Secondary | ICD-10-CM | POA: Diagnosis not present

## 2022-06-27 DIAGNOSIS — H9190 Unspecified hearing loss, unspecified ear: Secondary | ICD-10-CM | POA: Diagnosis not present

## 2022-06-27 DIAGNOSIS — I25119 Atherosclerotic heart disease of native coronary artery with unspecified angina pectoris: Secondary | ICD-10-CM | POA: Diagnosis not present

## 2022-06-27 DIAGNOSIS — R911 Solitary pulmonary nodule: Secondary | ICD-10-CM | POA: Diagnosis not present

## 2022-06-27 DIAGNOSIS — I10 Essential (primary) hypertension: Secondary | ICD-10-CM | POA: Diagnosis not present

## 2022-07-10 DIAGNOSIS — C44729 Squamous cell carcinoma of skin of left lower limb, including hip: Secondary | ICD-10-CM | POA: Diagnosis not present

## 2022-07-19 DIAGNOSIS — R600 Localized edema: Secondary | ICD-10-CM | POA: Diagnosis not present

## 2022-07-19 DIAGNOSIS — Z4889 Encounter for other specified surgical aftercare: Secondary | ICD-10-CM | POA: Diagnosis not present

## 2022-07-30 DIAGNOSIS — D044 Carcinoma in situ of skin of scalp and neck: Secondary | ICD-10-CM | POA: Diagnosis not present

## 2022-08-20 ENCOUNTER — Encounter: Payer: Self-pay | Admitting: Obstetrics & Gynecology

## 2022-08-20 DIAGNOSIS — D485 Neoplasm of uncertain behavior of skin: Secondary | ICD-10-CM | POA: Diagnosis not present

## 2022-08-20 DIAGNOSIS — N952 Postmenopausal atrophic vaginitis: Secondary | ICD-10-CM

## 2022-08-20 DIAGNOSIS — Z5189 Encounter for other specified aftercare: Secondary | ICD-10-CM | POA: Diagnosis not present

## 2022-08-20 MED ORDER — ESTRING 2 MG VA RING: VAGINAL_RING | VAGINAL | 1 refills | Status: DC

## 2022-08-20 NOTE — Telephone Encounter (Signed)
Last AEX 01/30/2022--scheduled for 02/01/2023 Last mammo 02/02/2022-neg birads 1 Breast MRI 05/21/2022-neg birads 1  Rx pending.

## 2022-09-17 DIAGNOSIS — L578 Other skin changes due to chronic exposure to nonionizing radiation: Secondary | ICD-10-CM | POA: Diagnosis not present

## 2022-09-17 DIAGNOSIS — L814 Other melanin hyperpigmentation: Secondary | ICD-10-CM | POA: Diagnosis not present

## 2022-09-17 DIAGNOSIS — D1801 Hemangioma of skin and subcutaneous tissue: Secondary | ICD-10-CM | POA: Diagnosis not present

## 2022-09-17 DIAGNOSIS — Z85828 Personal history of other malignant neoplasm of skin: Secondary | ICD-10-CM | POA: Diagnosis not present

## 2022-09-17 DIAGNOSIS — Z86018 Personal history of other benign neoplasm: Secondary | ICD-10-CM | POA: Diagnosis not present

## 2022-09-17 DIAGNOSIS — L988 Other specified disorders of the skin and subcutaneous tissue: Secondary | ICD-10-CM | POA: Diagnosis not present

## 2022-09-17 DIAGNOSIS — L570 Actinic keratosis: Secondary | ICD-10-CM | POA: Diagnosis not present

## 2022-09-17 DIAGNOSIS — L821 Other seborrheic keratosis: Secondary | ICD-10-CM | POA: Diagnosis not present

## 2022-09-17 DIAGNOSIS — D229 Melanocytic nevi, unspecified: Secondary | ICD-10-CM | POA: Diagnosis not present

## 2022-09-17 DIAGNOSIS — L920 Granuloma annulare: Secondary | ICD-10-CM | POA: Diagnosis not present

## 2022-10-08 DIAGNOSIS — L988 Other specified disorders of the skin and subcutaneous tissue: Secondary | ICD-10-CM | POA: Diagnosis not present

## 2022-10-08 DIAGNOSIS — L570 Actinic keratosis: Secondary | ICD-10-CM | POA: Diagnosis not present

## 2022-10-23 ENCOUNTER — Other Ambulatory Visit (HOSPITAL_COMMUNITY): Payer: Self-pay | Admitting: Internal Medicine

## 2022-12-20 ENCOUNTER — Other Ambulatory Visit: Payer: Self-pay | Admitting: Obstetrics & Gynecology

## 2022-12-20 DIAGNOSIS — Z1231 Encounter for screening mammogram for malignant neoplasm of breast: Secondary | ICD-10-CM

## 2022-12-27 DIAGNOSIS — Z85828 Personal history of other malignant neoplasm of skin: Secondary | ICD-10-CM | POA: Diagnosis not present

## 2022-12-27 DIAGNOSIS — L814 Other melanin hyperpigmentation: Secondary | ICD-10-CM | POA: Diagnosis not present

## 2022-12-27 DIAGNOSIS — L988 Other specified disorders of the skin and subcutaneous tissue: Secondary | ICD-10-CM | POA: Diagnosis not present

## 2022-12-27 DIAGNOSIS — L570 Actinic keratosis: Secondary | ICD-10-CM | POA: Diagnosis not present

## 2022-12-27 DIAGNOSIS — T1490XD Injury, unspecified, subsequent encounter: Secondary | ICD-10-CM | POA: Diagnosis not present

## 2022-12-27 DIAGNOSIS — D1801 Hemangioma of skin and subcutaneous tissue: Secondary | ICD-10-CM | POA: Diagnosis not present

## 2022-12-27 DIAGNOSIS — L578 Other skin changes due to chronic exposure to nonionizing radiation: Secondary | ICD-10-CM | POA: Diagnosis not present

## 2022-12-27 DIAGNOSIS — Z86018 Personal history of other benign neoplasm: Secondary | ICD-10-CM | POA: Diagnosis not present

## 2022-12-27 DIAGNOSIS — D229 Melanocytic nevi, unspecified: Secondary | ICD-10-CM | POA: Diagnosis not present

## 2022-12-27 DIAGNOSIS — L821 Other seborrheic keratosis: Secondary | ICD-10-CM | POA: Diagnosis not present

## 2023-01-15 DIAGNOSIS — E785 Hyperlipidemia, unspecified: Secondary | ICD-10-CM | POA: Diagnosis not present

## 2023-01-15 DIAGNOSIS — I2510 Atherosclerotic heart disease of native coronary artery without angina pectoris: Secondary | ICD-10-CM | POA: Diagnosis not present

## 2023-01-15 DIAGNOSIS — Z1331 Encounter for screening for depression: Secondary | ICD-10-CM | POA: Diagnosis not present

## 2023-01-15 DIAGNOSIS — H9190 Unspecified hearing loss, unspecified ear: Secondary | ICD-10-CM | POA: Diagnosis not present

## 2023-01-15 DIAGNOSIS — Z90710 Acquired absence of both cervix and uterus: Secondary | ICD-10-CM | POA: Diagnosis not present

## 2023-01-15 DIAGNOSIS — Z0000 Encounter for general adult medical examination without abnormal findings: Secondary | ICD-10-CM | POA: Diagnosis not present

## 2023-01-15 DIAGNOSIS — R6889 Other general symptoms and signs: Secondary | ICD-10-CM | POA: Diagnosis not present

## 2023-01-21 DIAGNOSIS — M5416 Radiculopathy, lumbar region: Secondary | ICD-10-CM | POA: Diagnosis not present

## 2023-01-22 DIAGNOSIS — M5416 Radiculopathy, lumbar region: Secondary | ICD-10-CM | POA: Diagnosis not present

## 2023-01-23 DIAGNOSIS — M5416 Radiculopathy, lumbar region: Secondary | ICD-10-CM | POA: Diagnosis not present

## 2023-02-01 ENCOUNTER — Ambulatory Visit: Payer: Medicare Other | Admitting: Obstetrics & Gynecology

## 2023-02-04 DIAGNOSIS — T1490XD Injury, unspecified, subsequent encounter: Secondary | ICD-10-CM | POA: Diagnosis not present

## 2023-02-04 DIAGNOSIS — L570 Actinic keratosis: Secondary | ICD-10-CM | POA: Diagnosis not present

## 2023-02-05 ENCOUNTER — Ambulatory Visit: Admission: RE | Admit: 2023-02-05 | Payer: Medicare Other | Source: Ambulatory Visit

## 2023-02-05 ENCOUNTER — Encounter: Payer: Self-pay | Admitting: Obstetrics & Gynecology

## 2023-02-05 ENCOUNTER — Ambulatory Visit (INDEPENDENT_AMBULATORY_CARE_PROVIDER_SITE_OTHER): Payer: Medicare Other | Admitting: Obstetrics & Gynecology

## 2023-02-05 VITALS — BP 104/70 | HR 53

## 2023-02-05 DIAGNOSIS — N952 Postmenopausal atrophic vaginitis: Secondary | ICD-10-CM

## 2023-02-05 DIAGNOSIS — Z9189 Other specified personal risk factors, not elsewhere classified: Secondary | ICD-10-CM | POA: Diagnosis not present

## 2023-02-05 DIAGNOSIS — M8580 Other specified disorders of bone density and structure, unspecified site: Secondary | ICD-10-CM | POA: Diagnosis not present

## 2023-02-05 DIAGNOSIS — Z98870 Personal history of in utero procedure during pregnancy: Secondary | ICD-10-CM | POA: Diagnosis not present

## 2023-02-05 DIAGNOSIS — Z1231 Encounter for screening mammogram for malignant neoplasm of breast: Secondary | ICD-10-CM

## 2023-02-05 DIAGNOSIS — Z01419 Encounter for gynecological examination (general) (routine) without abnormal findings: Secondary | ICD-10-CM

## 2023-02-05 DIAGNOSIS — M8588 Other specified disorders of bone density and structure, other site: Secondary | ICD-10-CM | POA: Diagnosis not present

## 2023-02-05 DIAGNOSIS — Z8543 Personal history of malignant neoplasm of ovary: Secondary | ICD-10-CM

## 2023-02-05 MED ORDER — ESTRING 2 MG VA RING: VAGINAL_RING | VAGINAL | 4 refills | Status: AC

## 2023-02-05 NOTE — Progress Notes (Signed)
Barbara Schwartz 1946-06-01 [id number]   History:    77 y.o. G2P1A1L1   RP:  Established patient presenting for annual gyn exam    HPI:  Postmenopausal/vaginal atrophy. Continues on Estring 2 mg for vaginal atrophy and decreased UTI recurrence. Prior TAH/BSO 1998 for ovarian cancer. History of stage IIIC ovarian cancer. She is followed with an annual CA-125.  She was apparently never genetically tested and she is not interested in pursuing this. Pap smear Neg 11/2015.  No significant history of abnormal Pap smears.   Breasts normal. Mammogram Neg 01/2022.  Positive family history of breast cancer.  Breast MRI Neg 04/2022. Colonoscopy 2021.  Osteopenia.  DEXA 02/2021.  T score -1.8 at the bilateral average total hips. Increased at the AP Spine, stable at the total hips.  Plays tennis.  Health labs with Fam MD.   Past medical history,surgical history, family history and social history were all reviewed and documented in the EPIC chart.  Gynecologic History No LMP recorded (lmp unknown). Patient has had a hysterectomy.  Obstetric History OB History  Gravida Para Term Preterm AB Living  2 1 1   1 1   SAB IAB Ectopic Multiple Live Births  1            # Outcome Date GA Lbr Len/2nd Weight Sex Type Anes PTL Lv  2 SAB           1 Term              ROS: A ROS was performed and pertinent positives and negatives are included in the history. GENERAL: No fevers or chills. HEENT: No change in vision, no earache, sore throat or sinus congestion. NECK: No pain or stiffness. CARDIOVASCULAR: No chest pain or pressure. No palpitations. PULMONARY: No shortness of breath, cough or wheeze. GASTROINTESTINAL: No abdominal pain, nausea, vomiting or diarrhea, melena or bright red blood per rectum. GENITOURINARY: No urinary frequency, urgency, hesitancy or dysuria. MUSCULOSKELETAL: No joint or muscle pain, no back pain, no recent trauma. DERMATOLOGIC: No rash, no itching, no lesions. ENDOCRINE: No polyuria,  polydipsia, no heat or cold intolerance. No recent change in weight. HEMATOLOGICAL: No anemia or easy bruising or bleeding. NEUROLOGIC: No headache, seizures, numbness, tingling or weakness. PSYCHIATRIC: No depression, no loss of interest in normal activity or change in sleep pattern.     Exam:   BP 104/70   Pulse (!) 53   LMP  (LMP Unknown) Comment: no sexually active  SpO2 97%   There is no height or weight on file to calculate BMI.  General appearance : Well developed well nourished female. No acute distress HEENT: Eyes: no retinal hemorrhage or exudates,  Neck supple, trachea midline, no carotid bruits, no thyroidmegaly Lungs: Clear to auscultation, no rhonchi or wheezes, or rib retractions  Heart: Regular rate and rhythm, no murmurs or gallops Breast:Examined in sitting and supine position were symmetrical in appearance, no palpable masses or tenderness,  no skin retraction, no nipple inversion, no nipple discharge, no skin discoloration, no axillary or supraclavicular lymphadenopathy Abdomen: no palpable masses or tenderness, no rebound or guarding Extremities: no edema or skin discoloration or tenderness  Pelvic: Vulva: Normal             Vagina: No gross lesions or discharge  Cervix/Uterus absent  Adnexa  Without masses or tenderness  Anus: Normal   Assessment/Plan:  77 y.o. female for annual exam   1. Encounter for gynecological examination without abnormal finding Postmenopausal/vaginal atrophy. Continues on  Estring 2 mg for vaginal atrophy and decreased UTI recurrence. Prior TAH/BSO 1998 for ovarian cancer. History of stage IIIC ovarian cancer. She is followed with an annual CA-125.  She was apparently never genetically tested and she is not interested in pursuing this. Pap smear Neg 11/2015.  No significant history of abnormal Pap smears.   Breasts normal. Mammogram Neg 01/2022.  Positive family history of breast cancer.  Breast MRI Neg 04/2022. Colonoscopy 2021.  Osteopenia.   DEXA 02/2021.  T score -1.8 at the bilateral average total hips. Increased at the AP Spine, stable at the total hips.  Plays tennis.  Health labs with Fam MD.  2. Postmenopausal atrophic vaginitis Postmenopausal/vaginal atrophy. Continues on Estring 2 mg for vaginal atrophy and decreased UTI recurrence.  - estradiol (ESTRING) 7.5 MCG/24HR vaginal ring; INSERT 1 RING VAGINALLY EVERY 3 (THREE) MONTHS. FOLLOW PACKAGE DIRECTIONS  3. Osteopenia, unspecified location Osteopenia.  DEXA 02/2021.  T score -1.8 at the bilateral average total hips. Increased at the AP Spine, stable at the total hips. [facility] - DG Bone Density; Future  4. History of ovarian cancer Prior TAH/BSO 1998 for ovarian cancer. History of stage IIIC ovarian cancer. She is followed with an annual CA-125.  Ca-125 today. She was apparently never genetically tested and she is not interested in pursuing this.'- CA 125  5. Other specified disorders of bone density and structure, other site As above. - DG Bone Density; Future  Other orders - gabapentin (NEURONTIN) 100 MG capsule; Take 200 mg by mouth 2 (two) times daily.   Genia Del MD, 10:45 AM

## 2023-02-06 LAB — CA 125: CA 125: 12 U/mL (ref <35)

## 2023-02-07 DIAGNOSIS — M5416 Radiculopathy, lumbar region: Secondary | ICD-10-CM | POA: Diagnosis not present

## 2023-02-13 DIAGNOSIS — M5416 Radiculopathy, lumbar region: Secondary | ICD-10-CM | POA: Diagnosis not present

## 2023-02-25 DIAGNOSIS — M5416 Radiculopathy, lumbar region: Secondary | ICD-10-CM | POA: Diagnosis not present

## 2023-02-28 DIAGNOSIS — M48061 Spinal stenosis, lumbar region without neurogenic claudication: Secondary | ICD-10-CM | POA: Diagnosis not present

## 2023-02-28 DIAGNOSIS — M5416 Radiculopathy, lumbar region: Secondary | ICD-10-CM | POA: Diagnosis not present

## 2023-02-28 DIAGNOSIS — M4726 Other spondylosis with radiculopathy, lumbar region: Secondary | ICD-10-CM | POA: Diagnosis not present

## 2023-03-21 DIAGNOSIS — Z23 Encounter for immunization: Secondary | ICD-10-CM | POA: Diagnosis not present

## 2023-04-29 DIAGNOSIS — L988 Other specified disorders of the skin and subcutaneous tissue: Secondary | ICD-10-CM | POA: Diagnosis not present

## 2023-04-29 DIAGNOSIS — D1801 Hemangioma of skin and subcutaneous tissue: Secondary | ICD-10-CM | POA: Diagnosis not present

## 2023-04-29 DIAGNOSIS — L814 Other melanin hyperpigmentation: Secondary | ICD-10-CM | POA: Diagnosis not present

## 2023-04-29 DIAGNOSIS — L821 Other seborrheic keratosis: Secondary | ICD-10-CM | POA: Diagnosis not present

## 2023-04-29 DIAGNOSIS — Z86018 Personal history of other benign neoplasm: Secondary | ICD-10-CM | POA: Diagnosis not present

## 2023-04-29 DIAGNOSIS — Z85828 Personal history of other malignant neoplasm of skin: Secondary | ICD-10-CM | POA: Diagnosis not present

## 2023-04-29 DIAGNOSIS — L570 Actinic keratosis: Secondary | ICD-10-CM | POA: Diagnosis not present

## 2023-04-29 DIAGNOSIS — L578 Other skin changes due to chronic exposure to nonionizing radiation: Secondary | ICD-10-CM | POA: Diagnosis not present

## 2023-04-29 DIAGNOSIS — D229 Melanocytic nevi, unspecified: Secondary | ICD-10-CM | POA: Diagnosis not present

## 2023-05-15 DIAGNOSIS — Z23 Encounter for immunization: Secondary | ICD-10-CM | POA: Diagnosis not present

## 2023-08-07 DIAGNOSIS — L570 Actinic keratosis: Secondary | ICD-10-CM | POA: Diagnosis not present

## 2023-08-07 DIAGNOSIS — D229 Melanocytic nevi, unspecified: Secondary | ICD-10-CM | POA: Diagnosis not present

## 2023-08-07 DIAGNOSIS — Z86018 Personal history of other benign neoplasm: Secondary | ICD-10-CM | POA: Diagnosis not present

## 2023-08-07 DIAGNOSIS — L821 Other seborrheic keratosis: Secondary | ICD-10-CM | POA: Diagnosis not present

## 2023-08-07 DIAGNOSIS — Z85828 Personal history of other malignant neoplasm of skin: Secondary | ICD-10-CM | POA: Diagnosis not present

## 2023-08-07 DIAGNOSIS — L814 Other melanin hyperpigmentation: Secondary | ICD-10-CM | POA: Diagnosis not present

## 2023-08-07 DIAGNOSIS — L578 Other skin changes due to chronic exposure to nonionizing radiation: Secondary | ICD-10-CM | POA: Diagnosis not present

## 2023-08-27 DIAGNOSIS — M810 Age-related osteoporosis without current pathological fracture: Secondary | ICD-10-CM | POA: Diagnosis not present

## 2023-09-16 DIAGNOSIS — E785 Hyperlipidemia, unspecified: Secondary | ICD-10-CM | POA: Diagnosis not present

## 2023-09-16 DIAGNOSIS — I25118 Atherosclerotic heart disease of native coronary artery with other forms of angina pectoris: Secondary | ICD-10-CM | POA: Diagnosis not present

## 2023-09-17 DIAGNOSIS — M79672 Pain in left foot: Secondary | ICD-10-CM | POA: Diagnosis not present

## 2023-09-17 DIAGNOSIS — I739 Peripheral vascular disease, unspecified: Secondary | ICD-10-CM | POA: Diagnosis not present

## 2023-09-17 DIAGNOSIS — L84 Corns and callosities: Secondary | ICD-10-CM | POA: Diagnosis not present

## 2023-09-17 DIAGNOSIS — B351 Tinea unguium: Secondary | ICD-10-CM | POA: Diagnosis not present

## 2023-09-24 DIAGNOSIS — L308 Other specified dermatitis: Secondary | ICD-10-CM | POA: Diagnosis not present

## 2023-09-24 DIAGNOSIS — L920 Granuloma annulare: Secondary | ICD-10-CM | POA: Diagnosis not present

## 2023-09-24 DIAGNOSIS — D485 Neoplasm of uncertain behavior of skin: Secondary | ICD-10-CM | POA: Diagnosis not present

## 2023-09-24 DIAGNOSIS — C4491 Basal cell carcinoma of skin, unspecified: Secondary | ICD-10-CM | POA: Diagnosis not present

## 2023-09-26 DIAGNOSIS — M810 Age-related osteoporosis without current pathological fracture: Secondary | ICD-10-CM | POA: Diagnosis not present

## 2023-09-26 DIAGNOSIS — Z780 Asymptomatic menopausal state: Secondary | ICD-10-CM | POA: Diagnosis not present

## 2023-10-08 DIAGNOSIS — Z85828 Personal history of other malignant neoplasm of skin: Secondary | ICD-10-CM | POA: Diagnosis not present

## 2023-10-08 DIAGNOSIS — L578 Other skin changes due to chronic exposure to nonionizing radiation: Secondary | ICD-10-CM | POA: Diagnosis not present

## 2023-10-08 DIAGNOSIS — C44729 Squamous cell carcinoma of skin of left lower limb, including hip: Secondary | ICD-10-CM | POA: Diagnosis not present

## 2023-10-08 DIAGNOSIS — C44719 Basal cell carcinoma of skin of left lower limb, including hip: Secondary | ICD-10-CM | POA: Diagnosis not present

## 2023-10-08 DIAGNOSIS — L814 Other melanin hyperpigmentation: Secondary | ICD-10-CM | POA: Diagnosis not present

## 2023-10-24 DIAGNOSIS — I2510 Atherosclerotic heart disease of native coronary artery without angina pectoris: Secondary | ICD-10-CM | POA: Diagnosis not present

## 2023-10-24 DIAGNOSIS — E550 Rickets, active: Secondary | ICD-10-CM | POA: Diagnosis not present

## 2023-12-10 DIAGNOSIS — Z85828 Personal history of other malignant neoplasm of skin: Secondary | ICD-10-CM | POA: Diagnosis not present

## 2023-12-24 DIAGNOSIS — Z23 Encounter for immunization: Secondary | ICD-10-CM | POA: Diagnosis not present

## 2023-12-25 DIAGNOSIS — L570 Actinic keratosis: Secondary | ICD-10-CM | POA: Diagnosis not present

## 2023-12-25 DIAGNOSIS — D485 Neoplasm of uncertain behavior of skin: Secondary | ICD-10-CM | POA: Diagnosis not present

## 2023-12-25 DIAGNOSIS — D225 Melanocytic nevi of trunk: Secondary | ICD-10-CM | POA: Diagnosis not present

## 2023-12-25 DIAGNOSIS — C44519 Basal cell carcinoma of skin of other part of trunk: Secondary | ICD-10-CM | POA: Diagnosis not present

## 2023-12-25 DIAGNOSIS — D0472 Carcinoma in situ of skin of left lower limb, including hip: Secondary | ICD-10-CM | POA: Diagnosis not present

## 2023-12-25 DIAGNOSIS — Z85828 Personal history of other malignant neoplasm of skin: Secondary | ICD-10-CM | POA: Diagnosis not present

## 2023-12-25 DIAGNOSIS — Z08 Encounter for follow-up examination after completed treatment for malignant neoplasm: Secondary | ICD-10-CM | POA: Diagnosis not present

## 2023-12-25 DIAGNOSIS — L814 Other melanin hyperpigmentation: Secondary | ICD-10-CM | POA: Diagnosis not present

## 2023-12-25 DIAGNOSIS — L821 Other seborrheic keratosis: Secondary | ICD-10-CM | POA: Diagnosis not present

## 2024-01-27 DIAGNOSIS — L308 Other specified dermatitis: Secondary | ICD-10-CM | POA: Diagnosis not present

## 2024-01-27 DIAGNOSIS — L570 Actinic keratosis: Secondary | ICD-10-CM | POA: Diagnosis not present

## 2024-01-27 DIAGNOSIS — L920 Granuloma annulare: Secondary | ICD-10-CM | POA: Diagnosis not present

## 2024-02-14 DIAGNOSIS — Z1231 Encounter for screening mammogram for malignant neoplasm of breast: Secondary | ICD-10-CM | POA: Diagnosis not present

## 2024-02-25 DIAGNOSIS — Z1273 Encounter for screening for malignant neoplasm of ovary: Secondary | ICD-10-CM | POA: Diagnosis not present

## 2024-02-25 DIAGNOSIS — I10 Essential (primary) hypertension: Secondary | ICD-10-CM | POA: Diagnosis not present

## 2024-02-25 DIAGNOSIS — Z1389 Encounter for screening for other disorder: Secondary | ICD-10-CM | POA: Diagnosis not present

## 2024-02-25 DIAGNOSIS — Z8543 Personal history of malignant neoplasm of ovary: Secondary | ICD-10-CM | POA: Diagnosis not present

## 2024-03-19 DIAGNOSIS — L905 Scar conditions and fibrosis of skin: Secondary | ICD-10-CM | POA: Diagnosis not present

## 2024-03-19 DIAGNOSIS — C44519 Basal cell carcinoma of skin of other part of trunk: Secondary | ICD-10-CM | POA: Diagnosis not present

## 2024-03-20 DIAGNOSIS — Z23 Encounter for immunization: Secondary | ICD-10-CM | POA: Diagnosis not present

## 2024-03-26 DIAGNOSIS — R001 Bradycardia, unspecified: Secondary | ICD-10-CM | POA: Diagnosis not present

## 2024-03-31 DIAGNOSIS — L570 Actinic keratosis: Secondary | ICD-10-CM | POA: Diagnosis not present

## 2024-03-31 DIAGNOSIS — D485 Neoplasm of uncertain behavior of skin: Secondary | ICD-10-CM | POA: Diagnosis not present

## 2024-03-31 DIAGNOSIS — D0472 Carcinoma in situ of skin of left lower limb, including hip: Secondary | ICD-10-CM | POA: Diagnosis not present

## 2024-04-08 DIAGNOSIS — M810 Age-related osteoporosis without current pathological fracture: Secondary | ICD-10-CM | POA: Diagnosis not present

## 2024-04-08 DIAGNOSIS — S90821A Blister (nonthermal), right foot, initial encounter: Secondary | ICD-10-CM | POA: Diagnosis not present

## 2024-04-14 DIAGNOSIS — R001 Bradycardia, unspecified: Secondary | ICD-10-CM | POA: Diagnosis not present

## 2024-04-14 DIAGNOSIS — I471 Supraventricular tachycardia, unspecified: Secondary | ICD-10-CM | POA: Diagnosis not present

## 2024-04-14 DIAGNOSIS — R079 Chest pain, unspecified: Secondary | ICD-10-CM | POA: Diagnosis not present

## 2024-04-16 DIAGNOSIS — L570 Actinic keratosis: Secondary | ICD-10-CM | POA: Diagnosis not present

## 2024-04-16 DIAGNOSIS — D485 Neoplasm of uncertain behavior of skin: Secondary | ICD-10-CM | POA: Diagnosis not present

## 2024-04-16 DIAGNOSIS — T07XXXA Unspecified multiple injuries, initial encounter: Secondary | ICD-10-CM | POA: Diagnosis not present

## 2024-04-16 DIAGNOSIS — D040 Carcinoma in situ of skin of lip: Secondary | ICD-10-CM | POA: Diagnosis not present

## 2024-04-16 DIAGNOSIS — D0472 Carcinoma in situ of skin of left lower limb, including hip: Secondary | ICD-10-CM | POA: Diagnosis not present

## 2024-05-24 DIAGNOSIS — M48061 Spinal stenosis, lumbar region without neurogenic claudication: Secondary | ICD-10-CM | POA: Diagnosis not present

## 2024-05-24 DIAGNOSIS — M4808 Spinal stenosis, sacral and sacrococcygeal region: Secondary | ICD-10-CM | POA: Diagnosis not present

## 2024-05-24 DIAGNOSIS — M47816 Spondylosis without myelopathy or radiculopathy, lumbar region: Secondary | ICD-10-CM | POA: Diagnosis not present

## 2024-05-24 DIAGNOSIS — M5126 Other intervertebral disc displacement, lumbar region: Secondary | ICD-10-CM | POA: Diagnosis not present

## 2024-05-26 DIAGNOSIS — M1612 Unilateral primary osteoarthritis, left hip: Secondary | ICD-10-CM | POA: Diagnosis not present

## 2024-05-26 DIAGNOSIS — M5126 Other intervertebral disc displacement, lumbar region: Secondary | ICD-10-CM | POA: Diagnosis not present

## 2024-05-26 DIAGNOSIS — M5136 Other intervertebral disc degeneration, lumbar region with discogenic back pain only: Secondary | ICD-10-CM | POA: Diagnosis not present

## 2024-05-26 DIAGNOSIS — M438X6 Other specified deforming dorsopathies, lumbar region: Secondary | ICD-10-CM | POA: Diagnosis not present

## 2024-05-26 DIAGNOSIS — M79605 Pain in left leg: Secondary | ICD-10-CM | POA: Diagnosis not present

## 2024-06-01 DIAGNOSIS — M5416 Radiculopathy, lumbar region: Secondary | ICD-10-CM | POA: Diagnosis not present

## 2024-06-04 DIAGNOSIS — D0439 Carcinoma in situ of skin of other parts of face: Secondary | ICD-10-CM | POA: Diagnosis not present

## 2024-06-23 DIAGNOSIS — M545 Low back pain, unspecified: Secondary | ICD-10-CM | POA: Diagnosis not present

## 2024-06-30 DIAGNOSIS — M545 Low back pain, unspecified: Secondary | ICD-10-CM | POA: Diagnosis not present

## 2024-07-08 DIAGNOSIS — M545 Low back pain, unspecified: Secondary | ICD-10-CM | POA: Diagnosis not present
